# Patient Record
Sex: Male | Born: 1960 | Race: Black or African American | Hispanic: No | Marital: Married | Smoking: Current every day smoker
Health system: Southern US, Community
[De-identification: ages and names within clinical notes are randomized; demographics above are authoritative.]

## PROBLEM LIST (undated history)

## (undated) DIAGNOSIS — F1021 Alcohol dependence, in remission: Secondary | ICD-10-CM

## (undated) DIAGNOSIS — M1990 Unspecified osteoarthritis, unspecified site: Secondary | ICD-10-CM

## (undated) DIAGNOSIS — I499 Cardiac arrhythmia, unspecified: Secondary | ICD-10-CM

## (undated) DIAGNOSIS — R918 Other nonspecific abnormal finding of lung field: Secondary | ICD-10-CM

## (undated) DIAGNOSIS — Z720 Tobacco use: Secondary | ICD-10-CM

## (undated) DIAGNOSIS — I4892 Unspecified atrial flutter: Secondary | ICD-10-CM

## (undated) HISTORY — DX: Alcohol dependence, in remission: F10.21

## (undated) HISTORY — DX: Other nonspecific abnormal finding of lung field: R91.8

## (undated) HISTORY — DX: Tobacco use: Z72.0

## (undated) HISTORY — DX: Unspecified atrial flutter: I48.92

---

## 2008-04-30 ENCOUNTER — Emergency Department (HOSPITAL_COMMUNITY): Admission: EM | Admit: 2008-04-30 | Discharge: 2008-04-30 | Payer: Self-pay | Admitting: Emergency Medicine

## 2008-04-30 IMAGING — CR DG FINGER INDEX 2+V*R*
1 series · 1 of 1 positions shown · non-contrast
Comparison: None

CLINICAL DATA: Laceration

RIGHT INDEX FINGER 2+V

[view not recorded]
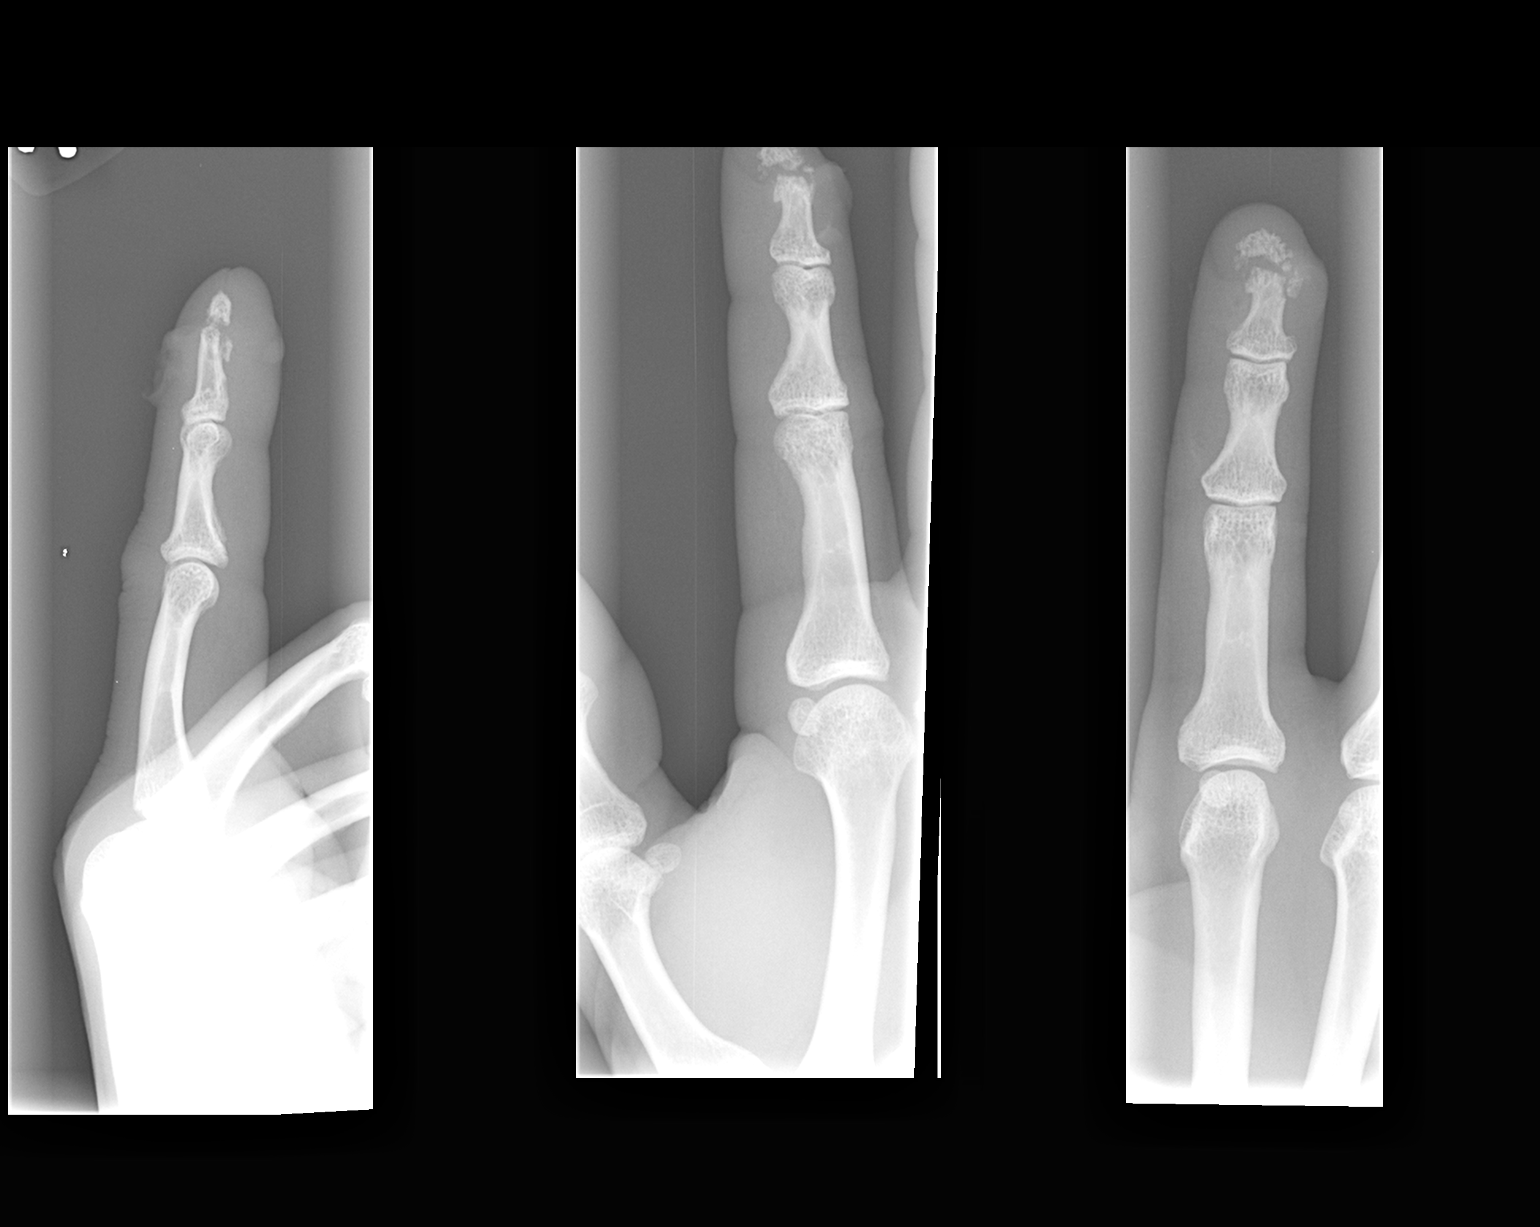

[1 of 1 positions shown; findings below may reference images not displayed]

FINDINGS: There is a comminuted distal tuft fracture and associated
soft tissue injury.  The joint spaces are maintained.
IMPRESSION: Comminuted distal tuft fracture.

## 2008-09-18 ENCOUNTER — Ambulatory Visit (HOSPITAL_COMMUNITY): Admission: RE | Admit: 2008-09-18 | Discharge: 2008-09-18 | Payer: Self-pay | Admitting: Family Medicine

## 2008-09-18 IMAGING — CR DG CHEST 2V
2 series · 2 of 2 positions shown · non-contrast
Comparison: None

CLINICAL DATA: Chronic tobacco use

CHEST - 2 VIEW

[view not recorded (1 of 2)]
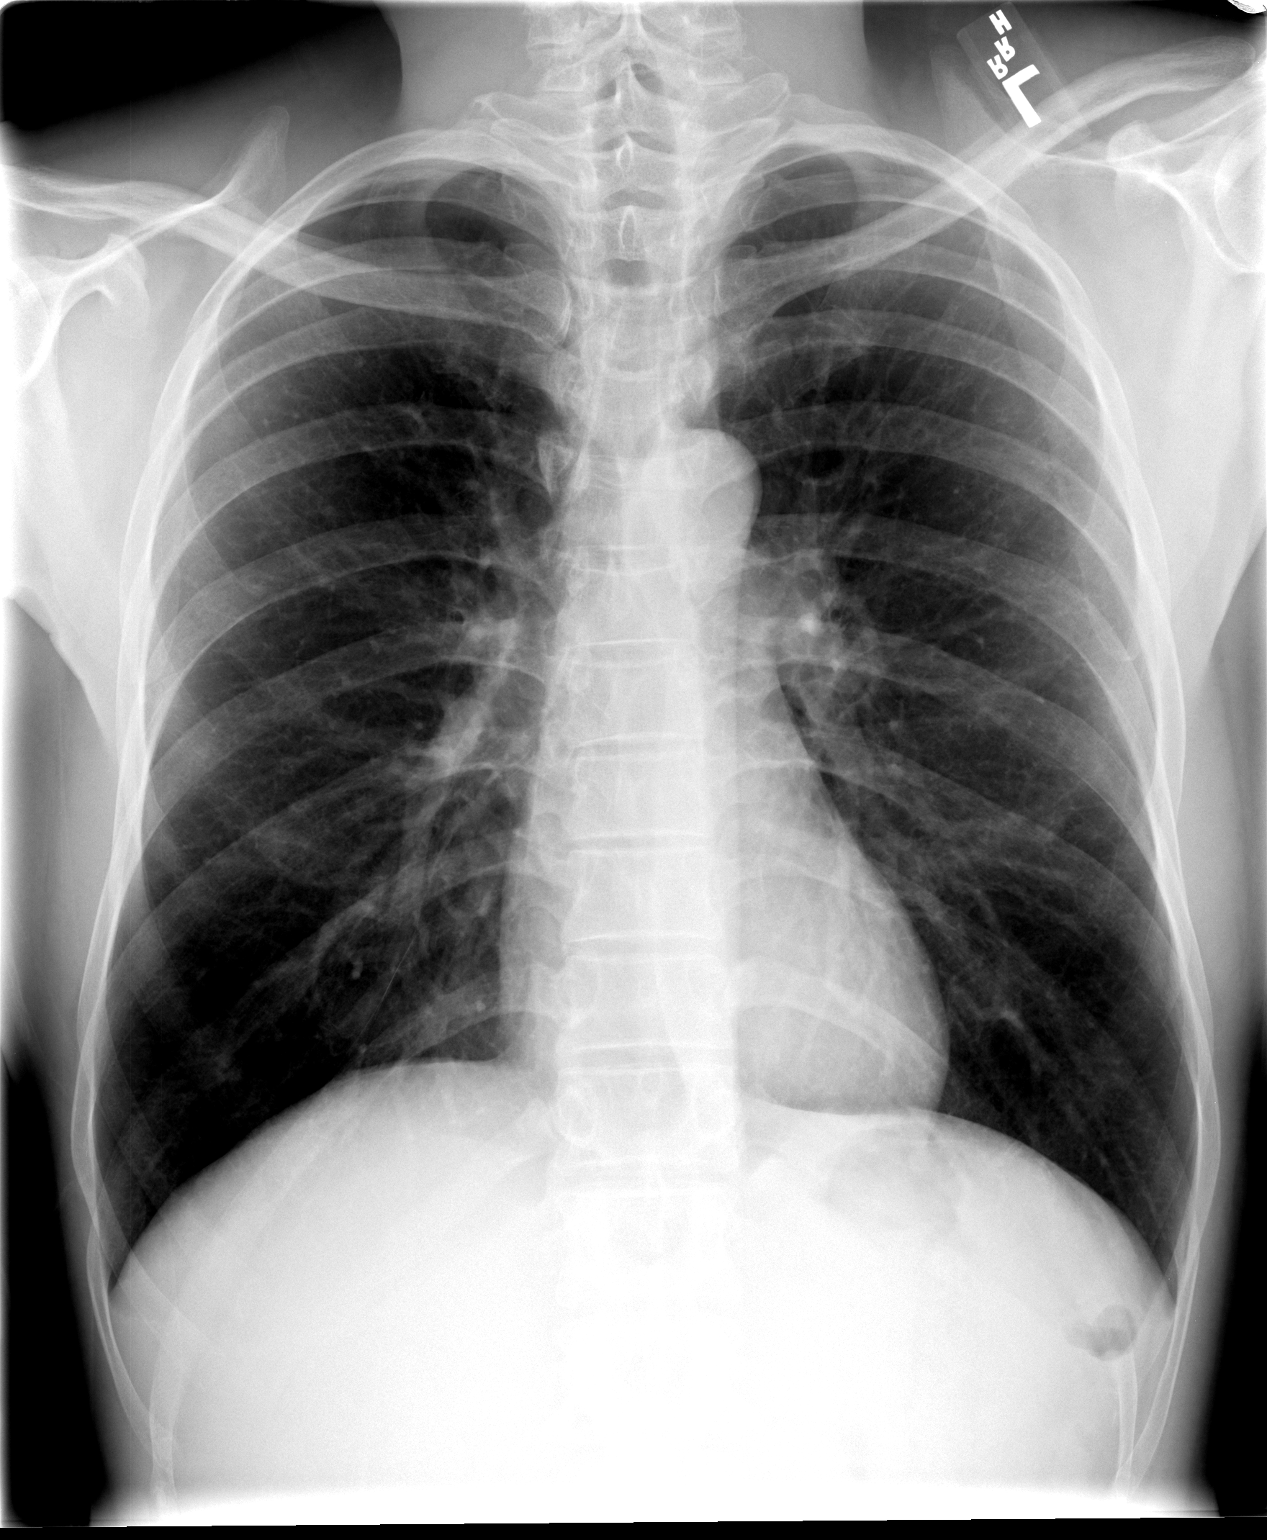

[view not recorded (2 of 2)]
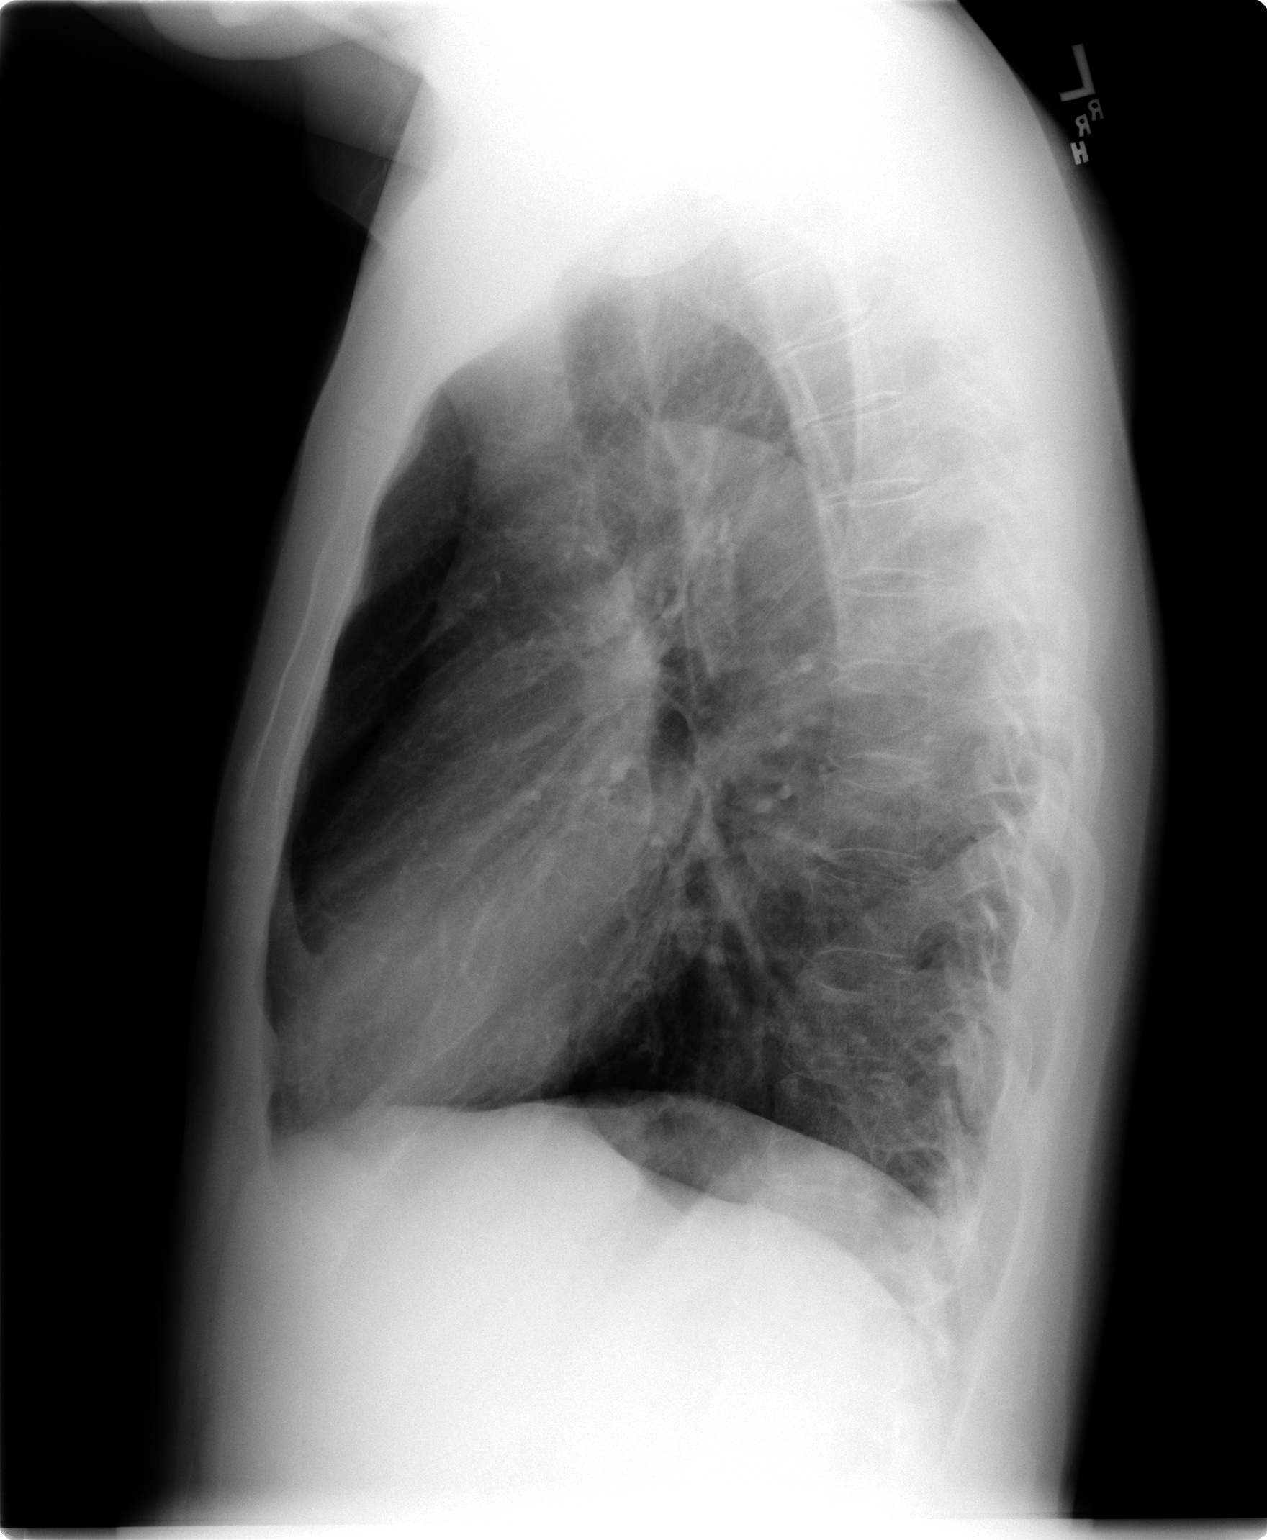

[2 of 2 positions shown; findings below may reference images not displayed]

FINDINGS: Normal heart size, mediastinal contours, and pulmonary vascularity.
Question bilateral nipple shadows.
Bronchitic and minimal emphysematous changes suggesting early COPD.
No pulmonary infiltrate or pleural effusion.
Bones unremarkable.
IMPRESSION: Question early COPD.
Question bilateral nipple shadows, recommend repeat PA chest
radiograph with nipple markers to confirm, in order to exclude
pulmonary nodule.

## 2008-09-28 ENCOUNTER — Ambulatory Visit (HOSPITAL_COMMUNITY): Admission: RE | Admit: 2008-09-28 | Discharge: 2008-09-28 | Payer: Self-pay | Admitting: Family Medicine

## 2008-09-28 IMAGING — CR DG CHEST 1V
1 series · 1 of 1 positions shown · non-contrast
Comparison: Chest radiograph [DATE]

CLINICAL DATA: Evaluate for pulmonary nodules.

CHEST - 1 VIEW

[view not recorded]
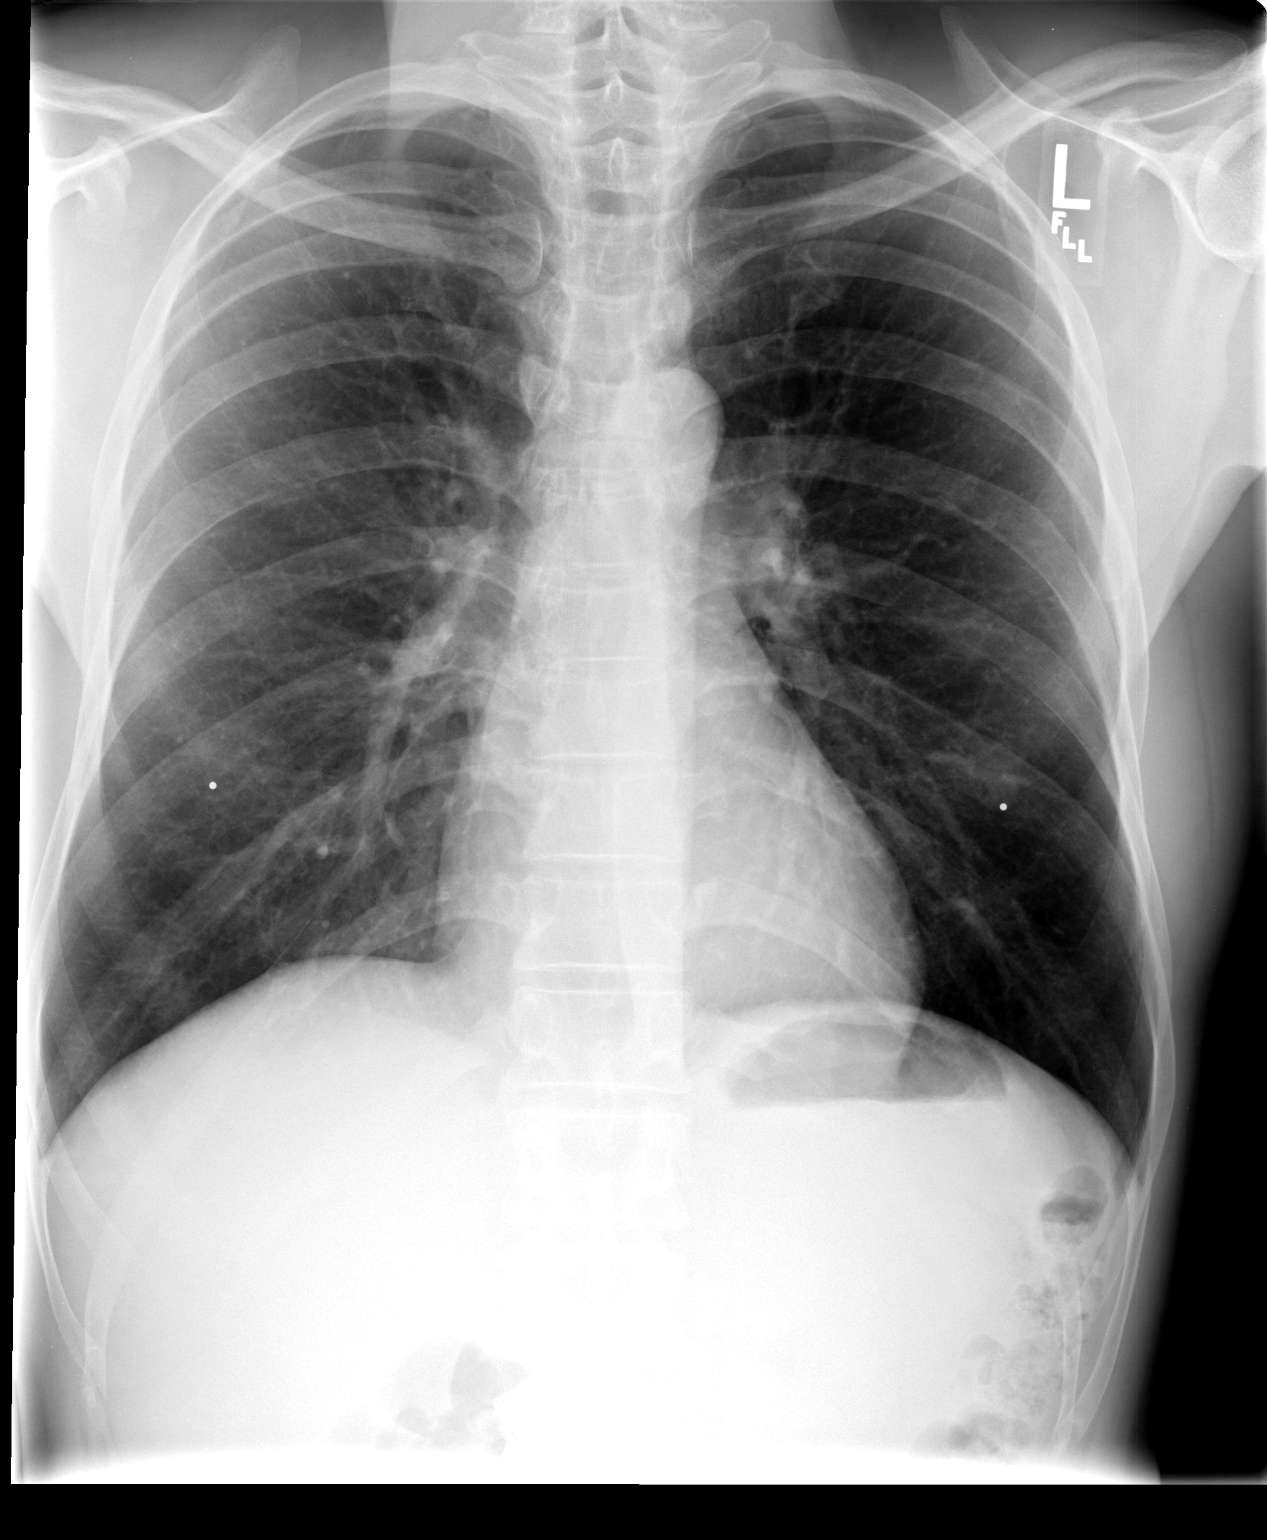

[1 of 1 positions shown; findings below may reference images not displayed]

FINDINGS: Frontal radiograph is performed with nipple markers.
Nipple marker location corresponds to the densities identified on
comparison chest radiograph.

Normal mediastinum and cardiac silhouette.  Costophrenic angles are
clear.  No evidence effusion, infiltrate, or pneumothorax.
IMPRESSION: Nipple shadows identified by nipple markers.

## 2009-04-06 ENCOUNTER — Emergency Department (HOSPITAL_COMMUNITY): Admission: EM | Admit: 2009-04-06 | Discharge: 2009-04-06 | Payer: Self-pay | Admitting: Emergency Medicine

## 2010-01-01 ENCOUNTER — Emergency Department (HOSPITAL_COMMUNITY): Admission: EM | Admit: 2010-01-01 | Discharge: 2010-01-01 | Payer: Self-pay | Admitting: Emergency Medicine

## 2010-08-17 LAB — RAPID URINE DRUG SCREEN, HOSP PERFORMED
Benzodiazepines: NOT DETECTED
Cocaine: NOT DETECTED
Opiates: NOT DETECTED
Tetrahydrocannabinol: NOT DETECTED

## 2010-08-17 LAB — ETHANOL: Alcohol, Ethyl (B): 231 mg/dL — ABNORMAL HIGH (ref 0–10)

## 2013-05-31 ENCOUNTER — Emergency Department (HOSPITAL_COMMUNITY)
Admission: EM | Admit: 2013-05-31 | Discharge: 2013-05-31 | Disposition: A | Discharge status: Home / Self Care | Payer: 59 | Attending: Emergency Medicine | Admitting: Emergency Medicine

## 2013-05-31 ENCOUNTER — Emergency Department (HOSPITAL_COMMUNITY): Payer: 59

## 2013-05-31 ENCOUNTER — Encounter (HOSPITAL_COMMUNITY): Payer: Self-pay | Admitting: Emergency Medicine

## 2013-05-31 DIAGNOSIS — Z79899 Other long term (current) drug therapy: Secondary | ICD-10-CM | POA: Insufficient documentation

## 2013-05-31 DIAGNOSIS — J40 Bronchitis, not specified as acute or chronic: Secondary | ICD-10-CM

## 2013-05-31 DIAGNOSIS — F17200 Nicotine dependence, unspecified, uncomplicated: Secondary | ICD-10-CM | POA: Insufficient documentation

## 2013-05-31 DIAGNOSIS — J209 Acute bronchitis, unspecified: Secondary | ICD-10-CM | POA: Insufficient documentation

## 2013-05-31 IMAGING — CR DG CHEST 2V
2 series · 2 of 2 positions shown · non-contrast
Comparison: Chest x-ray [DATE].

CLINICAL DATA: Productive cough and fever for the past 4 days.
Shortness of breath.

EXAM:
CHEST  2 VIEW

[view not recorded (1 of 2)]
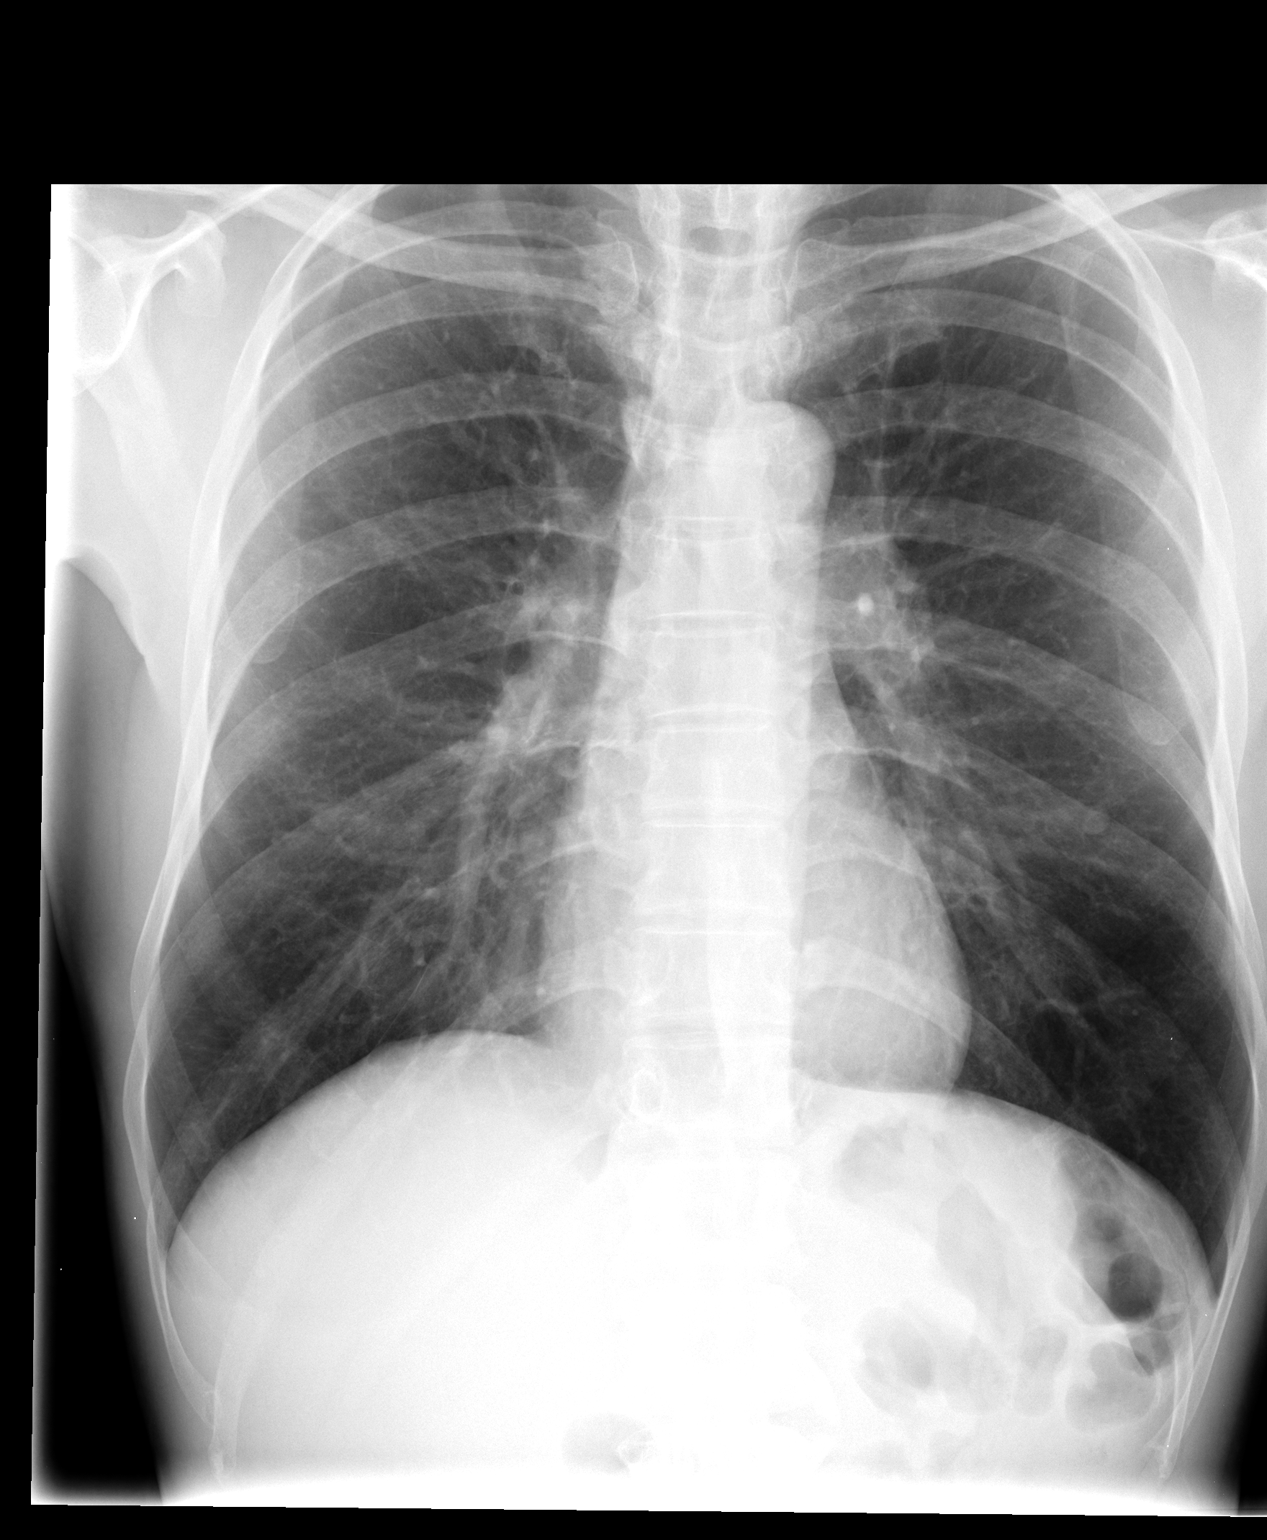

[view not recorded (2 of 2)]
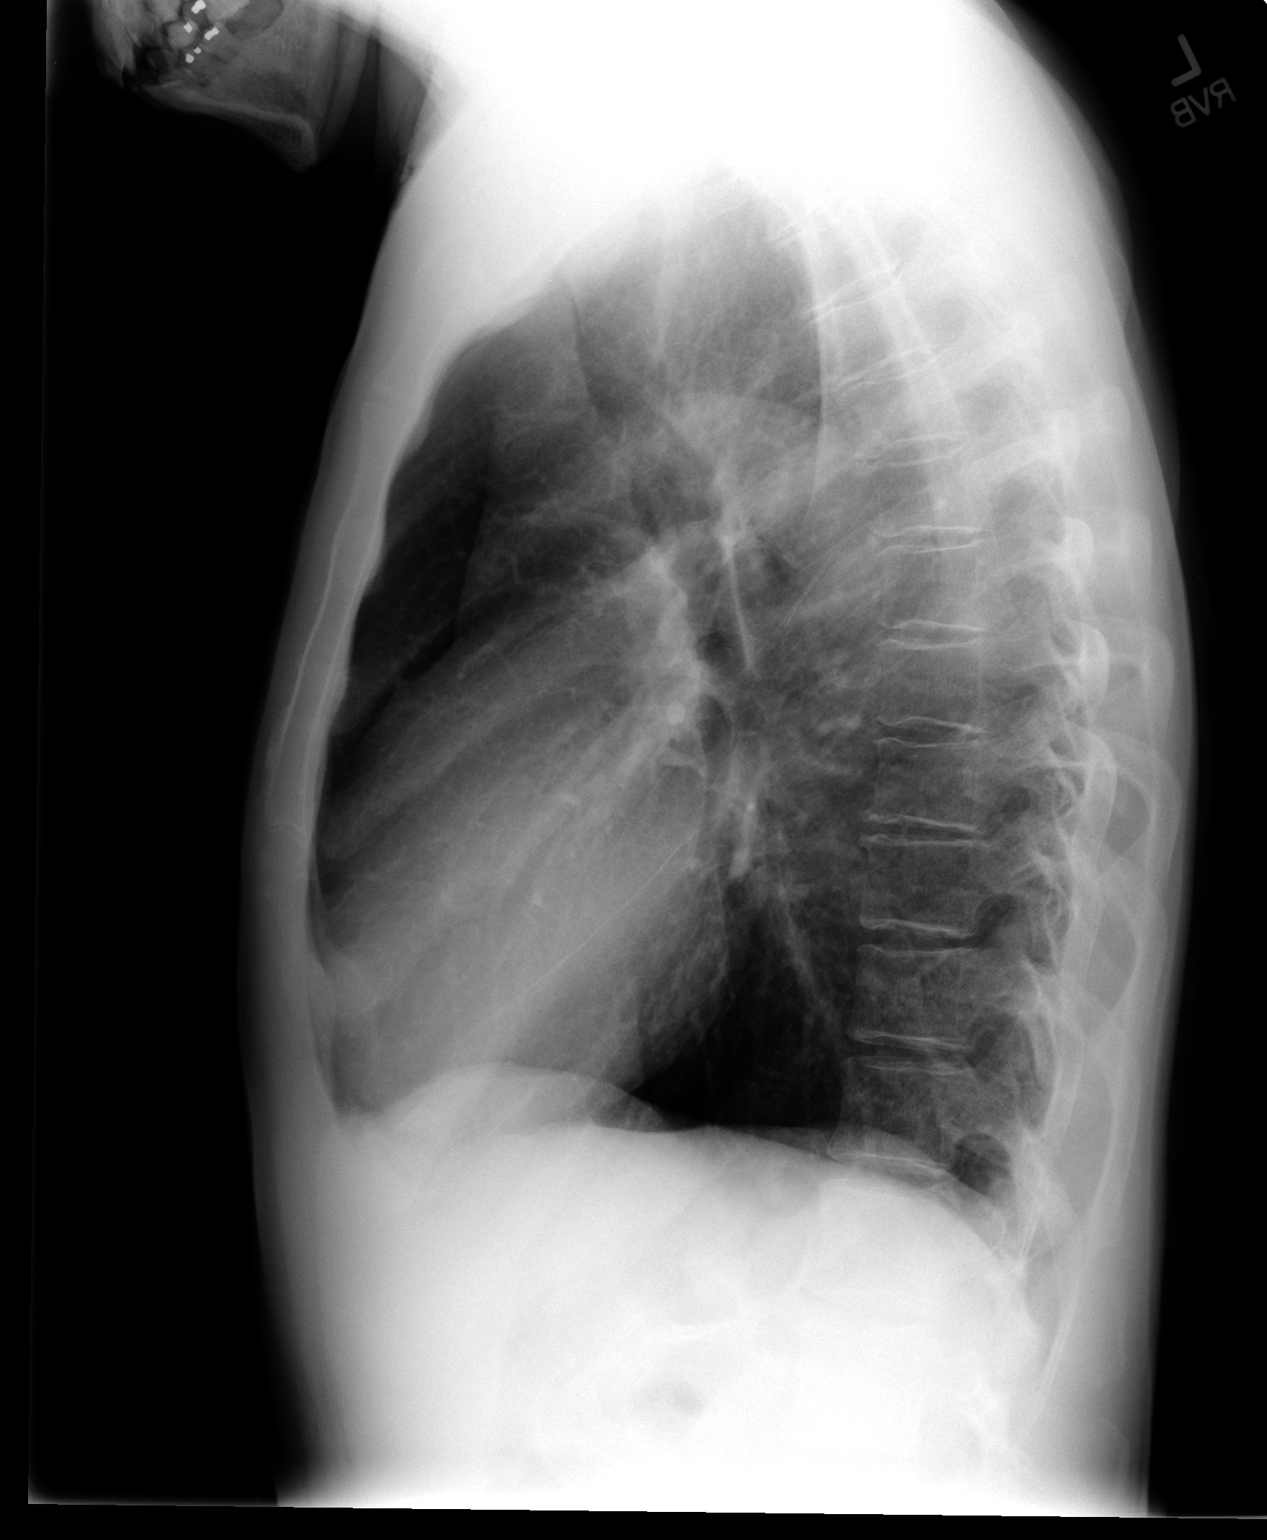

[2 of 2 positions shown; findings below may reference images not displayed]

FINDINGS: Lung volumes are normal. No consolidative airspace disease. No
pleural effusions. No pneumothorax. No pulmonary nodule or mass
noted. Pulmonary vasculature and the cardiomediastinal silhouette
are within normal limits.
IMPRESSION: 1.  No radiographic evidence of acute cardiopulmonary disease.

## 2013-05-31 MED ORDER — BENZONATATE 100 MG PO CAPS: 100.0000 mg | ORAL_CAPSULE | Freq: Three times a day (TID) | ORAL | Status: DC

## 2013-05-31 MED ORDER — AZITHROMYCIN 250 MG PO TABS
ORAL_TABLET | ORAL | Status: DC
Start: 2013-05-31 — End: 2017-12-10

## 2013-05-31 NOTE — ED Notes (Signed)
C/o productive cough, chills, runny nose x 4 days.

## 2013-05-31 NOTE — ED Provider Notes (Signed)
CSN: [id number]     Arrival date & time 05/31/13  1232 History   First MD Initiated Contact with Patient 05/31/13 1401     Chief Complaint  Patient presents with  . Cough   (Consider location/radiation/quality/duration/timing/severity/associated sxs/prior Treatment) Patient is a 53 y.o. male presenting with cough. The history is provided by the patient. No language interpreter was used.  Cough Cough characteristics:  Productive Sputum characteristics:  Nondescript Severity:  Moderate Onset quality:  Sudden Duration:  4 days Timing:  Constant Progression:  Worsening Chronicity:  New Smoker: yes   Relieved by:  Nothing Worsened by:  Nothing tried Ineffective treatments:  None tried Associated symptoms: chest pain, fever and sore throat     History reviewed. No pertinent past medical history. History reviewed. No pertinent past surgical history. No family history on file. History  Substance Use Topics  . Smoking status: Current Every Day Smoker -- 0.50 packs/day    Types: Cigarettes  . Smokeless tobacco: Not on file  . Alcohol Use: Yes     Comment: beer every day    Review of Systems  Constitutional: Positive for fever.  HENT: Positive for sore throat.   Respiratory: Positive for cough.   Cardiovascular: Positive for chest pain.  All other systems reviewed and are negative.    Allergies  Review of patient's allergies indicates no known allergies.  Home Medications   Current Outpatient Rx  Name  Route  Sig  Dispense  Refill  . DM-Phenylephrine-Acetaminophen (ALKA-SELTZER PLS SINUS & COUGH PO)   Oral   Take 1 tablet by mouth daily as needed (cold symptoms).          BP 133/83  Pulse 101  Temp(Src) 99.1 F (37.3 C) (Oral)  Resp 18  Ht 5\' 11"  (1.803 m)  Wt 145 lb (65.772 kg)  BMI 20.23 kg/m2  SpO2 95% Physical Exam  Nursing note and vitals reviewed. Constitutional: He is oriented to person, place, and time. He appears well-developed and well-nourished.   HENT:  Head: Normocephalic and atraumatic.  Right Ear: External ear normal.  Left Ear: External ear normal.  Nose: Nose normal.  Mouth/Throat: Oropharynx is clear and moist.  Eyes: Conjunctivae and EOM are normal. Pupils are equal, round, and reactive to light.  Neck: Normal range of motion. Neck supple.  Cardiovascular: Normal rate and normal heart sounds.   Pulmonary/Chest: Effort normal and breath sounds normal.  Abdominal: Soft.  Musculoskeletal: Normal range of motion.  Neurological: He is alert and oriented to person, place, and time. He has normal reflexes.  Skin: Skin is warm.  Psychiatric: He has a normal mood and affect.    ED Course  Procedures (including critical care time) Labs Review Labs Reviewed - No data to display Imaging Review Dg Chest 2 View  05/31/2013   CLINICAL DATA:  Productive cough and fever for the past 4 days. Shortness of breath.  EXAM: CHEST  2 VIEW  COMPARISON:  Chest x-ray 09/28/2008.  FINDINGS: Lung volumes are normal. No consolidative airspace disease. No pleural effusions. No pneumothorax. No pulmonary nodule or mass noted. Pulmonary vasculature and the cardiomediastinal silhouette are within normal limits.  IMPRESSION: 1.  No radiographic evidence of acute cardiopulmonary disease.   Electronically Signed   By: Vinnie Langton M.D.   On: 05/31/2013 14:57    EKG Interpretation   None       MDM   1. Bronchitis    zithromax and [place], PA-C  05/31/13 1511 

## 2013-05-31 NOTE — Progress Notes (Signed)
  ED/CM noted patient did not have health insurance and/or PCP listed in the computer.  Patient was given the [facility] with information on the clinics, food pantries, and the handout for new health insurance sign-up. Also provided a drug discount card for prescriptions.  Patient expressed appreciation for information received.

## 2013-05-31 NOTE — ED Provider Notes (Signed)
Medical screening examination/treatment/procedure(s) were performed by non-physician practitioner and as supervising physician I was immediately available for consultation/collaboration.  EKG Interpretation   None        Nat Christen, MD 05/31/13 1545

## 2013-05-31 NOTE — Discharge Instructions (Signed)

## 2017-12-10 ENCOUNTER — Other Ambulatory Visit: Payer: Self-pay

## 2017-12-10 ENCOUNTER — Encounter (HOSPITAL_COMMUNITY): Payer: Self-pay | Admitting: *Deleted

## 2017-12-10 ENCOUNTER — Emergency Department (HOSPITAL_COMMUNITY): Payer: BLUE CROSS/BLUE SHIELD

## 2017-12-10 ENCOUNTER — Inpatient Hospital Stay (HOSPITAL_COMMUNITY)
Admission: EM | Admit: 2017-12-10 | Discharge: 2017-12-17 | DRG: 872 | Disposition: A | Discharge status: Home / Self Care | Payer: BLUE CROSS/BLUE SHIELD | Attending: Family Medicine | Admitting: Family Medicine

## 2017-12-10 DIAGNOSIS — Z8249 Family history of ischemic heart disease and other diseases of the circulatory system: Secondary | ICD-10-CM | POA: Diagnosis not present

## 2017-12-10 DIAGNOSIS — F17200 Nicotine dependence, unspecified, uncomplicated: Secondary | ICD-10-CM | POA: Diagnosis present

## 2017-12-10 DIAGNOSIS — E869 Volume depletion, unspecified: Secondary | ICD-10-CM | POA: Diagnosis not present

## 2017-12-10 DIAGNOSIS — I4891 Unspecified atrial fibrillation: Secondary | ICD-10-CM | POA: Diagnosis not present

## 2017-12-10 DIAGNOSIS — J439 Emphysema, unspecified: Secondary | ICD-10-CM | POA: Diagnosis present

## 2017-12-10 DIAGNOSIS — R790 Abnormal level of blood mineral: Secondary | ICD-10-CM | POA: Diagnosis present

## 2017-12-10 DIAGNOSIS — F17210 Nicotine dependence, cigarettes, uncomplicated: Secondary | ICD-10-CM | POA: Diagnosis not present

## 2017-12-10 DIAGNOSIS — R55 Syncope and collapse: Secondary | ICD-10-CM | POA: Diagnosis not present

## 2017-12-10 DIAGNOSIS — R739 Hyperglycemia, unspecified: Secondary | ICD-10-CM | POA: Diagnosis present

## 2017-12-10 DIAGNOSIS — E860 Dehydration: Secondary | ICD-10-CM | POA: Diagnosis present

## 2017-12-10 DIAGNOSIS — Z681 Body mass index (BMI) 19 or less, adult: Secondary | ICD-10-CM

## 2017-12-10 DIAGNOSIS — J209 Acute bronchitis, unspecified: Secondary | ICD-10-CM | POA: Diagnosis not present

## 2017-12-10 DIAGNOSIS — F10231 Alcohol dependence with withdrawal delirium: Secondary | ICD-10-CM | POA: Diagnosis not present

## 2017-12-10 DIAGNOSIS — F10239 Alcohol dependence with withdrawal, unspecified: Secondary | ICD-10-CM | POA: Diagnosis not present

## 2017-12-10 DIAGNOSIS — R9431 Abnormal electrocardiogram [ECG] [EKG]: Secondary | ICD-10-CM | POA: Diagnosis present

## 2017-12-10 DIAGNOSIS — I4892 Unspecified atrial flutter: Secondary | ICD-10-CM | POA: Diagnosis present

## 2017-12-10 DIAGNOSIS — Z720 Tobacco use: Secondary | ICD-10-CM | POA: Diagnosis present

## 2017-12-10 DIAGNOSIS — R918 Other nonspecific abnormal finding of lung field: Secondary | ICD-10-CM | POA: Diagnosis not present

## 2017-12-10 DIAGNOSIS — Z800 Family history of malignant neoplasm of digestive organs: Secondary | ICD-10-CM | POA: Diagnosis not present

## 2017-12-10 DIAGNOSIS — I6789 Other cerebrovascular disease: Secondary | ICD-10-CM | POA: Diagnosis not present

## 2017-12-10 DIAGNOSIS — R000 Tachycardia, unspecified: Secondary | ICD-10-CM | POA: Diagnosis not present

## 2017-12-10 DIAGNOSIS — F10931 Alcohol use, unspecified with withdrawal delirium: Secondary | ICD-10-CM | POA: Diagnosis not present

## 2017-12-10 DIAGNOSIS — A419 Sepsis, unspecified organism: Secondary | ICD-10-CM | POA: Diagnosis not present

## 2017-12-10 DIAGNOSIS — Z781 Physical restraint status: Secondary | ICD-10-CM | POA: Diagnosis not present

## 2017-12-10 DIAGNOSIS — F10939 Alcohol use, unspecified with withdrawal, unspecified: Secondary | ICD-10-CM | POA: Diagnosis not present

## 2017-12-10 LAB — CBC WITH DIFFERENTIAL/PLATELET
BASOS ABS: 0.1 10*3/uL (ref 0.0–0.1)
BASOS PCT: 1 %
EOS PCT: 1 %
Eosinophils Absolute: 0.1 10*3/uL (ref 0.0–0.7)
HEMATOCRIT: 39.2 % (ref 39.0–52.0)
Hemoglobin: 13.1 g/dL (ref 13.0–17.0)
Lymphocytes Relative: 19 %
Lymphs Abs: 1.6 10*3/uL (ref 0.7–4.0)
MCH: 31.7 pg (ref 26.0–34.0)
MCHC: 33.4 g/dL (ref 30.0–36.0)
MCV: 94.9 fL (ref 78.0–100.0)
MONO ABS: 0.4 10*3/uL (ref 0.1–1.0)
MONOS PCT: 5 %
Neutro Abs: 6.5 10*3/uL (ref 1.7–7.7)
Neutrophils Relative %: 74 %
PLATELETS: 272 10*3/uL (ref 150–400)
RBC: 4.13 MIL/uL — ABNORMAL LOW (ref 4.22–5.81)
RDW: 15.1 % (ref 11.5–15.5)
WBC: 8.7 10*3/uL (ref 4.0–10.5)

## 2017-12-10 LAB — URINALYSIS, ROUTINE W REFLEX MICROSCOPIC
Bacteria, UA: NONE SEEN
Bilirubin Urine: NEGATIVE
Glucose, UA: 50 mg/dL — AB
Ketones, ur: NEGATIVE mg/dL
LEUKOCYTES UA: NEGATIVE
Nitrite: NEGATIVE
PH: 7 (ref 5.0–8.0)
Protein, ur: 100 mg/dL — AB
Specific Gravity, Urine: 1.005 (ref 1.005–1.030)

## 2017-12-10 LAB — TROPONIN I: Troponin I: <0.03 ng/mL (ref <0.03)

## 2017-12-10 LAB — COMPREHENSIVE METABOLIC PANEL
ALBUMIN: 3.7 g/dL (ref 3.5–5.0)
ALK PHOS: 146 U/L — AB (ref 38–126)
ALT: 33 U/L (ref 0–44)
AST: 46 U/L — AB (ref 15–41)
Anion gap: 15 (ref 5–15)
BILIRUBIN TOTAL: 0.7 mg/dL (ref 0.3–1.2)
BUN: 9 mg/dL (ref 6–20)
CALCIUM: 8.8 mg/dL — AB (ref 8.9–10.3)
CO2: 23 mmol/L (ref 22–32)
CREATININE: 1.12 mg/dL (ref 0.61–1.24)
Chloride: 97 mmol/L — ABNORMAL LOW (ref 98–111)
GFR calc Af Amer: >60 mL/min (ref >60)
GFR calc non Af Amer: >60 mL/min (ref >60)
GLUCOSE: 196 mg/dL — AB (ref 70–99)
Potassium: 3.6 mmol/L (ref 3.5–5.1)
SODIUM: 135 mmol/L (ref 135–145)
TOTAL PROTEIN: 7.1 g/dL (ref 6.5–8.1)

## 2017-12-10 LAB — HEPARIN LEVEL (UNFRACTIONATED): HEPARIN UNFRACTIONATED: 0.11 [IU]/mL — AB (ref 0.30–0.70)

## 2017-12-10 LAB — APTT: APTT: 32 s (ref 24–36)

## 2017-12-10 LAB — PROTIME-INR
INR: 0.96
PROTHROMBIN TIME: 12.7 s (ref 11.4–15.2)

## 2017-12-10 LAB — MAGNESIUM: Magnesium: 1.6 mg/dL — ABNORMAL LOW (ref 1.7–2.4)

## 2017-12-10 LAB — T4, FREE: Free T4: 0.6 ng/dL — ABNORMAL LOW (ref 0.82–1.77)

## 2017-12-10 LAB — I-STAT CG4 LACTIC ACID, ED
Lactic Acid, Venous: 5.22 mmol/L (ref 0.5–1.9)
Lactic Acid, Venous: 1.39 mmol/L (ref 0.5–1.9)

## 2017-12-10 LAB — HEMOGLOBIN A1C
HEMOGLOBIN A1C: 5.1 % (ref 4.8–5.6)
Mean Plasma Glucose: 99.67 mg/dL

## 2017-12-10 LAB — TSH: TSH: 2.081 u[IU]/mL (ref 0.350–4.500)

## 2017-12-10 LAB — MRSA PCR SCREENING: MRSA by PCR: NEGATIVE

## 2017-12-10 IMAGING — CR DG CHEST 1V PORT
1 series · 1 of 1 positions shown · non-contrast
Comparison: Chest radiograph [DATE]

CLINICAL DATA: Cough, chills and fever.

EXAM:
PORTABLE CHEST 1 VIEW

[portable]
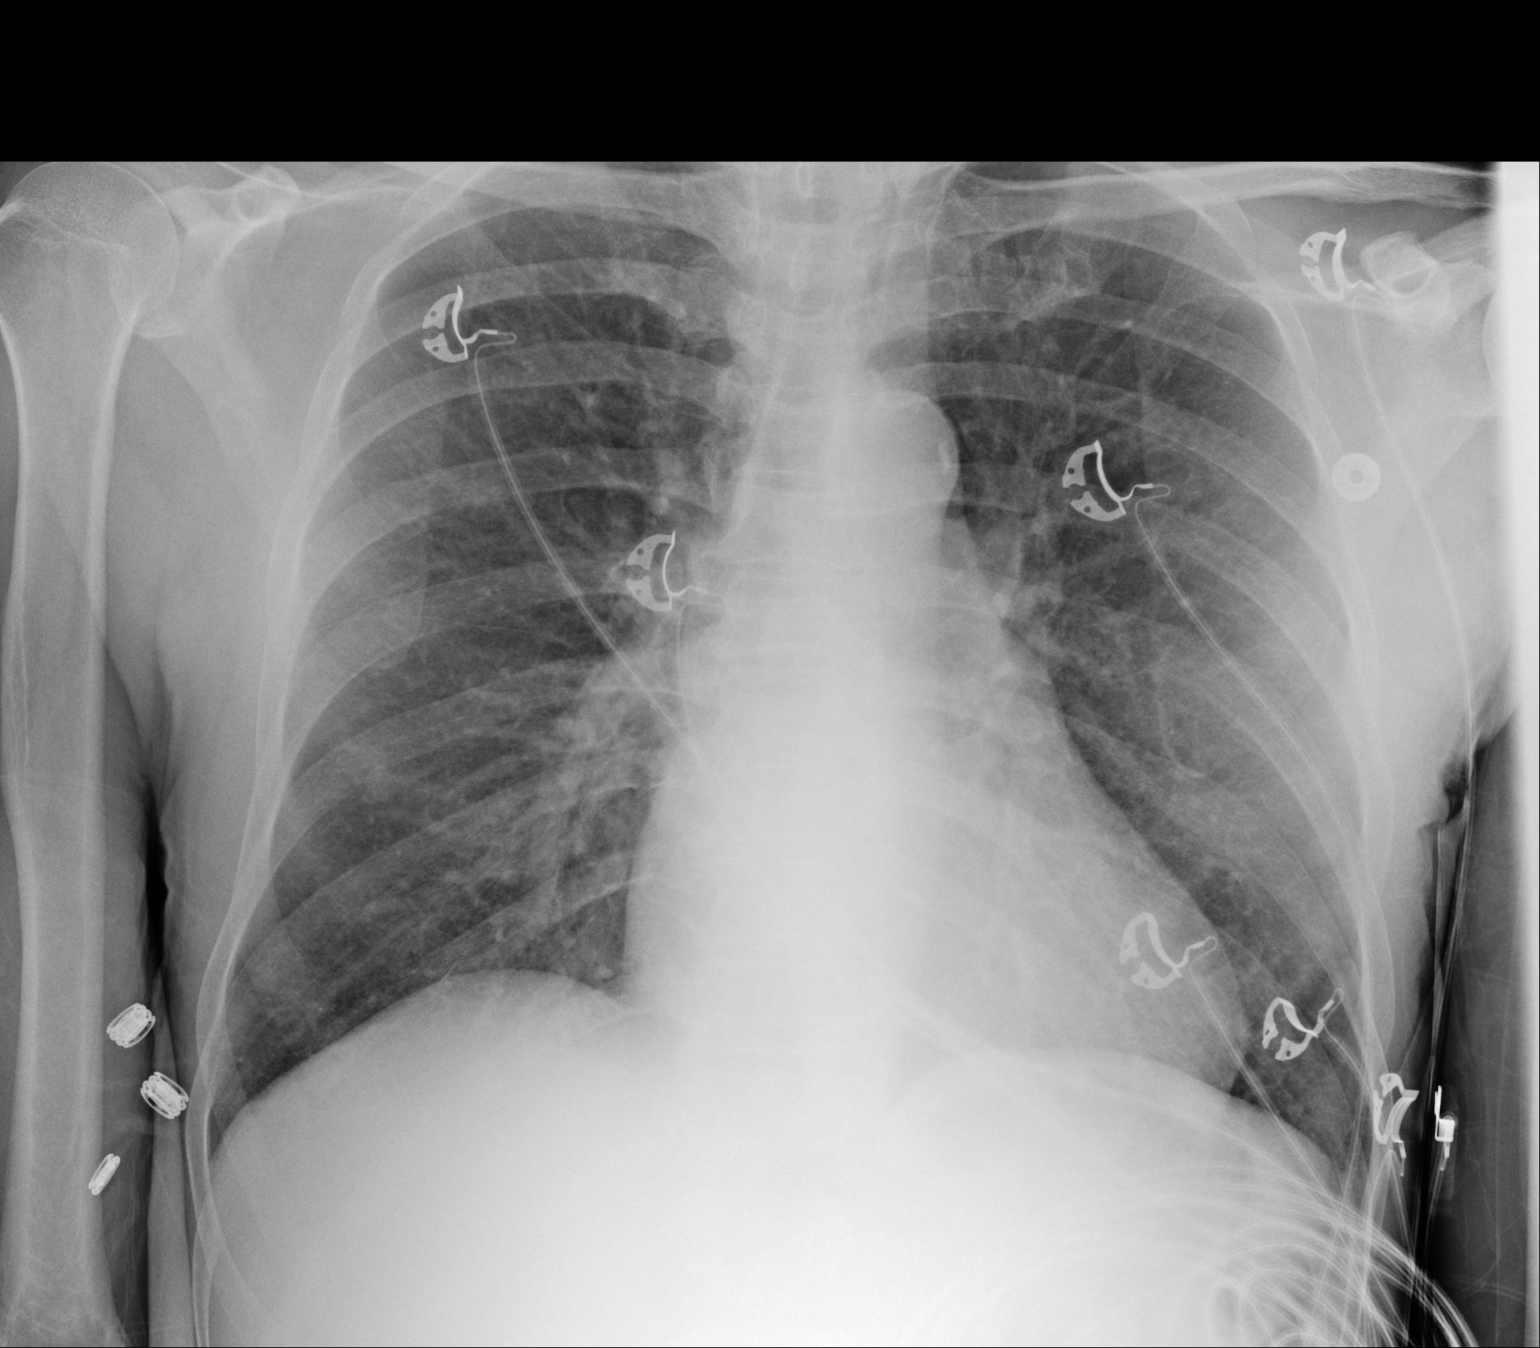

[1 of 1 positions shown; findings below may reference images not displayed]

FINDINGS: Cardiac silhouette is upper limits of normal size. Calcified aortic
arch. No pleural effusion or focal consolidation. No pneumothorax.
Slightly asymmetrically dense LEFT mid lung zone favoring soft
tissue attenuation without focal consolidation or pleural effusion.
No pneumothorax. Soft tissue planes and included osseous structures
are non suspicious.
IMPRESSION: Asymmetric density LEFT mid lung zone favored as soft tissue
attenuation though, recommend follow-up PA and lateral views of the
chest when clinically able.

Borderline cardiomegaly.

Aortic Atherosclerosis ([GE]-[GE]).

## 2017-12-10 IMAGING — CT CT ANGIO CHEST
2 of 6 series · 18 of 46 positions shown · IV contrast (iopamidol)
Comparison: Chest radiographs [1C] hours today and earlier.

CLINICAL DATA: 57-year-old male with fever, chills, productive
cough. Tachycardia. Syncopal episode yesterday.

EXAM:
CT ANGIOGRAPHY CHEST WITH CONTRAST
TECHNIQUE: Multidetector CT imaging of the chest was performed using the
standard protocol during bolus administration of intravenous
contrast. Multiplanar CT image reconstructions and MIPs were
obtained to evaluate the vascular anatomy.
CONTRAST:  100mL [1C] IOPAMIDOL ([1C]) INJECTION 76%

[Series 5: thins · axial · 0.68mm/px · z∈[+1168,+1474]mm · 15 of 335 slices shown]
[im 15/335  lung]
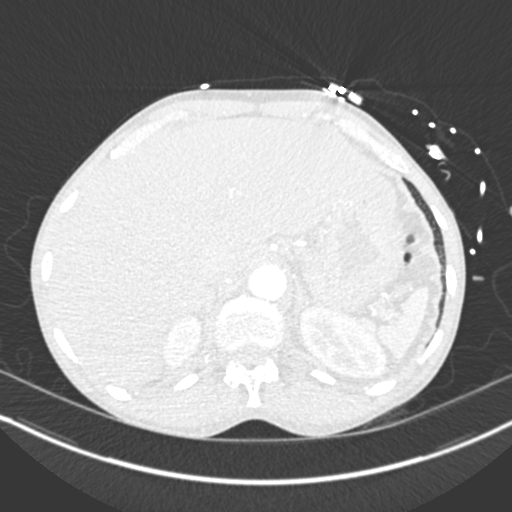
[im 44/335  soft-tissue]
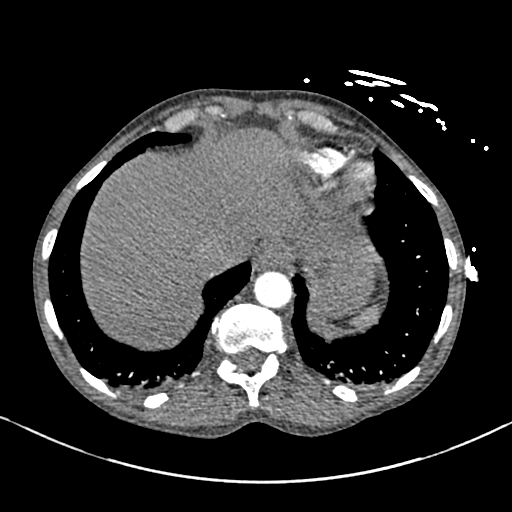
[im 59/335  lung]
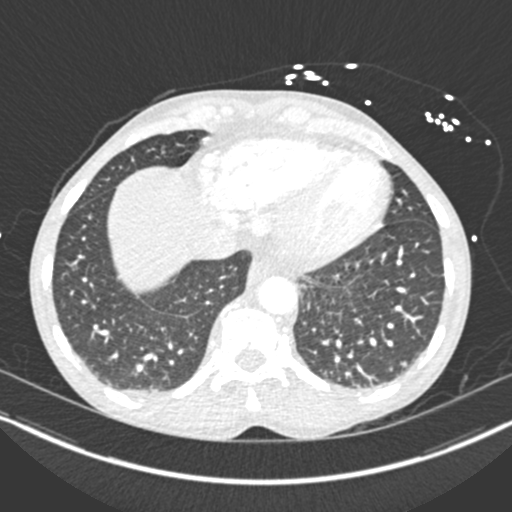
[im 88/335  soft-tissue]
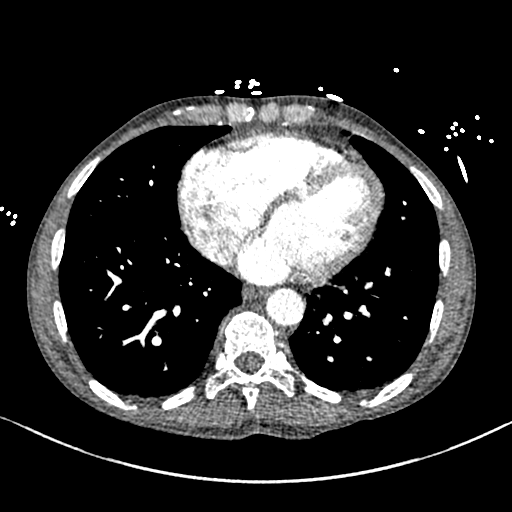
[im 102/335  lung]
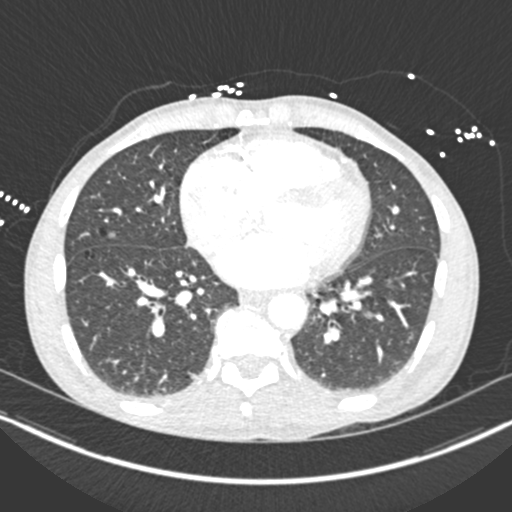
[im 131/335  soft-tissue]
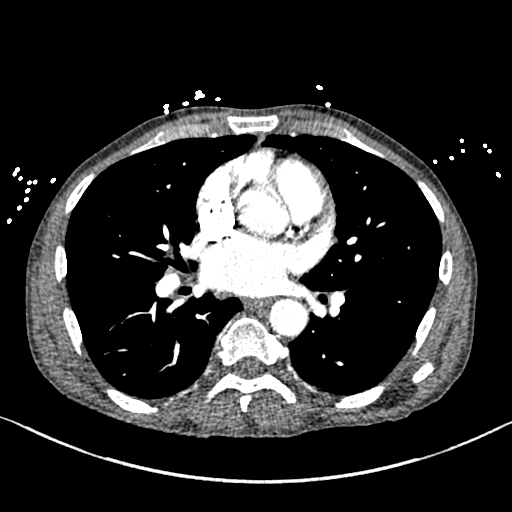
[im 146/335  lung]
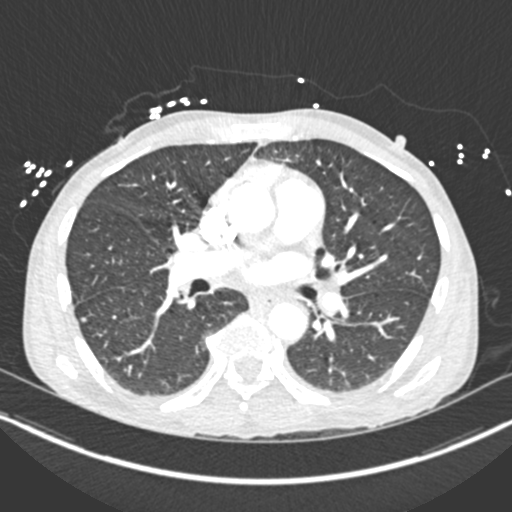
[im 175/335  soft-tissue]
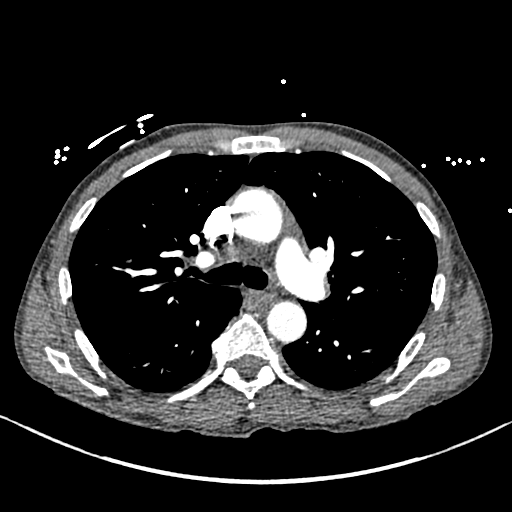
[im 189/335  lung]
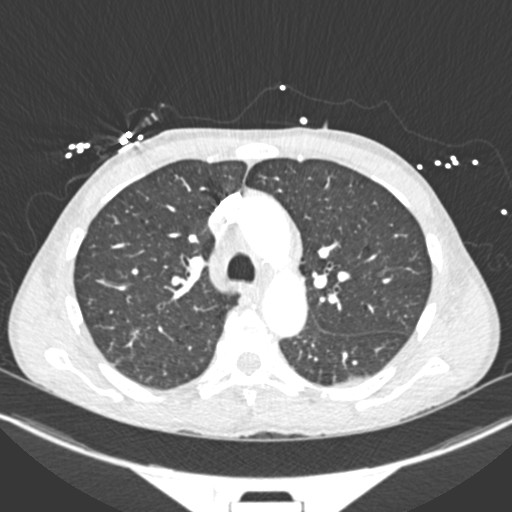
[im 204/335  soft-tissue]
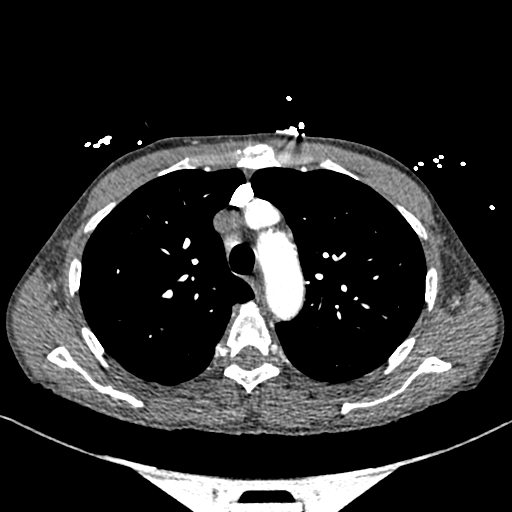
[im 233/335  lung]
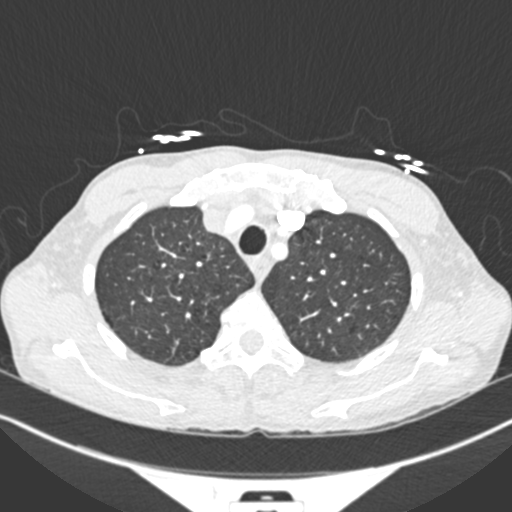
[im 247/335  soft-tissue]
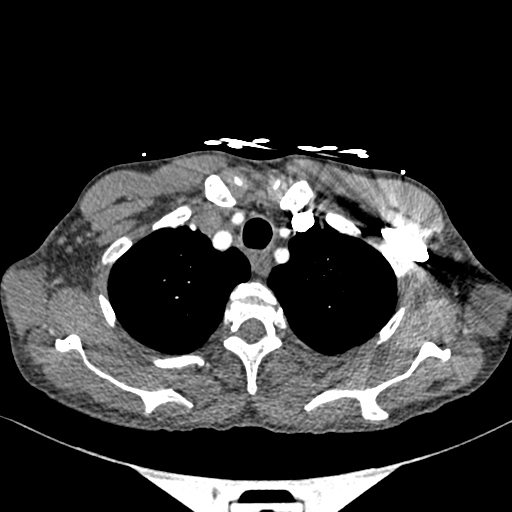
[im 276/335  lung]
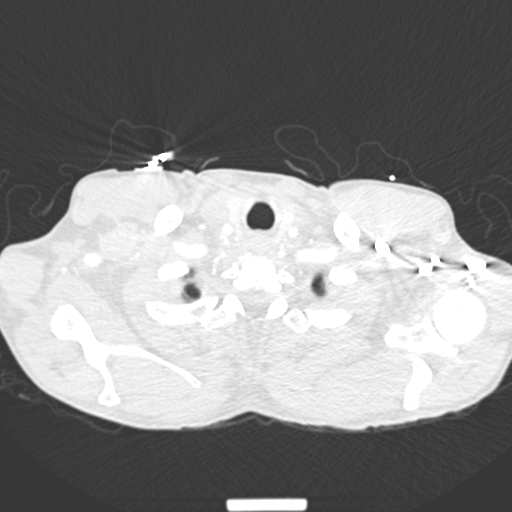
[im 291/335  soft-tissue]
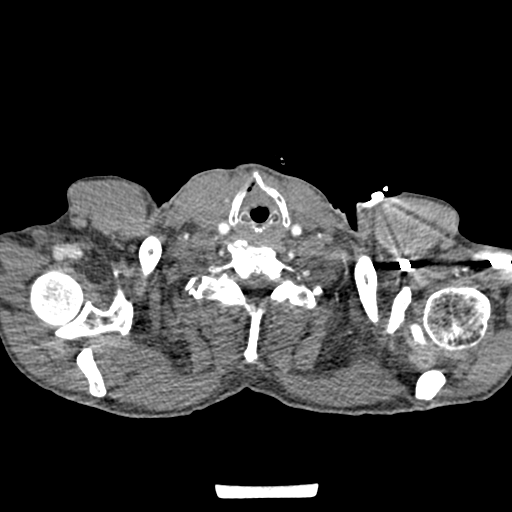
[im 320/335  lung]
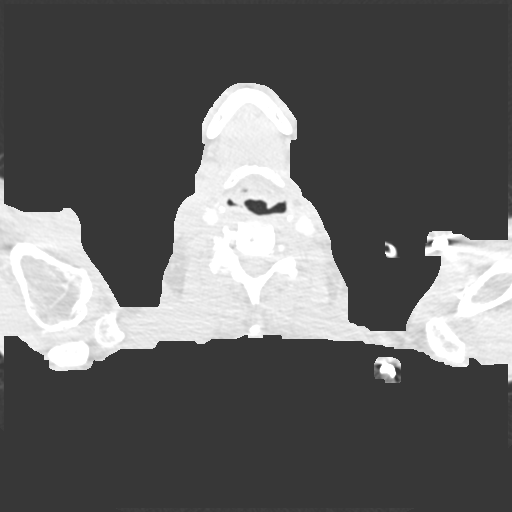

[Series 7: coronal mpr · coronal · 0.65mm/px · 3 of 150 slices shown]
[im 38/150  soft-tissue]
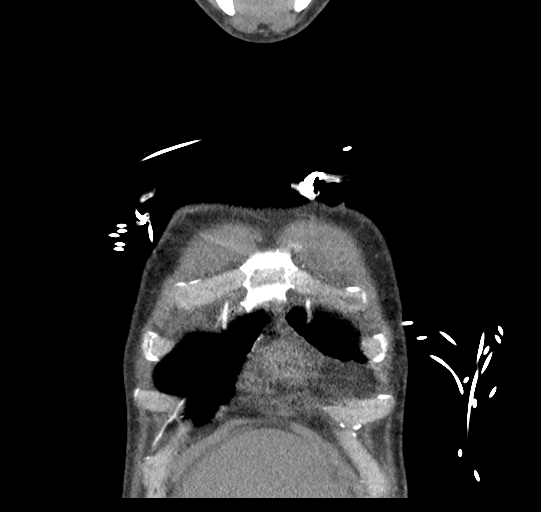
[im 75/150  soft-tissue]
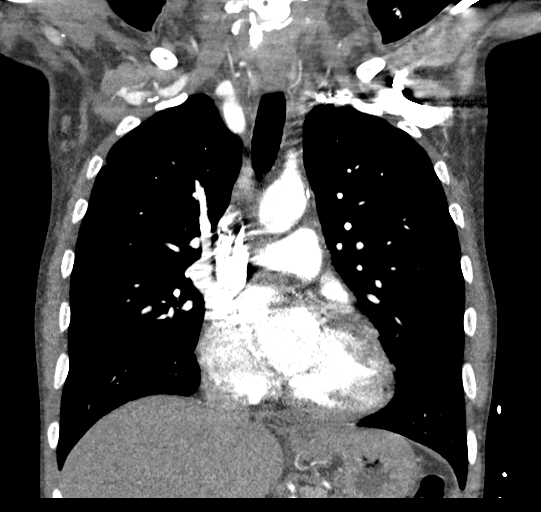
[im 112/150  soft-tissue]
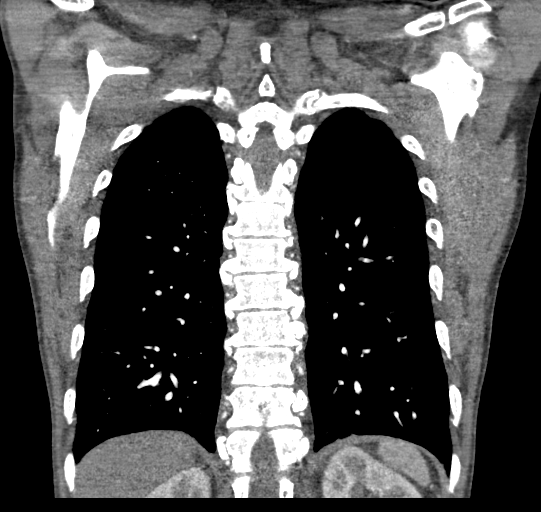

[18 of 46 positions shown; findings below may reference images not displayed]

FINDINGS: Cardiovascular: Good contrast bolus timing in the pulmonary arterial
tree.

No focal filling defect identified in the pulmonary arteries to
suggest acute pulmonary embolism.

Calcified coronary artery atherosclerosis (series 5, image 201).
Mild cardiomegaly. No pericardial effusion. Negative visible aorta
aside from mild calcified plaque.

Mediastinum/Nodes: Negative thoracic inlet. Negative mediastinum. No
lymphadenopathy.

Lungs/Pleura: The major airways are patent.  Centrilobular emphysema

Mild dependent atelectasis in both lungs. Fairly numerous (roughly
21-25) scattered solid and sub solid pulmonary nodules in both
lungs. Many are sub solid and irregular and either peribronchial or
peripheral/subpleural. The largest nodules are 7-8 millimeters
(right upper lobe series 6, image 79 and left lower lobe image 142).
No cavitary nodules.

No consolidation. No pleural effusion or other abnormal pulmonary
opacity.

Upper Abdomen: Negative visible liver, spleen, pancreas, adrenal
glands, kidneys, and bowel in the left upper quadrant.

Musculoskeletal: No acute osseous abnormality identified.

Review of the MIP images confirms the above findings.
IMPRESSION: 1. No evidence of acute pulmonary embolus.
2. Emphysema ([1C]-[1C]) with fairly numerous superimposed
bilateral pulmonary solid and sub solid pulmonary nodules. These are
nonspecific, but could be related to acute disseminated infection,
or could be postinflammatory. None are cavitary, and there are no
areas of consolidation or confluent opacity. No pleural effusion.
Non-contrast chest CT at 3-6 months is recommended.
If the nodules are stable at time of repeat CT, then future CT at
18-24 months (from today's scan) is considered optional for low-risk
patients, but is recommended for high-risk patients. This
recommendation follows the consensus statement: Guidelines for
Management of Incidental Pulmonary Nodules Detected on CT Images:

3. Mild cardiomegaly. Calcified coronary artery atherosclerosis.

## 2017-12-10 IMAGING — CT CT HEAD W/O CM
3 series · 16 of 47 positions shown, 19 images · non-contrast
Comparison: None.

CLINICAL DATA: Head trauma. Syncopal episode yesterday with
dizziness. Tachycardia.

EXAM:
CT HEAD WITHOUT CONTRAST
TECHNIQUE: Contiguous axial images were obtained from the base of the skull
through the vertex without intravenous contrast.

[Series 2: head wo · axial · 0.46mm/px · z∈[+1583,+1708]mm · 10 of 30 slices shown, 13 images]
[im 3/30  brain]
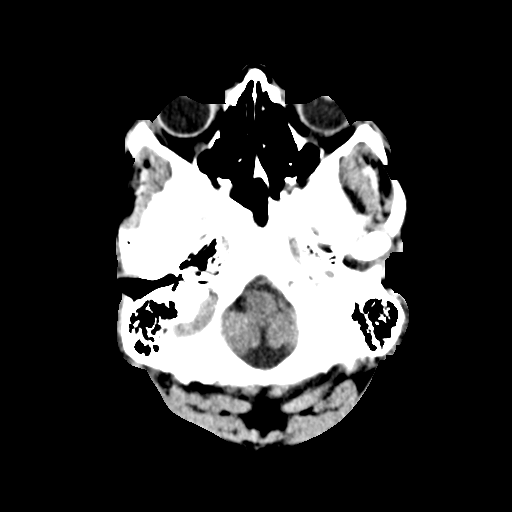
[im 3/30  bone]
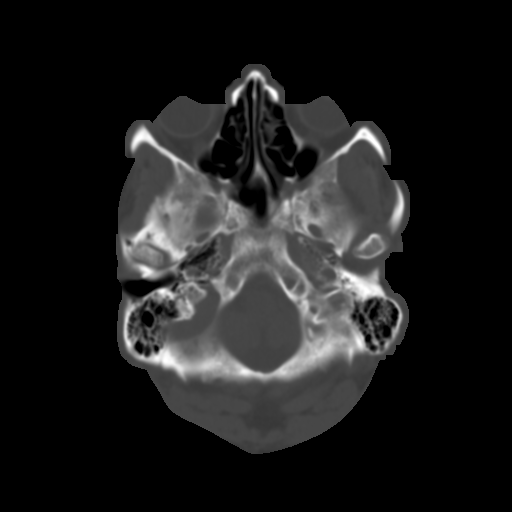
[im 6/30  brain]
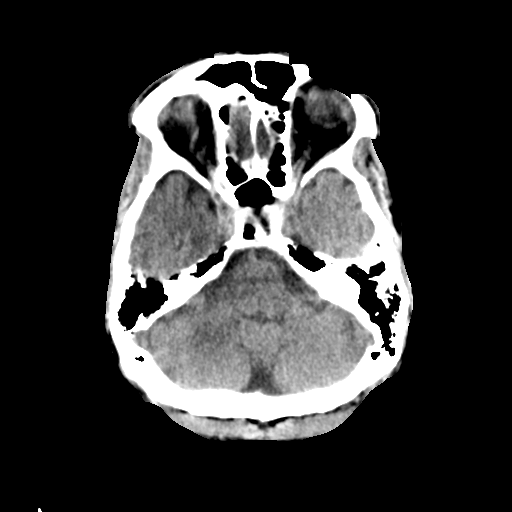
[im 9/30  brain]
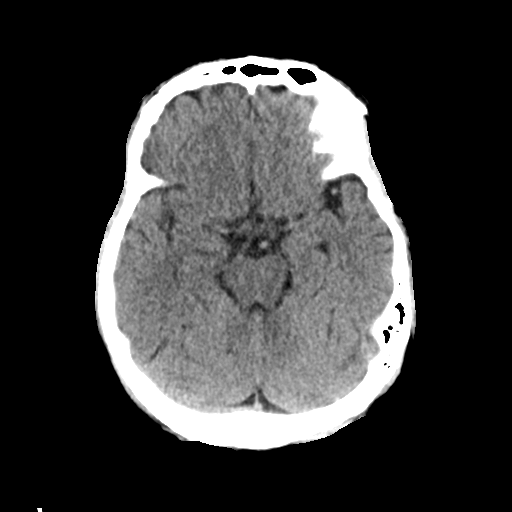
[im 11/30  brain]
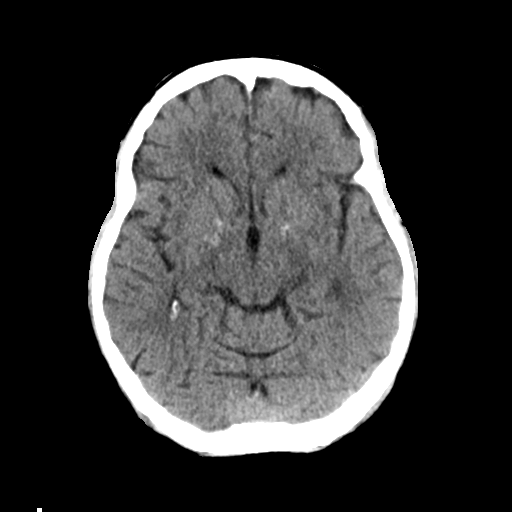
[im 14/30  brain]
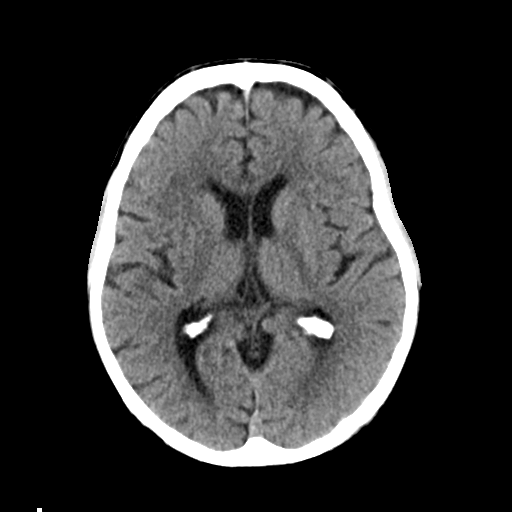
[im 14/30  bone]
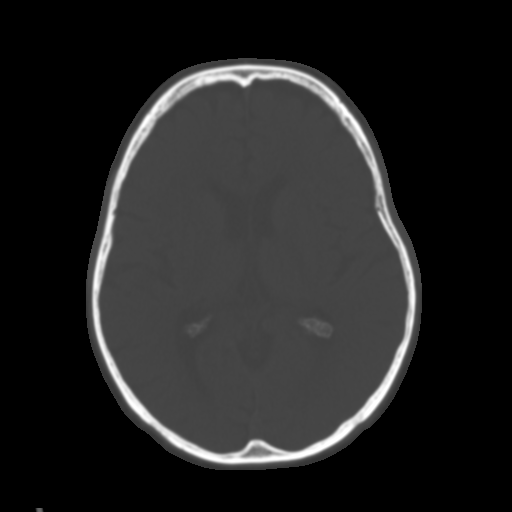
[im 17/30  brain]
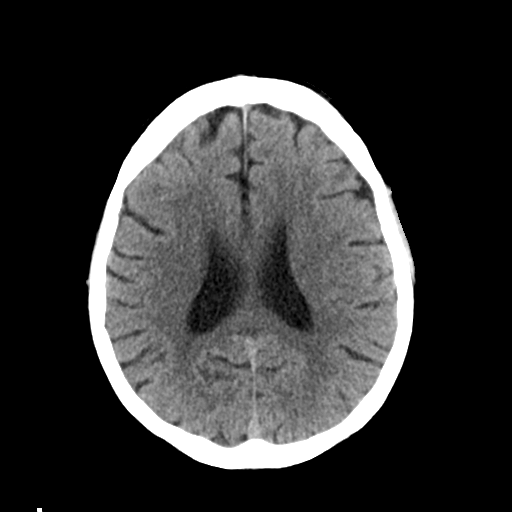
[im 20/30  brain]
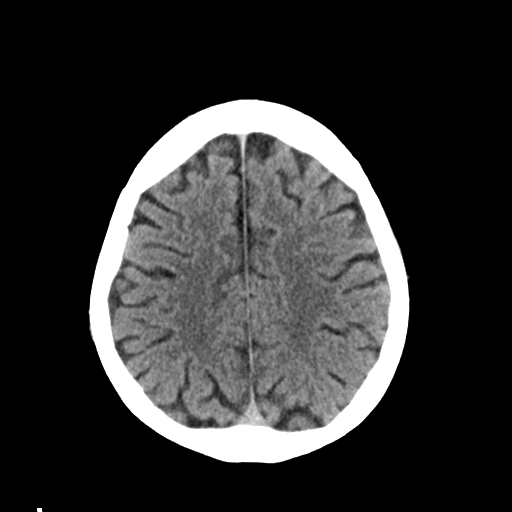
[im 23/30  brain]
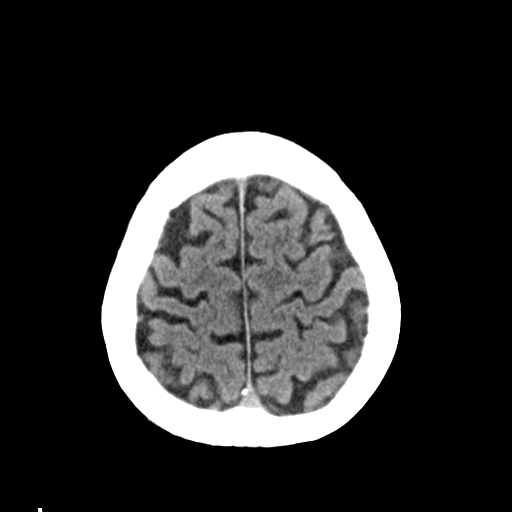
[im 25/30  brain]
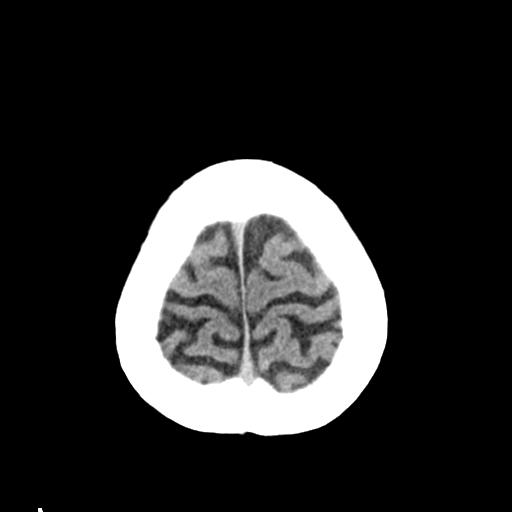
[im 25/30  bone]
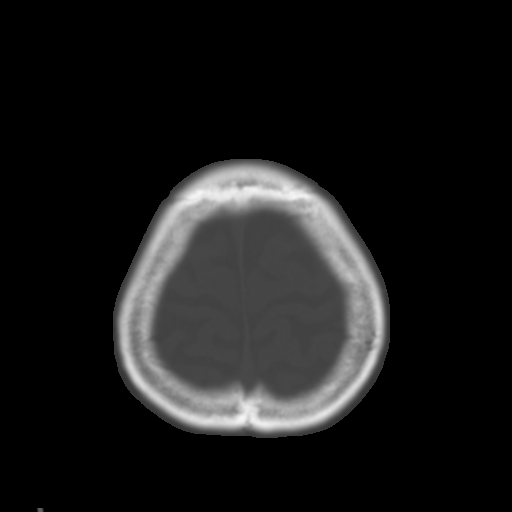
[im 28/30  brain]
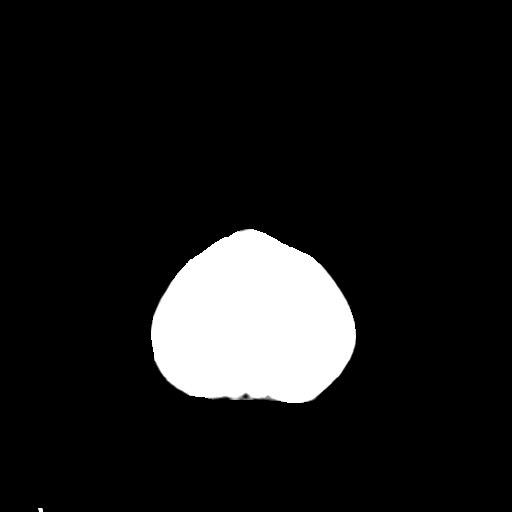

[Series 4: coronal soft tissue · coronal · 0.36mm/px · 3 of 73 slices shown]
[im 25/73  brain]
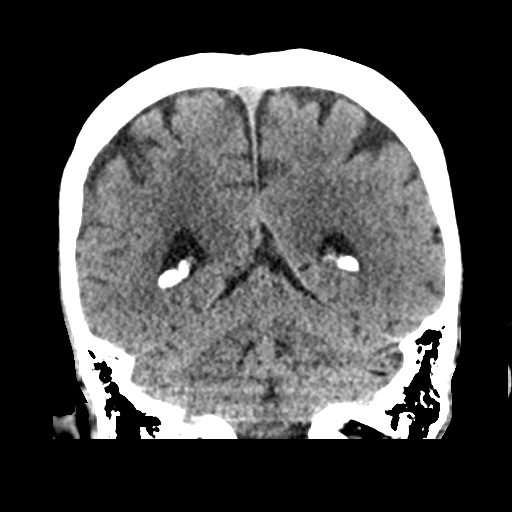
[im 33/73  brain]
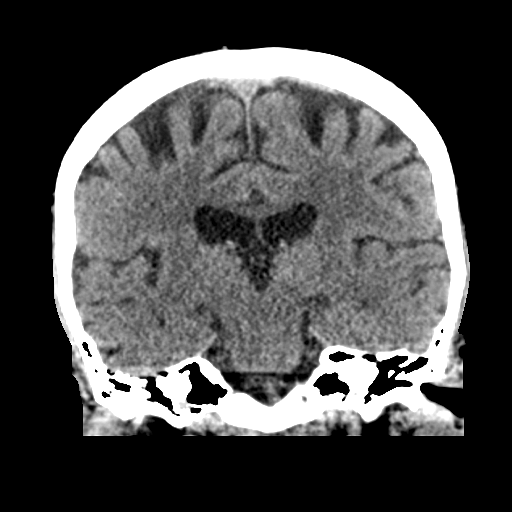
[im 41/73  brain]
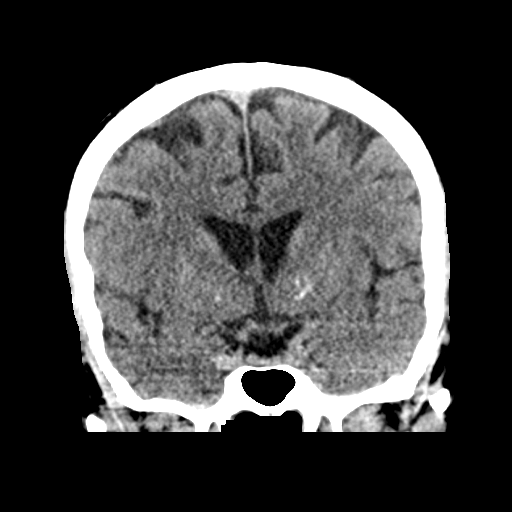

[Series 5: sagittal soft tissue · sagittal · 0.34mm/px · 3 of 65 slices shown]
[im 22/65  brain]
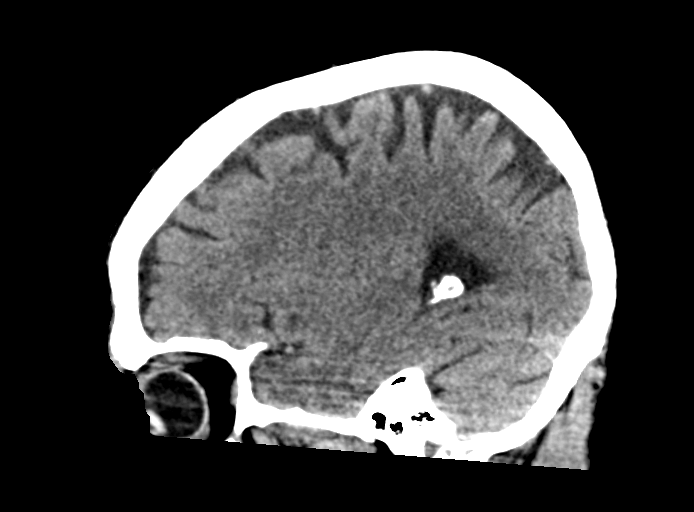
[im 33/65  brain]
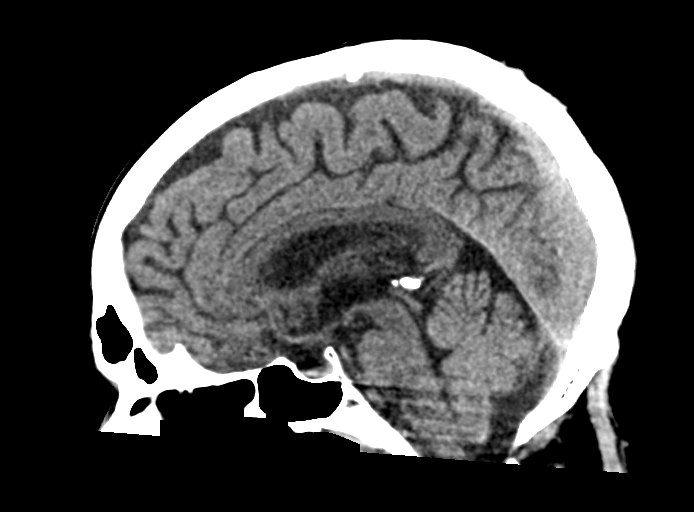
[im 43/65  brain]
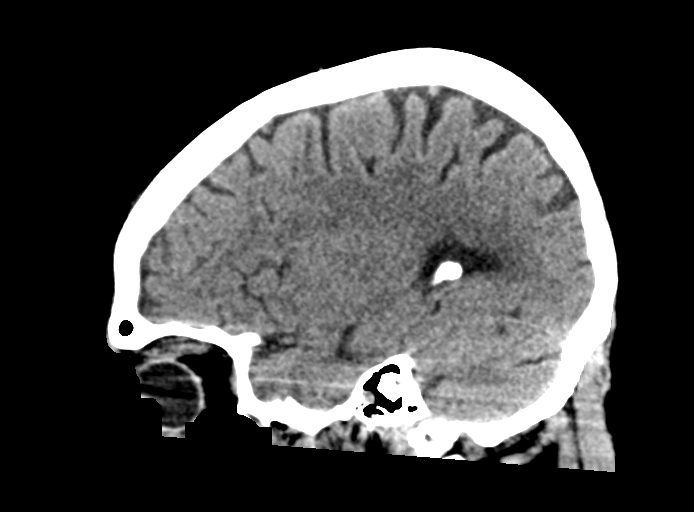

[16 of 47 positions shown; findings below may reference images not displayed]

FINDINGS: Brain: There is no evidence of acute infarct, intracranial
hemorrhage, mass, midline shift, or extra-axial fluid collection.
The ventricles and sulci are normal. Periventricular white matter
hypodensities are nonspecific but compatible with mild chronic small
vessel ischemic disease.

Vascular: No hyperdense vessel.

Skull: No fracture or focal osseous lesion.

Sinuses/Orbits: Visualized paranasal sinuses and mastoid air cells
are clear. Orbits are unremarkable.

Other: None.
IMPRESSION: 1. No evidence of acute intracranial abnormality.
2. Mild chronic small vessel ischemic disease.

## 2017-12-10 MED ORDER — MORPHINE SULFATE (PF) 2 MG/ML IV SOLN
1.0000 mg | INTRAVENOUS | Status: DC | PRN
  Administered 2017-12-15 (×2): 1 mg via INTRAVENOUS
  Filled 2017-12-10 (×2): qty 1

## 2017-12-10 MED ORDER — IPRATROPIUM-ALBUTEROL 0.5-2.5 (3) MG/3ML IN SOLN: 3.0000 mL | Freq: Four times a day (QID) | RESPIRATORY_TRACT | Status: DC | PRN

## 2017-12-10 MED ORDER — THIAMINE HCL 100 MG/ML IJ SOLN: 100.0000 mg | Freq: Every day | INTRAMUSCULAR | Status: DC

## 2017-12-10 MED ORDER — LORAZEPAM 1 MG PO TABS
0.0000 mg | ORAL_TABLET | Freq: Four times a day (QID) | ORAL | Status: DC
  Administered 2017-12-12: 1 mg via ORAL
  Filled 2017-12-10: qty 1

## 2017-12-10 MED ORDER — ACETAMINOPHEN 325 MG PO TABS: 650.0000 mg | ORAL_TABLET | Freq: Four times a day (QID) | ORAL | Status: DC | PRN

## 2017-12-10 MED ORDER — IOPAMIDOL (ISOVUE-370) INJECTION 76%
100.0000 mL | Freq: Once | INTRAVENOUS | Status: AC | PRN
  Administered 2017-12-10: 100 mL via INTRAVENOUS

## 2017-12-10 MED ORDER — TRAZODONE HCL 50 MG PO TABS
25.0000 mg | ORAL_TABLET | Freq: Every evening | ORAL | Status: DC | PRN
Start: 2017-12-10 — End: 2017-12-17
  Administered 2017-12-13 – 2017-12-14 (×2): 25 mg via ORAL
  Filled 2017-12-10 (×2): qty 1

## 2017-12-10 MED ORDER — SODIUM CHLORIDE 0.9% FLUSH
3.0000 mL | Freq: Two times a day (BID) | INTRAVENOUS | Status: DC
  Administered 2017-12-10 – 2017-12-17 (×15): 3 mL via INTRAVENOUS

## 2017-12-10 MED ORDER — DILTIAZEM HCL-DEXTROSE 100-5 MG/100ML-% IV SOLN (PREMIX)
5.0000 mg/h | INTRAVENOUS | Status: DC
  Administered 2017-12-10: 5 mg/h via INTRAVENOUS
  Administered 2017-12-10 (×2): 10 mg/h via INTRAVENOUS
  Administered 2017-12-11: 15 mg/h via INTRAVENOUS
  Administered 2017-12-11: 7.5 mg/h via INTRAVENOUS
  Administered 2017-12-12: 10 mg/h via INTRAVENOUS
  Administered 2017-12-12: 5 mg/h via INTRAVENOUS
  Filled 2017-12-10 (×8): qty 100

## 2017-12-10 MED ORDER — LORAZEPAM 2 MG/ML IJ SOLN
1.0000 mg | Freq: Four times a day (QID) | INTRAMUSCULAR | Status: DC | PRN
  Administered 2017-12-11 – 2017-12-12 (×2): 1 mg via INTRAVENOUS
  Filled 2017-12-10 (×2): qty 1

## 2017-12-10 MED ORDER — LEVOFLOXACIN 750 MG PO TABS
750.0000 mg | ORAL_TABLET | Freq: Every day | ORAL | Status: DC
  Administered 2017-12-10 – 2017-12-14 (×4): 750 mg via ORAL
  Filled 2017-12-10 (×5): qty 1

## 2017-12-10 MED ORDER — CARVEDILOL 3.125 MG PO TABS
3.1250 mg | ORAL_TABLET | Freq: Two times a day (BID) | ORAL | Status: DC
  Filled 2017-12-10 (×4): qty 1

## 2017-12-10 MED ORDER — HEPARIN BOLUS VIA INFUSION
4000.0000 [IU] | Freq: Once | INTRAVENOUS | Status: AC
  Administered 2017-12-10: 4000 [IU] via INTRAVENOUS

## 2017-12-10 MED ORDER — HEPARIN BOLUS VIA INFUSION
2000.0000 [IU] | Freq: Once | INTRAVENOUS | Status: AC
  Administered 2017-12-11: 2000 [IU] via INTRAVENOUS
  Filled 2017-12-10: qty 2000

## 2017-12-10 MED ORDER — VANCOMYCIN HCL IN DEXTROSE 750-5 MG/150ML-% IV SOLN
750.0000 mg | Freq: Two times a day (BID) | INTRAVENOUS | Status: DC
Start: 2017-12-10 — End: 2017-12-10
  Filled 2017-12-10 (×2): qty 150

## 2017-12-10 MED ORDER — METOPROLOL TARTRATE 5 MG/5ML IV SOLN
2.5000 mg | Freq: Once | INTRAVENOUS | Status: AC
  Administered 2017-12-10: 2.5 mg via INTRAVENOUS
  Filled 2017-12-10: qty 5

## 2017-12-10 MED ORDER — PANTOPRAZOLE SODIUM 40 MG PO TBEC
40.0000 mg | DELAYED_RELEASE_TABLET | Freq: Every day | ORAL | Status: DC
  Administered 2017-12-10 – 2017-12-17 (×6): 40 mg via ORAL
  Filled 2017-12-10 (×6): qty 1

## 2017-12-10 MED ORDER — ONDANSETRON HCL 4 MG/2ML IJ SOLN: 4.0000 mg | Freq: Four times a day (QID) | INTRAMUSCULAR | Status: DC | PRN

## 2017-12-10 MED ORDER — PIPERACILLIN-TAZOBACTAM 3.375 G IVPB: 3.3750 g | Freq: Three times a day (TID) | INTRAVENOUS | Status: DC

## 2017-12-10 MED ORDER — SODIUM CHLORIDE 0.9 % IV SOLN
INTRAVENOUS | Status: DC
  Administered 2017-12-10: 07:00:00 via INTRAVENOUS

## 2017-12-10 MED ORDER — PIPERACILLIN-TAZOBACTAM 3.375 G IVPB 30 MIN
3.3750 g | Freq: Once | INTRAVENOUS | Status: AC
  Administered 2017-12-10: 3.375 g via INTRAVENOUS
  Filled 2017-12-10: qty 50

## 2017-12-10 MED ORDER — DILTIAZEM LOAD VIA INFUSION
5.0000 mg | Freq: Once | INTRAVENOUS | Status: AC
  Administered 2017-12-10: 5 mg via INTRAVENOUS
  Filled 2017-12-10: qty 5

## 2017-12-10 MED ORDER — VANCOMYCIN HCL IN DEXTROSE 1-5 GM/200ML-% IV SOLN
1000.0000 mg | Freq: Once | INTRAVENOUS | Status: AC
  Administered 2017-12-10: 1000 mg via INTRAVENOUS
  Filled 2017-12-10: qty 200

## 2017-12-10 MED ORDER — SODIUM CHLORIDE 0.9 % IV BOLUS (SEPSIS)
1000.0000 mL | Freq: Once | INTRAVENOUS | Status: AC
  Administered 2017-12-10: 1000 mL via INTRAVENOUS

## 2017-12-10 MED ORDER — SENNA 8.6 MG PO TABS
1.0000 | ORAL_TABLET | Freq: Two times a day (BID) | ORAL | Status: DC
  Administered 2017-12-10 – 2017-12-17 (×10): 8.6 mg via ORAL
  Filled 2017-12-10 (×13): qty 1

## 2017-12-10 MED ORDER — METOPROLOL TARTRATE 5 MG/5ML IV SOLN
5.0000 mg | Freq: Three times a day (TID) | INTRAVENOUS | Status: DC
  Administered 2017-12-10 – 2017-12-11 (×3): 5 mg via INTRAVENOUS
  Filled 2017-12-10 (×3): qty 5

## 2017-12-10 MED ORDER — ONDANSETRON HCL 4 MG PO TABS: 4.0000 mg | ORAL_TABLET | Freq: Four times a day (QID) | ORAL | Status: DC | PRN

## 2017-12-10 MED ORDER — MAGNESIUM SULFATE 4 GM/100ML IV SOLN
4.0000 g | Freq: Once | INTRAVENOUS | Status: AC
  Administered 2017-12-10: 4 g via INTRAVENOUS
  Filled 2017-12-10: qty 100

## 2017-12-10 MED ORDER — SODIUM CHLORIDE 0.9 % IV SOLN
INTRAVENOUS | Status: DC
  Administered 2017-12-10 – 2017-12-14 (×6): via INTRAVENOUS

## 2017-12-10 MED ORDER — FOLIC ACID 1 MG PO TABS
1.0000 mg | ORAL_TABLET | Freq: Every day | ORAL | Status: DC
  Administered 2017-12-10 – 2017-12-17 (×7): 1 mg via ORAL
  Filled 2017-12-10 (×7): qty 1

## 2017-12-10 MED ORDER — VITAMIN B-1 100 MG PO TABS
100.0000 mg | ORAL_TABLET | Freq: Every day | ORAL | Status: DC
  Administered 2017-12-10 – 2017-12-17 (×7): 100 mg via ORAL
  Filled 2017-12-10 (×8): qty 1

## 2017-12-10 MED ORDER — LORAZEPAM 1 MG PO TABS: 0.0000 mg | ORAL_TABLET | Freq: Two times a day (BID) | ORAL | Status: DC

## 2017-12-10 MED ORDER — HEPARIN (PORCINE) IN NACL 100-0.45 UNIT/ML-% IJ SOLN
1350.0000 [IU]/h | INTRAMUSCULAR | Status: DC
  Administered 2017-12-10 (×2): 1000 [IU]/h via INTRAVENOUS
  Administered 2017-12-11: 1350 [IU]/h via INTRAVENOUS
  Filled 2017-12-10 (×3): qty 250

## 2017-12-10 MED ORDER — ADENOSINE 6 MG/2ML IV SOLN
6.0000 mg | Freq: Once | INTRAVENOUS | Status: AC
  Administered 2017-12-10: 6 mg via INTRAVENOUS
  Filled 2017-12-10: qty 2

## 2017-12-10 MED ORDER — NICOTINE 21 MG/24HR TD PT24: 21.0000 mg | MEDICATED_PATCH | TRANSDERMAL | Status: DC | PRN

## 2017-12-10 MED ORDER — NICOTINE 21 MG/24HR TD PT24
21.0000 mg | MEDICATED_PATCH | Freq: Every day | TRANSDERMAL | Status: DC
  Administered 2017-12-10 – 2017-12-17 (×7): 21 mg via TRANSDERMAL
  Filled 2017-12-10 (×7): qty 1

## 2017-12-10 MED ORDER — PANTOPRAZOLE SODIUM 20 MG PO TBEC
20.0000 mg | DELAYED_RELEASE_TABLET | Freq: Every day | ORAL | Status: DC
  Filled 2017-12-10 (×3): qty 1

## 2017-12-10 MED ORDER — ACETAMINOPHEN 650 MG RE SUPP: 650.0000 mg | Freq: Four times a day (QID) | RECTAL | Status: DC | PRN

## 2017-12-10 MED ORDER — LORAZEPAM 1 MG PO TABS: 1.0000 mg | ORAL_TABLET | Freq: Four times a day (QID) | ORAL | Status: DC | PRN

## 2017-12-10 MED ORDER — ADULT MULTIVITAMIN W/MINERALS CH
1.0000 | ORAL_TABLET | Freq: Every day | ORAL | Status: DC
  Administered 2017-12-10 – 2017-12-17 (×7): 1 via ORAL
  Filled 2017-12-10 (×7): qty 1

## 2017-12-10 NOTE — Consult Note (Addendum)
Cardiology Consultation:   Patient ID: Nicholas Avila; [id number]; July 21, 1960   Admit date: 12/10/2017 Date of Consult: 12/10/2017  Primary Care Provider: Patient, No Pcp Per Primary Cardiologist: Carlyle Dolly, MD  New Primary Electrophysiologist:  NA   Patient Profile:   Nicholas Avila is a 57 y.o. male with a hx of tobacco abuse who is being seen today for the evaluation of syncope, sinus tachycardia at the request of Dr. Wyvonnia Dusky.  History of Present Illness:   Nicholas Avila is a 57 year old male patient who was admitted with 1 week history of fever chills and productive cough.  Yesterday he was lightheaded and had a syncopal episode and hit his head losing consciousness.  CT of the head was negative except for small vessel disease, chest CT numerous superimposed bilateral pulmonary solid and sub-solid pulmonary nodules.  Nonspecific but could be related to acute disseminated infection or could be postinflammatory.  EKG sinus tachycardia at 141 bpm, lactic acid 5.22 but repeat was normal.  Patient interviewed with his mother at his bedside.  He is married with 4 children.  He works Field seismologist.  He has been a heavy drinker for the past 20 years drinking a half of pint a whole pint daily as well as 240 ounce beers daily.  Over 4 July he drank day and night and cannot even quantify how much he drank.  When he came home he felt bad in his legs started swelling so he quit drinking whiskey.  He continues to drink 2-40 ounce beers daily.  1 week ago he developed productive cough with yellow sputum, fever and chills and dizziness.  After syncopal episode yesterday he became dizzy again and came to the emergency room.  Denies any history of hypertension, HLD, DM 2 aunts have coronary artery disease.  He started smoking marijuana about 1 month ago but last time he smoked was a week ago.  Denies any other illicit drug use.  History reviewed. No pertinent past medical history.  History reviewed.  No pertinent surgical history.   Home Medications:  Prior to Admission medications   Medication Sig Start Date End Date Taking? Authorizing Provider  azithromycin (ZITHROMAX Z-PAK) 250 MG tablet Two tablets first day and then one a day 05/31/13   Fransico Meadow, PA-C  benzonatate (TESSALON) 100 MG capsule Take 1 capsule (100 mg total) by mouth every 8 (eight) hours. 05/31/13   Fransico Meadow, PA-C  DM-Phenylephrine-Acetaminophen (ALKA-SELTZER PLS SINUS & COUGH PO) Take 1 tablet by mouth daily as needed (cold symptoms).    [provider]    Inpatient Medications: Scheduled Meds: . nicotine  21 mg Transdermal Daily   Continuous Infusions: . sodium chloride Stopped (12/10/17 0748)  . diltiazem (CARDIZEM) infusion 15 mg/hr (12/10/17 0721)  . heparin 1,000 Units/hr (12/10/17 0747)  . piperacillin-tazobactam (ZOSYN)  IV    . vancomycin     PRN Meds:   Allergies:   No Known Allergies  Social History:   Social History   Socioeconomic History  . Marital status: Single    Spouse name: Not on file  . Number of children: Not on file  . Years of education: Not on file  . Highest education level: Not on file  Occupational History  . Not on file  Social Needs  . Financial resource strain: Not on file  . Food insecurity:    Worry: Not on file    Inability: Not on file  . Transportation needs:  Medical: Not on file    Non-medical: Not on file  Tobacco Use  . Smoking status: Current Every Day Smoker    Packs/day: 0.50    Types: Cigarettes  . Smokeless tobacco: Never Used  Substance and Sexual Activity  . Alcohol use: Yes    Comment: beer every day  . Drug use: Yes    Types: Marijuana  . Sexual activity: Not on file  Lifestyle  . Physical activity:    Days per week: Not on file    Minutes per session: Not on file  . Stress: Not on file  Relationships  . Social connections:    Talks on phone: Not on file    Gets together: Not on file    Attends religious  service: Not on file    Active member of club or organization: Not on file    Attends meetings of clubs or organizations: Not on file    Relationship status: Not on file  . Intimate partner violence:    Fear of current or ex partner: Not on file    Emotionally abused: Not on file    Physically abused: Not on file    Forced sexual activity: Not on file  Other Topics Concern  . Not on file  Social History Narrative  . Not on file    Family History:    Family History  Problem Relation Age of Onset  . Colon cancer Father   . Heart disease Maternal Aunt      ROS:  Please see the history of present illness.  Review of Systems  Constitution: Negative.  HENT: Negative.   Cardiovascular: Positive for leg swelling and syncope.  Respiratory: Positive for cough, shortness of breath and sputum production.   Endocrine: Negative.   Hematologic/Lymphatic: Negative.   Musculoskeletal: Negative.   Gastrointestinal: Negative.   Genitourinary: Negative.   Neurological: Positive for dizziness and weakness.    All other ROS reviewed and negative.     Physical Exam/Data:   Vitals:   12/10/17 0645 12/10/17 0700 12/10/17 0730 12/10/17 0745  BP:  117/90 (!) 134/95 (!) 131/104  Pulse: (!) 139  (!) 139 (!) 139  Resp: (!) 26 19 16  (!) 22  Temp:      TempSrc:      SpO2: 100%  100% 100%  Weight:      Height:        Intake/Output Summary (Last 24 hours) at 12/10/2017 0917 Last data filed at 12/10/2017 0748 Gross per 24 hour  Intake 3250 ml  Output -  Net 3250 ml   Filed Weights   12/10/17 0437 12/10/17 0440  Weight: 145 lb (65.8 kg) 135 lb (61.2 kg)   Body mass index is 18.83 kg/m.  General:  Well nourished, well developed, in no acute distress HEENT: normal Lymph: no adenopathy Neck: no JVD Endocrine:  No thryomegaly Vascular: No carotid bruits; FA pulses 2+ bilaterally without bruits  Cardiac:  normal S1, S2; RRR at 130 bpm Lungs: Decreased breath sounds with scattered  rhonchi  abd: soft, nontender, no hepatomegaly  Ext: Trace ankle edema Musculoskeletal:  No deformities, BUE and BLE strength normal and equal Skin: warm and dry  Neuro:  CNs 2-12 intact, no focal abnormalities noted Psych:  Normal affect   EKG:  The EKG was personally reviewed and demonstrates: Sinus tachycardia at 140 bpm Telemetry:  Telemetry was personally reviewed and demonstrates: Sinus tachycardia at 130 bpm  Relevant CV Studies:    Laboratory Data:  Chemistry Recent Labs  Lab 12/10/17 0451  NA 135  K 3.6  CL 97*  CO2 23  GLUCOSE 196*  BUN 9  CREATININE 1.12  CALCIUM 8.8*  GFRNONAA >60  GFRAA >60  ANIONGAP 15    Recent Labs  Lab 12/10/17 0451  PROT 7.1  ALBUMIN 3.7  AST 46*  ALT 33  ALKPHOS 146*  BILITOT 0.7   Hematology Recent Labs  Lab 12/10/17 0451  WBC 8.7  RBC 4.13*  HGB 13.1  HCT 39.2  MCV 94.9  MCH 31.7  MCHC 33.4  RDW 15.1  PLT 272   Cardiac Enzymes Recent Labs  Lab 12/10/17 0451  TROPONINI <0.03   No results for input(s): TROPIPOC in the last 168 hours.  BNPNo results for input(s): BNP, PROBNP in the last 168 hours.  DDimer No results for input(s): DDIMER in the last 168 hours.  Radiology/Studies:  Ct Head Wo Contrast  Result Date: 12/10/2017 CLINICAL DATA:  Head trauma. Syncopal episode yesterday with dizziness. Tachycardia. EXAM: CT HEAD WITHOUT CONTRAST TECHNIQUE: Contiguous axial images were obtained from the base of the skull through the vertex without intravenous contrast. COMPARISON:  None. FINDINGS: Brain: There is no evidence of acute infarct, intracranial hemorrhage, mass, midline shift, or extra-axial fluid collection. The ventricles and sulci are normal. Periventricular white matter hypodensities are nonspecific but compatible with mild chronic small vessel ischemic disease. Vascular: No hyperdense vessel. Skull: No fracture or focal osseous lesion. Sinuses/Orbits: Visualized paranasal sinuses and mastoid air cells are  clear. Orbits are unremarkable. Other: None. IMPRESSION: 1. No evidence of acute intracranial abnormality. 2. Mild chronic small vessel ischemic disease. Electronically Signed   By: Logan Bores M.D.   On: 12/10/2017 07:26   Ct Angio Chest Pe W And/or Wo Contrast  Result Date: 12/10/2017 CLINICAL DATA:  57 year old male with fever, chills, productive cough. Tachycardia. Syncopal episode yesterday. EXAM: CT ANGIOGRAPHY CHEST WITH CONTRAST TECHNIQUE: Multidetector CT imaging of the chest was performed using the standard protocol during bolus administration of intravenous contrast. Multiplanar CT image reconstructions and MIPs were obtained to evaluate the vascular anatomy. CONTRAST:  155mL ISOVUE-370 IOPAMIDOL (ISOVUE-370) INJECTION 76% COMPARISON:  Chest radiographs 0514 hours today and earlier. FINDINGS: Cardiovascular: Good contrast bolus timing in the pulmonary arterial tree. No focal filling defect identified in the pulmonary arteries to suggest acute pulmonary embolism. Calcified coronary artery atherosclerosis (series 5, image 201). Mild cardiomegaly. No pericardial effusion. Negative visible aorta aside from mild calcified plaque. Mediastinum/Nodes: Negative thoracic inlet. Negative mediastinum. No lymphadenopathy. Lungs/Pleura: The major airways are patent.  Centrilobular emphysema Mild dependent atelectasis in both lungs. Fairly numerous (roughly 21-25) scattered solid and sub solid pulmonary nodules in both lungs. Many are sub solid and irregular and either peribronchial or peripheral/subpleural. The largest nodules are 7-8 millimeters (right upper lobe series 6, image 79 and left lower lobe image 142). No cavitary nodules. No consolidation. No pleural effusion or other abnormal pulmonary opacity. Upper Abdomen: Negative visible liver, spleen, pancreas, adrenal glands, kidneys, and bowel in the left upper quadrant. Musculoskeletal: No acute osseous abnormality identified. Review of the MIP images  confirms the above findings. IMPRESSION: 1. No evidence of acute pulmonary embolus. 2. Emphysema (ICD10-J43.9) with fairly numerous superimposed bilateral pulmonary solid and sub solid pulmonary nodules. These are nonspecific, but could be related to acute disseminated infection, or could be postinflammatory. None are cavitary, and there are no areas of consolidation or confluent opacity. No pleural effusion. Non-contrast chest CT at 3-6 months is recommended. If  the nodules are stable at time of repeat CT, then future CT at 18-24 months (from today's scan) is considered optional for low-risk patients, but is recommended for high-risk patients. This recommendation follows the consensus statement: Guidelines for Management of Incidental Pulmonary Nodules Detected on CT Images: From the Fleischner Society 2017; Radiology 2017; 284:228-243. 3. Mild cardiomegaly. Calcified coronary artery atherosclerosis. Electronically Signed   By: Genevie Ann M.D.   On: 12/10/2017 07:30   Dg Chest Port 1 View  Result Date: 12/10/2017 CLINICAL DATA:  Cough, chills and fever. EXAM: PORTABLE CHEST 1 VIEW COMPARISON:  Chest radiograph May 31, 2013 FINDINGS: Cardiac silhouette is upper limits of normal size. Calcified aortic arch. No pleural effusion or focal consolidation. No pneumothorax. Slightly asymmetrically dense LEFT mid lung zone favoring soft tissue attenuation without focal consolidation or pleural effusion. No pneumothorax. Soft tissue planes and included osseous structures are non suspicious. IMPRESSION: Asymmetric density LEFT mid lung zone favored as soft tissue attenuation though, recommend follow-up PA and lateral views of the chest when clinically able. Borderline cardiomegaly. Aortic Atherosclerosis (ICD10-I70.0). Electronically Signed   By: Elon Alas M.D.   On: 12/10/2017 05:53    Assessment and Plan:   1. Sepsis most likely secondary to pneumonia 2. Heavy EtOH use for 20 years quit whiskey 3 weeks ago  but still drinking 2-40 ounce beers daily-will need to quit all alcohol. 3.     Recent ankle swelling and shortness of breath. Risks for both EtOH and tachycardia mediate CM. F/u echo.  4.Sinus tachycardia/Atrial flutter with RVR most likely secondary to sepsis not responding to IV diltiazem and no response to adenosine.  Add IV lopressor.   5.Syncope in setting of sepsis, poor oral intake/dehydration/arrythmias 6.Tobacco abuse smokes half pack per day smoking cessation recommended   For questions or updates, please contact [place] Please consult [email or web address] for contact info under Cardiology/STEMI.   Signed, Ermalinda Barrios, PA-C  12/10/2017 9:17 AM   Patient seen and discussed with PA Bonnell Public, I agree with her documentation above. Presents with subjective cough with yellow sputum and poor oral intake along with syncope. Managed as a code sepsis in ER with early abx and IVFs.   K 3.6 Cr 1.12 WBC 8.7 Hgb 13.1 Lactic acid 5.22-->1.39  TSH pending CT PE no PE. +emphysema. Pulm nodules. CAD Trop neg  CXR asymetric left density mid lobe CT head no acute process EKG initial regular narrow complex tach 150 probable aflutter. Follow up EKG shows aflutter   Patient presents with possible sepsis, in this setting he is in aflutter with RVR. Has received adenosine 6mg , IV dilt 5mg , lopressor IV 2.32followed by dilt gtt and started on hep gtt. His CHADS2Vasc score is 0 based on known history, however he has not seen doctors in several years. Continue heparin for now as we gather more clinical information. Continue dilt gtt, will give IV lopressor as well as bp's remains stable, if soft bp's or unable to control rates would plan for short course of amiodarone.  Aflutter in setting of systemic illness, unclear if isolated event. He has no specific symptoms related to his arrhythmia. Syncope in setting of systemic illness/sepsis, poor oral intake, and arrhythmia. F/u echo once rates better controlled.EtOH  abuse, monitor for signs of withdrawal.    Carlyle Dolly MD

## 2017-12-10 NOTE — Progress Notes (Signed)
ANTICOAGULATION CONSULT NOTE - Preliminary  Pharmacy Consult for Heparin Indication: Atrial fibrillation  No Known Allergies  Patient Measurements: Height: 5\' 11"  (180.3 cm) Weight: 135 lb (61.2 kg) IBW/kg (Calculated) : 75.3 HEPARIN DW (KG): 65.8   Vital Signs: Temp: 99.2 F (37.3 C) (07/29 0442) Temp Source: Rectal (07/29 0442) BP: 117/90 (07/29 0700) Pulse Rate: 139 (07/29 0645)  Labs: Recent Labs    12/10/17 0451  HGB 13.1  HCT 39.2  PLT 272  CREATININE 1.12  TROPONINI <0.03   Estimated Creatinine Clearance: 63.8 mL/min (by C-G formula based on SCr of 1.12 mg/dL).  Medical History: History reviewed. No pertinent past medical history.  Medications:   Assessment: 57 yo male presented to the ED with chills, fever and cough.  Code sepsis activated. Pt also tachycardic. Cardiology consult ordered. Pharmacy has been asked to provide IV heparin dosing.  Goal of Therapy:  Heparin level goal: 0.3-0.7 units/ml Monitor platelets per anticoagulation protocol: Yes   Plan:  Heparin 4000 unit bolus Heparin infusion at 1000 units/hr Heparin level in 6-8 hrs  Preliminary review of pertinent patient information completed.  Forestine Na clinical pharmacist will complete review during morning rounds to assess the patient and finalize treatment regimen.  Norberto Sorenson, [facility] 12/10/2017,7:34 AM

## 2017-12-10 NOTE — Progress Notes (Signed)
Pharmacy Antibiotic Note  Nicholas Avila is a 57 y.o. male admitted on 12/10/2017 with sepsis.  Pharmacy has been consulted for Vancomycin and zosyn dosing.  Plan: Vancomycin 1000mg  given in ED, then 750mg  IV every 12 hours.  Goal trough 15-20 mcg/mL. Zosyn 3.375g IV q8h (4 hour infusion).  F/U cxs and clinical progress Monitor V/S, labs, and levels as indicated  Height: 5\' 11"  (180.3 cm) Weight: 135 lb (61.2 kg) IBW/kg (Calculated) : 75.3  Temp (24hrs), Avg:98.6 F (37 C), Min:98 F (36.7 C), Max:99.2 F (37.3 C)  Recent Labs  Lab 12/10/17 0451 12/10/17 0457 12/10/17 0737  WBC 8.7  --   --   CREATININE 1.12  --   --   LATICACIDVEN  --  5.22* 1.39    Estimated Creatinine Clearance: 63.8 mL/min (by C-G formula based on SCr of 1.12 mg/dL).    No Known Allergies  Antimicrobials this admission: Vancomycin 7/29 >>  Zosyn 7/29 >>   Dose adjustments this admission: N/a  Microbiology results: 7/29 BCx: pending  UCx:  MRSA PCR:   Thank you for allowing pharmacy to be a part of this patient's care.  Isac Sarna, BS Pharm D, [place] Clinical Pharmacist Pager [phone] 12/10/2017 8:53 AM

## 2017-12-10 NOTE — Progress Notes (Signed)
ANTICOAGULATION CONSULT NOTE Pharmacy Consult for Heparin Indication: Atrial fibrillation  No Known Allergies  Patient Measurements: Height: 5\' 11"  (180.3 cm) Weight: 134 lb 14.7 oz (61.2 kg) IBW/kg (Calculated) : 75.3 HEPARIN DW (KG): 61.2   Vital Signs: Temp: 98.3 F (36.8 C) (07/29 2000) Temp Source: Oral (07/29 2000) BP: 108/77 (07/29 1421) Pulse Rate: 115 (07/29 1421)  Labs: Recent Labs    12/10/17 0451 12/10/17 1358  HGB 13.1  --   HCT 39.2  --   PLT 272  --   APTT 32  --   LABPROT 12.7  --   INR 0.96  --   HEPARINUNFRC  --  0.11*  CREATININE 1.12  --   TROPONINI <0.03  --    Estimated Creatinine Clearance: 63.8 mL/min (by C-G formula based on SCr of 1.12 mg/dL).  Medical History: History reviewed. No pertinent past medical history.  Medications:   Assessment: 57 yo male presented to the ED with chills, fever and cough.  Code sepsis activated. Pt also tachycardic. Cardiology consult ordered. Pharmacy has been asked to provide IV heparin dosing.  Heparin level below goal.  Goal of Therapy:  Heparin level goal: 0.3-0.7 units/ml Monitor platelets per anticoagulation protocol: Yes   Plan:  Heparin 2000 unit bolus Increase Heparin infusion at 1150 units/hr Heparin level in 6-8 hrs with AM labs. Monitor for signs and symptoms of bleeding.   Pricilla Larsson, [facility] 12/10/2017,10:21 PM

## 2017-12-10 NOTE — ED Notes (Signed)
Date and time results received: 12/10/17 @05 :04  Test: lactic acid Critical Value: 5.22  Name of Provider Notified: Dr Wyvonnia Dusky  Orders Received? Or Actions Taken?: no additional orders given,

## 2017-12-10 NOTE — ED Notes (Signed)
Pt being transported to CT

## 2017-12-10 NOTE — ED Triage Notes (Signed)
Pt c/o chills, fever, cough that is productive with yellow sputum that started this week, syncopal episode in the yard yesterday with dizziness

## 2017-12-10 NOTE — ED Notes (Signed)
EKG given to Dr. Rancour 

## 2017-12-10 NOTE — ED Notes (Signed)
ED Provider at bedside. 

## 2017-12-10 NOTE — H&P (Addendum)
History and Physical  MOSS BERRY LNL:[id number] DOB: [birth year] DOA: 12/10/2017  Referring physician: Ezequiel Essex, MD PCP: Patient, No Pcp Per   Chief Complaint: Syncopal episode  HPI: Nicholas Avila is a 57 y.o. male with no significant history. He complains of chills, fever, and productive cough for the past week. He admits that he has not had much of an appetite for the past week and has not been eating or drinking much. He states he had a near syncopal episode yesterday. He denies pain and would not rate it on a scale of 1 to 10. He states he has started a new job and has been having lowe extremity edema due to standing all day. He states some shortness of breath with exertion but states he goes away when he is resting.  ED course: vital signs Temp 99.2 rectal, HR 140, BP 139/94, SpO2 97 Room Air. His initial lactic acid was 5.22 and a code sepsis was initiated. Blood cultures were drawn and 2 liters of NS was given as a bolus. Repeat lactic acid after bolus completion was 1.39. EKG showed Atrial Fib on presentation, but repeat EKG was NSR with Tachycardia. There was some transient prolonged QT interval, but was not present on subsequent EKGs. CT of head was unremarkable. CT of chest showed emphysema with suspected postinflammatory pulmonary nodules that could be related to infection. Adenosine was administered along with diltiazem and heparin to manage heart rate by EDP. There has been no change in heart rate. EDP called for cardiology consult and requested admission.   Pt's family member at bedside says that he has lost a lot of weight over last few months.    Review of Systems: All systems reviewed and apart from history of presenting illness, are negative.  History reviewed. No pertinent past medical history. History reviewed. No pertinent surgical history.  Social History:  reports that he has been smoking cigarettes.  He has been smoking about 0.50 packs per day. He has never  used smokeless tobacco. He reports that he drinks alcohol. He reports that he has current or past drug history. Drug: Marijuana.  No Known Allergies  No family history on file.  Prior to Admission medications   Medication Sig Start Date End Date Taking? Authorizing Provider  azithromycin (ZITHROMAX Z-PAK) 250 MG tablet Two tablets first day and then one a day 05/31/13   Fransico Meadow, PA-C  benzonatate (TESSALON) 100 MG capsule Take 1 capsule (100 mg total) by mouth every 8 (eight) hours. 05/31/13   Fransico Meadow, PA-C  DM-Phenylephrine-Acetaminophen (ALKA-SELTZER PLS SINUS & COUGH PO) Take 1 tablet by mouth daily as needed (cold symptoms).    [provider]   Physical Exam: Vitals:   12/10/17 0645 12/10/17 0700 12/10/17 0730 12/10/17 0745  BP:  117/90 (!) 134/95 (!) 131/104  Pulse: (!) 139  (!) 139 (!) 139  Resp: (!) 26 19 16  (!) 22  Temp:      TempSrc:      SpO2: 100%  100% 100%  Weight:      Height:         General exam: thin emaciated malnourished appearing male, Moderately built and nourished patient, lying comfortably supine on the gurney in mild distress.  Head, eyes and ENT: Nontraumatic and normocephalic. Pupils equally reacting to light and accommodation. Oral mucosa moist.  Neck: Supple. No JVD, carotid bruit or thyromegaly.  Lymphatics: No lymphadenopathy.  Respiratory system: Clear to auscultation. No increased work  of breathing.  Cardiovascular system: HR 140, S1 and S2 heard. No JVD, murmurs, gallops, clicks or pedal edema.  Gastrointestinal system: Abdomen is nondistended, soft and nontender. Normal bowel sounds heard. No organomegaly or masses appreciated.  Central nervous system: Alert and oriented. No focal neurological deficits.  Extremities: Symmetric 5 x 5 power. Peripheral pulses symmetrically felt.   Skin: No rashes or acute findings.  Musculoskeletal system: Arthralgias.  Psychiatry: Pleasant and cooperative.  Labs on Admission:    Basic Metabolic Panel: Recent Labs  Lab 12/10/17 0451  NA 135  K 3.6  CL 97*  CO2 23  GLUCOSE 196*  BUN 9  CREATININE 1.12  CALCIUM 8.8*   Liver Function Tests: Recent Labs  Lab 12/10/17 0451  AST 46*  ALT 33  ALKPHOS 146*  BILITOT 0.7  PROT 7.1  ALBUMIN 3.7   No results for input(s): LIPASE, AMYLASE in the last 168 hours. No results for input(s): AMMONIA in the last 168 hours. CBC: Recent Labs  Lab 12/10/17 0451  WBC 8.7  NEUTROABS 6.5  HGB 13.1  HCT 39.2  MCV 94.9  PLT 272   Cardiac Enzymes: Recent Labs  Lab 12/10/17 0451  TROPONINI <0.03    BNP (last 3 results) No results for input(s): PROBNP in the last 8760 hours. CBG: No results for input(s): GLUCAP in the last 168 hours.  Radiological Exams on Admission: Ct Head Wo Contrast  Result Date: 12/10/2017 CLINICAL DATA:  Head trauma. Syncopal episode yesterday with dizziness. Tachycardia. EXAM: CT HEAD WITHOUT CONTRAST TECHNIQUE: Contiguous axial images were obtained from the base of the skull through the vertex without intravenous contrast. COMPARISON:  None. FINDINGS: Brain: There is no evidence of acute infarct, intracranial hemorrhage, mass, midline shift, or extra-axial fluid collection. The ventricles and sulci are normal. Periventricular white matter hypodensities are nonspecific but compatible with mild chronic small vessel ischemic disease. Vascular: No hyperdense vessel. Skull: No fracture or focal osseous lesion. Sinuses/Orbits: Visualized paranasal sinuses and mastoid air cells are clear. Orbits are unremarkable. Other: None. IMPRESSION: 1. No evidence of acute intracranial abnormality. 2. Mild chronic small vessel ischemic disease. Electronically Signed   By: Logan Bores M.D.   On: 12/10/2017 07:26   Ct Angio Chest Pe W And/or Wo Contrast  Result Date: 12/10/2017 CLINICAL DATA:  57 year old male with fever, chills, productive cough. Tachycardia. Syncopal episode yesterday. EXAM: CT ANGIOGRAPHY  CHEST WITH CONTRAST TECHNIQUE: Multidetector CT imaging of the chest was performed using the standard protocol during bolus administration of intravenous contrast. Multiplanar CT image reconstructions and MIPs were obtained to evaluate the vascular anatomy. CONTRAST:  164mL ISOVUE-370 IOPAMIDOL (ISOVUE-370) INJECTION 76% COMPARISON:  Chest radiographs 0514 hours today and earlier. FINDINGS: Cardiovascular: Good contrast bolus timing in the pulmonary arterial tree. No focal filling defect identified in the pulmonary arteries to suggest acute pulmonary embolism. Calcified coronary artery atherosclerosis (series 5, image 201). Mild cardiomegaly. No pericardial effusion. Negative visible aorta aside from mild calcified plaque. Mediastinum/Nodes: Negative thoracic inlet. Negative mediastinum. No lymphadenopathy. Lungs/Pleura: The major airways are patent.  Centrilobular emphysema Mild dependent atelectasis in both lungs. Fairly numerous (roughly 21-25) scattered solid and sub solid pulmonary nodules in both lungs. Many are sub solid and irregular and either peribronchial or peripheral/subpleural. The largest nodules are 7-8 millimeters (right upper lobe series 6, image 79 and left lower lobe image 142). No cavitary nodules. No consolidation. No pleural effusion or other abnormal pulmonary opacity. Upper Abdomen: Negative visible liver, spleen, pancreas, adrenal glands, kidneys, and bowel  in the left upper quadrant. Musculoskeletal: No acute osseous abnormality identified. Review of the MIP images confirms the above findings. IMPRESSION: 1. No evidence of acute pulmonary embolus. 2. Emphysema (ICD10-J43.9) with fairly numerous superimposed bilateral pulmonary solid and sub solid pulmonary nodules. These are nonspecific, but could be related to acute disseminated infection, or could be postinflammatory. None are cavitary, and there are no areas of consolidation or confluent opacity. No pleural effusion. Non-contrast chest  CT at 3-6 months is recommended. If the nodules are stable at time of repeat CT, then future CT at 18-24 months (from today's scan) is considered optional for low-risk patients, but is recommended for high-risk patients. This recommendation follows the consensus statement: Guidelines for Management of Incidental Pulmonary Nodules Detected on CT Images: From the Fleischner Society 2017; Radiology 2017; 284:228-243. 3. Mild cardiomegaly. Calcified coronary artery atherosclerosis. Electronically Signed   By: Genevie Ann M.D.   On: 12/10/2017 07:30   Dg Chest Port 1 View  Result Date: 12/10/2017 CLINICAL DATA:  Cough, chills and fever. EXAM: PORTABLE CHEST 1 VIEW COMPARISON:  Chest radiograph May 31, 2013 FINDINGS: Cardiac silhouette is upper limits of normal size. Calcified aortic arch. No pleural effusion or focal consolidation. No pneumothorax. Slightly asymmetrically dense LEFT mid lung zone favoring soft tissue attenuation without focal consolidation or pleural effusion. No pneumothorax. Soft tissue planes and included osseous structures are non suspicious. IMPRESSION: Asymmetric density LEFT mid lung zone favored as soft tissue attenuation though, recommend follow-up PA and lateral views of the chest when clinically able. Borderline cardiomegaly. Aortic Atherosclerosis (ICD10-I70.0). Electronically Signed   By: Elon Alas M.D.   On: 12/10/2017 05:53    EKG: Independently reviewed. Atrial Fib seen on initial EKG, aflutter with rvr seen on 2nd/3rd tracings.  Assessment/Plan Principal Problem:   Tachycardia Active Problems:   Sepsis (HCC)   Atrial fibrillation, transient (HCC)   Syncopal episodes   Abnormal EKG   Hyperglycemia   Calcium blood decreased   Smoking  1. Tachycardia - HR in the 130s and 140s. Have ordered consult with cardiology. Patient not responding to adenosine IV. Patient was started on diltiazem drip heart rate still in the 130s. Heparin was added to protect against  Atrial fibrillation. Patient had transient run of afib when he first presented to the ED. Troponin was normal. Have added on a TSH, T3, and free T4 to check thyroid involvement. Patient admitted to [facility] Will continue to monitor. 2. Sepsis - Patient has been given 2 L of NS fluid bolus. His initial lactic acid was 5.22. Repeat after bolus was 1.39. Blood culture were drawn, awaiting results. Vancomycin and zosyn ordered in ED in lieu of culture results. WBC was 8.7. Will continue to monitor with morning labs. 3. Atrial fibrillation, transient - Initial EKG showed atrial fibrillation. Subsequent EKG showed Afib had resolved but patient was had tachycardia with HR in 140's. Patient was started on Heparin IV and diltiazem IV. Will continue to monitor while in-patient. 4. Syncopal episodes - Patient states near syncopal episode yesterday. He denies fall or hitting head. He states he has not been eating or drinking much in the past week since starting to "feel under the weather." CT of head was unremarkable. Will continue to monitor with neuro checks every 4 hours for the next 24 hours. 5. Abnormal EKG - Atrial fibrillation on first EKG, subsequent EKG showed absence of Afib showing only sinus tachycardia in the 140's. Will continue current treatment and monitor. Repeat EKG in the morning.  6. Hyperglycemia - Patient's CBG initially was 196. No previous history of diabetes. Have ordered A1c. Results pending. Will continue to monitor with morning labs. 7. Calcium blood decreased - Calcium was slightly decreased at 8.8. Will monitor with labs in the morning. 8. Smoking - Patient has a history of smoking 1 pack per day for 20 years. Have ordered 21 mg nicotine patch every 24 hours. Will order smoking cessation education at discharge.  DVT Prophylaxis: Lovenox/ SCDs Code Status: FULL CODE  Family Communication: Family at bedside  Disposition Plan: Discharge home pending return to baseline and consult with  cardiology. Patient will need to be set up with PCP at discharge.   Time spent: [address] Student AGACNP  Irwin Brakeman, MD [facility] Pager [phone]  If 7PM-7AM, please contact night-coverage [email or web address] Password [facility] 12/10/2017, 8:55 AM

## 2017-12-10 NOTE — ED Notes (Signed)
Pt placed on 2L oxygen [place] because of increased heart rate

## 2017-12-10 NOTE — ED Provider Notes (Signed)
[facility] EMERGENCY DEPARTMENT Provider Note   CSN: [id number] Arrival date & time: 12/10/17  0424     History   Chief Complaint Chief Complaint  Patient presents with  . Cough    HPI Nicholas Avila is a 57 y.o. male.  Patient presents with 1 week of chills, subjective fever, productive cough of yellow sputum, decreased p.o. intake over the past 1 week.  He had a syncopal episode while in the yard yesterday with dizziness.  States he began to feel lightheaded and went to grab a railing but fell striking his head and lost consciousness.  This happened yesterday morning.  He is been laying in bed for most of the day until now.  He states he has had a cough and chills all week but not had a documented fever.  He does smoke cigarettes.  Denies any sick contacts.  No chronic medical conditions or chronic medications.  No abdominal pain, vomiting or diarrhea.  The history is provided by the patient and a relative. The history is limited by the condition of the patient.  Cough  Associated symptoms include chills, headaches, myalgias and shortness of breath. Pertinent negatives include no chest pain and no rhinorrhea.    History reviewed. No pertinent past medical history.  There are no active problems to display for this patient.   History reviewed. No pertinent surgical history.      Home Medications    Prior to Admission medications   Medication Sig Start Date End Date Taking? Authorizing Provider  azithromycin (ZITHROMAX Z-PAK) 250 MG tablet Two tablets first day and then one a day 05/31/13   Fransico Meadow, PA-C  benzonatate (TESSALON) 100 MG capsule Take 1 capsule (100 mg total) by mouth every 8 (eight) hours. 05/31/13   Fransico Meadow, PA-C  DM-Phenylephrine-Acetaminophen (ALKA-SELTZER PLS SINUS & COUGH PO) Take 1 tablet by mouth daily as needed (cold symptoms).    [provider]    Family History No family history on file.  Social History Social History    Tobacco Use  . Smoking status: Current Every Day Smoker    Packs/day: 0.50    Types: Cigarettes  . Smokeless tobacco: Never Used  Substance Use Topics  . Alcohol use: Yes    Comment: beer every day  . Drug use: Yes    Types: Marijuana     Allergies   Patient has no known allergies.   Review of Systems Review of Systems  Constitutional: Positive for activity change, appetite change, chills, fatigue and fever.  HENT: Negative for congestion and rhinorrhea.   Eyes: Negative for visual disturbance.  Respiratory: Positive for cough and shortness of breath. Negative for chest tightness.   Cardiovascular: Negative for chest pain.  Gastrointestinal: Negative for abdominal pain, nausea and vomiting.  Genitourinary: Negative for dysuria, hematuria and urgency.  Musculoskeletal: Positive for arthralgias and myalgias.  Skin: Negative for rash.  Neurological: Positive for dizziness, syncope, weakness, light-headedness and headaches.   all other systems are negative except as noted in the HPI and PMH.     Physical Exam Updated Vital Signs BP (!) 141/110   Pulse (!) 140   Temp 99.2 F (37.3 C) (Rectal)   Resp 16   Ht 5\' 11"  (1.803 m)   Wt 61.2 kg (135 lb)   SpO2 100%   BMI 18.83 kg/m   Physical Exam  Constitutional: He is oriented to person, place, and time. He appears well-developed and well-nourished. He appears distressed.  Ill-appearing with shaking chills  HENT:  Head: Normocephalic and atraumatic.  Mouth/Throat: Oropharynx is clear and moist. No oropharyngeal exudate.  Dry mucous membranes  Eyes: Pupils are equal, round, and reactive to light. Conjunctivae and EOM are normal.  Neck: Normal range of motion. Neck supple.  No meningismus.  Cardiovascular: Normal rate, normal heart sounds and intact distal pulses.  No murmur heard. Tachycardic 140s  Pulmonary/Chest: Effort normal and breath sounds normal. No respiratory distress.  Scattered rhonchi  Abdominal:  Soft. There is no tenderness. There is no rebound and no guarding.  Musculoskeletal: Normal range of motion. He exhibits no edema or tenderness.  Neurological: He is alert and oriented to person, place, and time. No cranial nerve deficit. He exhibits normal muscle tone. Coordination normal.  No ataxia on finger to nose bilaterally. No pronator drift. 5/5 strength throughout. CN 2-12 intact.Equal grip strength. Sensation intact.   Skin: Skin is warm.  Psychiatric: He has a normal mood and affect. His behavior is normal.  Nursing note and vitals reviewed.    ED Treatments / Results  Labs (all labs ordered are listed, but only abnormal results are displayed) Labs Reviewed  COMPREHENSIVE METABOLIC PANEL - Abnormal; Notable for the following components:      Result Value   Chloride 97 (*)    Glucose, Bld 196 (*)    Calcium 8.8 (*)    AST 46 (*)    Alkaline Phosphatase 146 (*)    All other components within normal limits  CBC WITH DIFFERENTIAL/PLATELET - Abnormal; Notable for the following components:   RBC 4.13 (*)    All other components within normal limits  URINALYSIS, ROUTINE W REFLEX MICROSCOPIC - Abnormal; Notable for the following components:   Color, Urine STRAW (*)    Glucose, UA 50 (*)    Hgb urine dipstick SMALL (*)    Protein, ur 100 (*)    All other components within normal limits  I-STAT CG4 LACTIC ACID, ED - Abnormal; Notable for the following components:   Lactic Acid, Venous 5.22 (*)    All other components within normal limits  CULTURE, BLOOD (ROUTINE X 2)  CULTURE, BLOOD (ROUTINE X 2)  TROPONIN I  PROTIME-INR  APTT  HEPARIN LEVEL (UNFRACTIONATED)  I-STAT CG4 LACTIC ACID, ED    EKG EKG Interpretation  Date/Time:  Monday December 10 2017 06:18:51 EDT Ventricular Rate:  141 PR Interval:    QRS Duration: 94 QT Interval:  342 QTC Calculation: 524 R Axis:   99 Text Interpretation:  Sinus or ectopic atrial tachycardia Borderline right axis deviation Probable  left ventricular hypertrophy Repol abnrm suggests ischemia, inferior leads Prolonged QT interval Sinus tachycardia versus atrial flutter Confirmed by Ezequiel Essex [phone]) on 12/10/2017 6:30:42 AM   Radiology Ct Head Wo Contrast  Result Date: 12/10/2017 CLINICAL DATA:  Head trauma. Syncopal episode yesterday with dizziness. Tachycardia. EXAM: CT HEAD WITHOUT CONTRAST TECHNIQUE: Contiguous axial images were obtained from the base of the skull through the vertex without intravenous contrast. COMPARISON:  None. FINDINGS: Brain: There is no evidence of acute infarct, intracranial hemorrhage, mass, midline shift, or extra-axial fluid collection. The ventricles and sulci are normal. Periventricular white matter hypodensities are nonspecific but compatible with mild chronic small vessel ischemic disease. Vascular: No hyperdense vessel. Skull: No fracture or focal osseous lesion. Sinuses/Orbits: Visualized paranasal sinuses and mastoid air cells are clear. Orbits are unremarkable. Other: None. IMPRESSION: 1. No evidence of acute intracranial abnormality. 2. Mild chronic small vessel ischemic disease. Electronically Signed  By: Logan Bores M.D.   On: 12/10/2017 07:26   Ct Angio Chest Pe W And/or Wo Contrast  Result Date: 12/10/2017 CLINICAL DATA:  57 year old male with fever, chills, productive cough. Tachycardia. Syncopal episode yesterday. EXAM: CT ANGIOGRAPHY CHEST WITH CONTRAST TECHNIQUE: Multidetector CT imaging of the chest was performed using the standard protocol during bolus administration of intravenous contrast. Multiplanar CT image reconstructions and MIPs were obtained to evaluate the vascular anatomy. CONTRAST:  141mL ISOVUE-370 IOPAMIDOL (ISOVUE-370) INJECTION 76% COMPARISON:  Chest radiographs 0514 hours today and earlier. FINDINGS: Cardiovascular: Good contrast bolus timing in the pulmonary arterial tree. No focal filling defect identified in the pulmonary arteries to suggest acute pulmonary  embolism. Calcified coronary artery atherosclerosis (series 5, image 201). Mild cardiomegaly. No pericardial effusion. Negative visible aorta aside from mild calcified plaque. Mediastinum/Nodes: Negative thoracic inlet. Negative mediastinum. No lymphadenopathy. Lungs/Pleura: The major airways are patent.  Centrilobular emphysema Mild dependent atelectasis in both lungs. Fairly numerous (roughly 21-25) scattered solid and sub solid pulmonary nodules in both lungs. Many are sub solid and irregular and either peribronchial or peripheral/subpleural. The largest nodules are 7-8 millimeters (right upper lobe series 6, image 79 and left lower lobe image 142). No cavitary nodules. No consolidation. No pleural effusion or other abnormal pulmonary opacity. Upper Abdomen: Negative visible liver, spleen, pancreas, adrenal glands, kidneys, and bowel in the left upper quadrant. Musculoskeletal: No acute osseous abnormality identified. Review of the MIP images confirms the above findings. IMPRESSION: 1. No evidence of acute pulmonary embolus. 2. Emphysema (ICD10-J43.9) with fairly numerous superimposed bilateral pulmonary solid and sub solid pulmonary nodules. These are nonspecific, but could be related to acute disseminated infection, or could be postinflammatory. None are cavitary, and there are no areas of consolidation or confluent opacity. No pleural effusion. Non-contrast chest CT at 3-6 months is recommended. If the nodules are stable at time of repeat CT, then future CT at 18-24 months (from today's scan) is considered optional for low-risk patients, but is recommended for high-risk patients. This recommendation follows the consensus statement: Guidelines for Management of Incidental Pulmonary Nodules Detected on CT Images: From the Fleischner Society 2017; Radiology 2017; 284:228-243. 3. Mild cardiomegaly. Calcified coronary artery atherosclerosis. Electronically Signed   By: Genevie Ann M.D.   On: 12/10/2017 07:30   Dg  Chest Port 1 View  Result Date: 12/10/2017 CLINICAL DATA:  Cough, chills and fever. EXAM: PORTABLE CHEST 1 VIEW COMPARISON:  Chest radiograph May 31, 2013 FINDINGS: Cardiac silhouette is upper limits of normal size. Calcified aortic arch. No pleural effusion or focal consolidation. No pneumothorax. Slightly asymmetrically dense LEFT mid lung zone favoring soft tissue attenuation without focal consolidation or pleural effusion. No pneumothorax. Soft tissue planes and included osseous structures are non suspicious. IMPRESSION: Asymmetric density LEFT mid lung zone favored as soft tissue attenuation though, recommend follow-up PA and lateral views of the chest when clinically able. Borderline cardiomegaly. Aortic Atherosclerosis (ICD10-I70.0). Electronically Signed   By: Elon Alas M.D.   On: 12/10/2017 05:53    Procedures .Cardioversion Date/Time: 12/10/2017 6:47 AM Performed by: Ezequiel Essex, MD Authorized by: Ezequiel Essex, MD   Consent:    Consent obtained:  Verbal   Consent given by:  Patient   Risks discussed:  Induced arrhythmia   Alternatives discussed:  Rate-control medication and delayed treatment Pre-procedure details:    Cardioversion basis:  Emergent   Rhythm:  Atrial flutter   Electrode placement:  Anterior-posterior Patient sedated: No Attempt one:    Shock outcome:  No change in rhythm Post-procedure details:    Patient status:  Awake   Patient tolerance of procedure:  Tolerated well, no immediate complications Comments:     Chemical cardioversion attempted with adenosine.     (including critical care time)  Medications Ordered in ED Medications  piperacillin-tazobactam (ZOSYN) IVPB 3.375 g (has no administration in time range)  vancomycin (VANCOCIN) IVPB 1000 mg/200 mL premix (has no administration in time range)  sodium chloride 0.9 % bolus 1,000 mL (has no administration in time range)    And  sodium chloride 0.9 % bolus 1,000 mL (has no  administration in time range)     Initial Impression / Assessment and Plan / ED Course  I have reviewed the triage vital signs and the nursing notes.  Pertinent labs & imaging results that were available during my care of the patient were reviewed by me and considered in my medical decision making (see chart for details).    Patient with 1 week of chills, cough, subjective fever with syncopal episode today.  He appears ill with dry mucous membranes and tachycardia. Code sepsis activated.  Afebrile in the ED 99.2.  Patient given IV fluids and broad-spectrum antibiotics after cultures obtained.  Patient given IV fluids and IV antibiotics.  Chest x-ray suspicious for left-sided pneumonia. Patient remains tachycardic to the 140s despite IV fluids and IV metoprolol.  EKG shows what appears to be sinus tachycardia without actual flutter is considered.  Heart rate has not improved with IV metoprolol IV Cardizem.  Patient with stable mental status and stable blood pressure.  Initial lactate is 5.  Heart rate appears to slow to be SVT.  Appears very regular so consider atrial tachycardia versus sinus tachycardia or atrial flutter. No change in rhythm with adenosine.  Admission discussed with Dr. Darrick Meigs.  He request discussed with cardiology before accepting patient to this hospital.  At shift change, cardiology consult call has not been returned.  Patient's heart rate remained unresponsive to adenosine, metoprolol, and Cardizem.  Suspect sinus tachycardia versus atrial flutter.  Blood pressure and mental status remained stable. 15 mg cardizem gtt continued.  CT shows no pulmonary embolism.  Does show multiple lung nodules that are nonspecific.  Initial lactic acidosis has cleared with IV fluid resuscitation.  Patient remains comfortable on nasal cannula.  Admission to hospital discussed with NP Student Patraw for Dr. Wynetta Emery.  Will need cardiology consult at [place]. Dr. Reather Converse aware of patient in the  ED.    CRITICAL CARE Performed by: Ezequiel Essex Total critical care time: 60 minutes Critical care time was exclusive of separately billable procedures and treating other patients. Critical care was necessary to treat or prevent imminent or life-threatening deterioration. Critical care was time spent personally by me on the following activities: development of treatment plan with patient and/or surrogate as well as nursing, discussions with consultants, evaluation of patient's response to treatment, examination of patient, obtaining history from patient or surrogate, ordering and performing treatments and interventions, ordering and review of laboratory studies, ordering and review of radiographic studies, pulse oximetry and re-evaluation of patient's condition.   Final Clinical Impressions(s) / ED Diagnoses   Final diagnoses:  Sepsis, due to unspecified organism [facility])  Atrial flutter with rapid ventricular response [facility])    ED Discharge Orders    None       Ezequiel Essex, MD 12/10/17 [phone]

## 2017-12-11 ENCOUNTER — Inpatient Hospital Stay (HOSPITAL_COMMUNITY): Payer: BLUE CROSS/BLUE SHIELD

## 2017-12-11 ENCOUNTER — Encounter (HOSPITAL_COMMUNITY): Payer: Self-pay

## 2017-12-11 DIAGNOSIS — I340 Nonrheumatic mitral (valve) insufficiency: Secondary | ICD-10-CM

## 2017-12-11 LAB — CBC
HCT: 37.6 % — ABNORMAL LOW (ref 39.0–52.0)
HEMOGLOBIN: 12.3 g/dL — AB (ref 13.0–17.0)
MCH: 30.9 pg (ref 26.0–34.0)
MCHC: 32.7 g/dL (ref 30.0–36.0)
MCV: 94.5 fL (ref 78.0–100.0)
Platelets: 242 10*3/uL (ref 150–400)
RBC: 3.98 MIL/uL — ABNORMAL LOW (ref 4.22–5.81)
RDW: 14.9 % (ref 11.5–15.5)
WBC: 7.4 10*3/uL (ref 4.0–10.5)

## 2017-12-11 LAB — COMPREHENSIVE METABOLIC PANEL
ALT: 25 U/L (ref 0–44)
ANION GAP: 9 (ref 5–15)
AST: 24 U/L (ref 15–41)
Albumin: 3.2 g/dL — ABNORMAL LOW (ref 3.5–5.0)
Alkaline Phosphatase: 120 U/L (ref 38–126)
BUN: <5 mg/dL — ABNORMAL LOW (ref 6–20)
CO2: 25 mmol/L (ref 22–32)
Calcium: 7.8 mg/dL — ABNORMAL LOW (ref 8.9–10.3)
Chloride: 104 mmol/L (ref 98–111)
Creatinine, Ser: 0.63 mg/dL (ref 0.61–1.24)
GFR calc Af Amer: >60 mL/min (ref >60)
GFR calc non Af Amer: >60 mL/min (ref >60)
Glucose, Bld: 102 mg/dL — ABNORMAL HIGH (ref 70–99)
POTASSIUM: 3.6 mmol/L (ref 3.5–5.1)
Sodium: 138 mmol/L (ref 135–145)
TOTAL PROTEIN: 6.3 g/dL — AB (ref 6.5–8.1)
Total Bilirubin: 0.9 mg/dL (ref 0.3–1.2)

## 2017-12-11 LAB — ECHOCARDIOGRAM COMPLETE
Height: 71 in
WEIGHTICAEL: 2148.16 [oz_av]

## 2017-12-11 LAB — HEPARIN LEVEL (UNFRACTIONATED)
HEPARIN UNFRACTIONATED: 0.11 [IU]/mL — AB (ref 0.30–0.70)
Heparin Unfractionated: 0.52 IU/mL (ref 0.30–0.70)

## 2017-12-11 LAB — HEMOGLOBIN A1C
HEMOGLOBIN A1C: 5.1 % (ref 4.8–5.6)
Mean Plasma Glucose: 99.67 mg/dL

## 2017-12-11 LAB — T3: T3, Total: 66 ng/dL — ABNORMAL LOW (ref 71–180)

## 2017-12-11 MED ORDER — HEPARIN BOLUS VIA INFUSION
2000.0000 [IU] | Freq: Once | INTRAVENOUS | Status: AC
Start: 2017-12-11 — End: 2017-12-11
  Administered 2017-12-11: 2000 [IU] via INTRAVENOUS
  Filled 2017-12-11: qty 2000

## 2017-12-11 MED ORDER — METOPROLOL TARTRATE 50 MG PO TABS
50.0000 mg | ORAL_TABLET | Freq: Three times a day (TID) | ORAL | Status: DC
  Administered 2017-12-11 (×3): 50 mg via ORAL
  Filled 2017-12-11: qty 2
  Filled 2017-12-11: qty 1
  Filled 2017-12-11: qty 2

## 2017-12-11 MED ORDER — MAGNESIUM SULFATE 2 GM/50ML IV SOLN
2.0000 g | Freq: Once | INTRAVENOUS | Status: AC
  Administered 2017-12-11: 2 g via INTRAVENOUS
  Filled 2017-12-11: qty 50

## 2017-12-11 MED ORDER — POTASSIUM CHLORIDE CRYS ER 20 MEQ PO TBCR
40.0000 meq | EXTENDED_RELEASE_TABLET | ORAL | Status: AC
  Administered 2017-12-11 (×2): 40 meq via ORAL
  Filled 2017-12-11 (×2): qty 2

## 2017-12-11 NOTE — Progress Notes (Signed)
Progress Note  Patient Name: Nicholas Avila Date of Encounter: 12/11/2017  Primary Cardiologist: Carlyle Dolly, MD   Subjective   No complaints  Inpatient Medications    Scheduled Meds: . folic acid  1 mg Oral Daily  . levofloxacin  750 mg Oral Daily  . LORazepam  0-4 mg Oral Q6H   Followed by  . [START ON 12/12/2017] LORazepam  0-4 mg Oral Q12H  . metoprolol tartrate  5 mg Intravenous Q8H  . multivitamin with minerals  1 tablet Oral Daily  . nicotine  21 mg Transdermal Daily  . pantoprazole  40 mg Oral Q0600  . senna  1 tablet Oral BID  . sodium chloride flush  3 mL Intravenous Q12H  . thiamine  100 mg Oral Daily   Or  . thiamine  100 mg Intravenous Daily   Continuous Infusions: . sodium chloride 60 mL/hr at 12/11/17 0300  . diltiazem (CARDIZEM) infusion 12.5 mg/hr (12/11/17 0731)  . heparin 1,150 Units/hr (12/11/17 0300)   PRN Meds: acetaminophen **OR** acetaminophen, ipratropium-albuterol, LORazepam **OR** LORazepam, morphine injection, ondansetron **OR** ondansetron (ZOFRAN) IV, traZODone   Vital Signs    Vitals:   12/11/17 0300 12/11/17 0400 12/11/17 0500 12/11/17 0724  BP: 115/81 131/85 115/90   Pulse: 92 (!) 101 91 79  Resp: 16 (!) 21 18 19   Temp:  98.9 F (37.2 C)  98.4 F (36.9 C)  TempSrc:  Oral  Oral  SpO2: 98% 95% 97% 100%  Weight:  134 lb 4.2 oz (60.9 kg)    Height:        Intake/Output Summary (Last 24 hours) at 12/11/2017 0754 Last data filed at 12/11/2017 0529 Gross per 24 hour  Intake 1627.48 ml  Output 1000 ml  Net 627.48 ml   Filed Weights   12/10/17 0440 12/10/17 1249 12/11/17 0400  Weight: 135 lb (61.2 kg) 134 lb 14.7 oz (61.2 kg) 134 lb 4.2 oz (60.9 kg)    Telemetry    Aflutter rate controlled - Personally Reviewed  ECG    na - Personally Reviewed  Physical Exam   GEN: No acute distress.   Neck: No JVD Cardiac: irreg, no murmurs, rubs, or gallops.  Respiratory: Clear to auscultation bilaterally. GI: Soft,  nontender, non-distended  MS: No edema; No deformity. Neuro:  Nonfocal  Psych: Normal affect   Labs    Chemistry Recent Labs  Lab 12/10/17 0451 12/11/17 0402  NA 135 138  K 3.6 3.6  CL 97* 104  CO2 23 25  GLUCOSE 196* 102*  BUN 9 <5*  CREATININE 1.12 0.63  CALCIUM 8.8* 7.8*  PROT 7.1 6.3*  ALBUMIN 3.7 3.2*  AST 46* 24  ALT 33 25  ALKPHOS 146* 120  BILITOT 0.7 0.9  GFRNONAA >60 >60  GFRAA >60 >60  ANIONGAP 15 9     Hematology Recent Labs  Lab 12/10/17 0451 12/11/17 0402  WBC 8.7 7.4  RBC 4.13* 3.98*  HGB 13.1 12.3*  HCT 39.2 37.6*  MCV 94.9 94.5  MCH 31.7 30.9  MCHC 33.4 32.7  RDW 15.1 14.9  PLT 272 242    Cardiac Enzymes Recent Labs  Lab 12/10/17 0451  TROPONINI <0.03   No results for input(s): TROPIPOC in the last 168 hours.   BNPNo results for input(s): BNP, PROBNP in the last 168 hours.   DDimer No results for input(s): DDIMER in the last 168 hours.   Radiology    Ct Head Wo Contrast  Result Date: 12/10/2017 CLINICAL  DATA:  Head trauma. Syncopal episode yesterday with dizziness. Tachycardia. EXAM: CT HEAD WITHOUT CONTRAST TECHNIQUE: Contiguous axial images were obtained from the base of the skull through the vertex without intravenous contrast. COMPARISON:  None. FINDINGS: Brain: There is no evidence of acute infarct, intracranial hemorrhage, mass, midline shift, or extra-axial fluid collection. The ventricles and sulci are normal. Periventricular white matter hypodensities are nonspecific but compatible with mild chronic small vessel ischemic disease. Vascular: No hyperdense vessel. Skull: No fracture or focal osseous lesion. Sinuses/Orbits: Visualized paranasal sinuses and mastoid air cells are clear. Orbits are unremarkable. Other: None. IMPRESSION: 1. No evidence of acute intracranial abnormality. 2. Mild chronic small vessel ischemic disease. Electronically Signed   By: Logan Bores M.D.   On: 12/10/2017 07:26   Ct Angio Chest Pe W And/or Wo  Contrast  Result Date: 12/10/2017 CLINICAL DATA:  57 year old male with fever, chills, productive cough. Tachycardia. Syncopal episode yesterday. EXAM: CT ANGIOGRAPHY CHEST WITH CONTRAST TECHNIQUE: Multidetector CT imaging of the chest was performed using the standard protocol during bolus administration of intravenous contrast. Multiplanar CT image reconstructions and MIPs were obtained to evaluate the vascular anatomy. CONTRAST:  146mL ISOVUE-370 IOPAMIDOL (ISOVUE-370) INJECTION 76% COMPARISON:  Chest radiographs 0514 hours today and earlier. FINDINGS: Cardiovascular: Good contrast bolus timing in the pulmonary arterial tree. No focal filling defect identified in the pulmonary arteries to suggest acute pulmonary embolism. Calcified coronary artery atherosclerosis (series 5, image 201). Mild cardiomegaly. No pericardial effusion. Negative visible aorta aside from mild calcified plaque. Mediastinum/Nodes: Negative thoracic inlet. Negative mediastinum. No lymphadenopathy. Lungs/Pleura: The major airways are patent.  Centrilobular emphysema Mild dependent atelectasis in both lungs. Fairly numerous (roughly 21-25) scattered solid and sub solid pulmonary nodules in both lungs. Many are sub solid and irregular and either peribronchial or peripheral/subpleural. The largest nodules are 7-8 millimeters (right upper lobe series 6, image 79 and left lower lobe image 142). No cavitary nodules. No consolidation. No pleural effusion or other abnormal pulmonary opacity. Upper Abdomen: Negative visible liver, spleen, pancreas, adrenal glands, kidneys, and bowel in the left upper quadrant. Musculoskeletal: No acute osseous abnormality identified. Review of the MIP images confirms the above findings. IMPRESSION: 1. No evidence of acute pulmonary embolus. 2. Emphysema (ICD10-J43.9) with fairly numerous superimposed bilateral pulmonary solid and sub solid pulmonary nodules. These are nonspecific, but could be related to acute  disseminated infection, or could be postinflammatory. None are cavitary, and there are no areas of consolidation or confluent opacity. No pleural effusion. Non-contrast chest CT at 3-6 months is recommended. If the nodules are stable at time of repeat CT, then future CT at 18-24 months (from today's scan) is considered optional for low-risk patients, but is recommended for high-risk patients. This recommendation follows the consensus statement: Guidelines for Management of Incidental Pulmonary Nodules Detected on CT Images: From the Fleischner Society 2017; Radiology 2017; 284:228-243. 3. Mild cardiomegaly. Calcified coronary artery atherosclerosis. Electronically Signed   By: Genevie Ann M.D.   On: 12/10/2017 07:30   Dg Chest Port 1 View  Result Date: 12/10/2017 CLINICAL DATA:  Cough, chills and fever. EXAM: PORTABLE CHEST 1 VIEW COMPARISON:  Chest radiograph May 31, 2013 FINDINGS: Cardiac silhouette is upper limits of normal size. Calcified aortic arch. No pleural effusion or focal consolidation. No pneumothorax. Slightly asymmetrically dense LEFT mid lung zone favoring soft tissue attenuation without focal consolidation or pleural effusion. No pneumothorax. Soft tissue planes and included osseous structures are non suspicious. IMPRESSION: Asymmetric density LEFT mid lung zone favored as  soft tissue attenuation though, recommend follow-up PA and lateral views of the chest when clinically able. Borderline cardiomegaly. Aortic Atherosclerosis (ICD10-I70.0). Electronically Signed   By: Elon Alas M.D.   On: 12/10/2017 05:53    Cardiac Studies    Patient Profile     57 y.o. male admitted with pneumonia/sepsis as well as new onset aflutter with RVR.   Assessment & Plan    1. Aflutter - new diagnosis this admission, unclear if isolated episode in setting of acute illness/infection - rate controlled with IV dilt gtt and IV lopressor. Started on heparin gtt - rates this AM controlled, remains in  aflutter.- start lopressor 50mg  po tid, wean IV dilt gtt.  - f/u echo today - based on known history he has a CHADS2Vasc score of 0 (need to f/u HgbA1c, echo). Started on anticoag in the possibility he may require cardioversion at some point.  - keep K at 4, Mg at 2. Replacement per primary team.    2. Pneumonia - per primary team   For questions or updates, please contact [place] Please consult [email or web address] for contact info under Cardiology/STEMI.      Merrily Pew, MD  12/11/2017, 7:54 AM

## 2017-12-11 NOTE — Progress Notes (Signed)
ANTICOAGULATION CONSULT NOTE Pharmacy Consult for Heparin Indication: Atrial fibrillation  No Known Allergies  Patient Measurements: Height: 5\' 11"  (180.3 cm) Weight: 134 lb 4.2 oz (60.9 kg) IBW/kg (Calculated) : 75.3 HEPARIN DW (KG): 61.2   Vital Signs: Temp: 97.7 F (36.5 C) (07/30 1105) Temp Source: Oral (07/30 1105) BP: 103/76 (07/30 1540) Pulse Rate: 72 (07/30 1640)  Labs: Recent Labs    12/10/17 0451 12/10/17 1358 12/11/17 0402 12/11/17 1511  HGB 13.1  --  12.3*  --   HCT 39.2  --  37.6*  --   PLT 272  --  242  --   APTT 32  --   --   --   LABPROT 12.7  --   --   --   INR 0.96  --   --   --   HEPARINUNFRC  --  0.11* 0.11* 0.52  CREATININE 1.12  --  0.63  --   TROPONINI <0.03  --   --   --    Estimated Creatinine Clearance: 88.8 mL/min (by C-G formula based on SCr of 0.63 mg/dL).  Medical History: History reviewed. No pertinent past medical history.  Medications:   Assessment: 57 yo male presented to the ED with chills, fever and cough.  Code sepsis activated. Pt also tachycardic. Cardiology consult ordered. Pharmacy has been asked to provide IV heparin dosing.  Heparin level therapeutic at 0.52  Goal of Therapy:  Heparin level goal: 0.3-0.7 units/ml Monitor platelets per anticoagulation protocol: Yes   Plan:  Continue Heparin infusion at 1350 units/hr Heparin level in 6-8 hrs with AM labs. Monitor for signs and symptoms of bleeding.   Ramond Craver, RPH 12/11/2017,5:05 PM

## 2017-12-11 NOTE — Progress Notes (Signed)
PROGRESS NOTE    Nicholas Avila  ERX:[id number] DOB: 04-03-1961 DOA: 12/10/2017 PCP: Patient, No Pcp Per    Brief Narrative:  57 year old male admitted to the hospital with fever, cough, feeling generally unwell.  Found to have evidence of pulmonary infection/sepsis.  Also had new onset atrial flutter with rapid ventricular response.  Started on intravenous Cardizem as well as intravenous heparin.  Cardiology following.   Assessment & Plan:   Principal Problem:   Tachycardia Active Problems:   Syncopal episodes   Abnormal EKG   Hyperglycemia   Sepsis (HCC)   Calcium blood decreased   Atrial fibrillation, transient (HCC)   Smoking   1. Sepsis.  Possibly related to bronchitis.  No evidence of pneumonia on CT imaging.  Received IV antibiotics in the emergency room, these have been transitioned to oral Levaquin.  Blood cultures show no growth.  Hemodynamics have stabilized after receiving IV fluids and lactic acid has trended down to normal.  We will continue to monitor.   2. Atrial flutter with RVR.  Currently on Cardizem infusion.  He is also on oral metoprolol with hopes of weaning off Cardizem.  Anticoagulated with heparin.  Echocardiogram has been ordered. 3. Near syncope.  Patient reported a near syncopal episode prior to admission.  CT head was unremarkable.  Suspect is related to volume depletion and dehydration.  Symptoms have resolved. 4. Acute bronchitis.  No evidence of pneumonia on CT imaging.  Continue supportive management. 5. Pulmonary nodules.  Repeat CT chest in 3 to 6 months. 6. Tobacco use.  Counseled on the importance of tobacco cessation.   DVT prophylaxis: heparin infusion Code Status: full code Family Communication: no family present Disposition Plan: discharge home when improved   Consultants:   cardiology  Procedures:  Echo: - Left ventricle: The cavity size was normal. Wall thickness was   normal. Systolic function was normal. The estimated  ejection   fraction was in the range of 50% to 55%. Wall motion was normal;   there were no regional wall motion abnormalities. The study is   not technically sufficient to allow evaluation of LV diastolic   function. - Aortic valve: Mildly calcified annulus. Trileaflet; normal   thickness leaflets. Valve area (VTI): 2.53 cm^2. Valve area   (Vmax): 2.77 cm^2. - Mitral valve: There was mild regurgitation. - Left atrium: The atrium was mildly dilated. - Right ventricle: The cavity size was mildly to moderately   dilated. Systolic function was mildly reduced. - Right atrium: The atrium was moderately dilated. - Tricuspid valve: There was moderate regurgitation. - Pulmonary arteries: Systolic pressure was mildly to moderately   increased. PA peak pressure: 39 mm Hg (S).  - Technically adequate study.  Antimicrobials:       Subjective: Feeling better today.  No shortness of breath, cough, chest pain  Objective: Vitals:   12/11/17 1640 12/11/17 1723 12/11/17 1736 12/11/17 1800  BP:  115/85 109/74 112/75  Pulse: 72 89 89 77  Resp:    13  Temp:      TempSrc:      SpO2:    95%  Weight:      Height:        Intake/Output Summary (Last 24 hours) at 12/11/2017 1855 Last data filed at 12/11/2017 0851 Gross per 24 hour  Intake 1582.48 ml  Output 1000 ml  Net 582.48 ml   Filed Weights   12/10/17 0440 12/10/17 1249 12/11/17 0400  Weight: 61.2 kg (135 lb) 61.2 kg (134  lb 14.7 oz) 60.9 kg (134 lb 4.2 oz)    Examination:  General exam: Appears calm and comfortable  Respiratory system: Clear to auscultation. Respiratory effort normal. Cardiovascular system: S1 & S2 heard, irregular. No JVD, murmurs, rubs, gallops or clicks. No pedal edema. Gastrointestinal system: Abdomen is nondistended, soft and nontender. No organomegaly or masses felt. Normal bowel sounds heard. Central nervous system: Alert and oriented. No focal neurological deficits. Extremities: Symmetric 5 x 5  power. Skin: No rashes, lesions or ulcers Psychiatry: Judgement and insight appear normal. Mood & affect appropriate.     Data Reviewed: I have personally reviewed following labs and imaging studies  CBC: Recent Labs  Lab 12/10/17 0451 12/11/17 0402  WBC 8.7 7.4  NEUTROABS 6.5  --   HGB 13.1 12.3*  HCT 39.2 37.6*  MCV 94.9 94.5  PLT 272 191   Basic Metabolic Panel: Recent Labs  Lab 12/10/17 0451 12/10/17 0923 12/11/17 0402  NA 135  --  138  K 3.6  --  3.6  CL 97*  --  104  CO2 23  --  25  GLUCOSE 196*  --  102*  BUN 9  --  <5*  CREATININE 1.12  --  0.63  CALCIUM 8.8*  --  7.8*  MG  --  1.6*  --    GFR: Estimated Creatinine Clearance: 88.8 mL/min (by C-G formula based on SCr of 0.63 mg/dL). Liver Function Tests: Recent Labs  Lab 12/10/17 0451 12/11/17 0402  AST 46* 24  ALT 33 25  ALKPHOS 146* 120  BILITOT 0.7 0.9  PROT 7.1 6.3*  ALBUMIN 3.7 3.2*   No results for input(s): LIPASE, AMYLASE in the last 168 hours. No results for input(s): AMMONIA in the last 168 hours. Coagulation Profile: Recent Labs  Lab 12/10/17 0451  INR 0.96   Cardiac Enzymes: Recent Labs  Lab 12/10/17 0451  TROPONINI <0.03   BNP (last 3 results) No results for input(s): PROBNP in the last 8760 hours. HbA1C: Recent Labs    12/10/17 0451  HGBA1C 5.1   CBG: No results for input(s): GLUCAP in the last 168 hours. Lipid Profile: No results for input(s): CHOL, HDL, LDLCALC, TRIG, CHOLHDL, LDLDIRECT in the last 72 hours. Thyroid Function Tests: Recent Labs    12/10/17 0923  TSH 2.081  FREET4 0.60*   Anemia Panel: No results for input(s): VITAMINB12, FOLATE, FERRITIN, TIBC, IRON, RETICCTPCT in the last 72 hours. Sepsis Labs: Recent Labs  Lab 12/10/17 0457 12/10/17 0737  LATICACIDVEN 5.22* 1.39    Recent Results (from the past 240 hour(s))  Blood Culture (routine x 2)     Status: None (Preliminary result)   Collection Time: 12/10/17  4:43 AM  Result Value Ref  Range Status   Specimen Description BLOOD RIGHT ARM  Final   Special Requests   Final    BOTTLES DRAWN AEROBIC AND ANAEROBIC Blood Culture adequate volume   Culture   Final    NO GROWTH 1 DAY Performed at [facility], [address] [place], [place] [address]    Report Status PENDING  Incomplete  Blood Culture (routine x 2)     Status: None (Preliminary result)   Collection Time: 12/10/17  4:48 AM  Result Value Ref Range Status   Specimen Description BLOOD LEFT ARM  Final   Special Requests   Final    BOTTLES DRAWN AEROBIC AND ANAEROBIC Blood Culture adequate volume   Culture   Final    NO GROWTH 1 DAY  Performed at [facility], [address], [place], [place] [address]    Report Status PENDING  Incomplete  MRSA PCR Screening     Status: None   Collection Time: 12/10/17  2:30 PM  Result Value Ref Range Status   MRSA by PCR NEGATIVE NEGATIVE Final    Comment:        The GeneXpert MRSA Assay (FDA approved for NASAL specimens only), is one component of a comprehensive MRSA colonization surveillance program. It is not intended to diagnose MRSA infection nor to guide or monitor treatment for MRSA infections. Performed at [facility], [address] [place], [place] [address]          Radiology Studies: Ct Head Wo Contrast  Result Date: 12/10/2017 CLINICAL DATA:  Head trauma. Syncopal episode yesterday with dizziness. Tachycardia. EXAM: CT HEAD WITHOUT CONTRAST TECHNIQUE: Contiguous axial images were obtained from the base of the skull through the vertex without intravenous contrast. COMPARISON:  None. FINDINGS: Brain: There is no evidence of acute infarct, intracranial hemorrhage, mass, midline shift, or extra-axial fluid collection. The ventricles and sulci are normal. Periventricular white matter hypodensities are nonspecific but compatible with mild chronic small vessel ischemic disease. Vascular: No hyperdense vessel. Skull: No fracture or focal osseous lesion.  Sinuses/Orbits: Visualized paranasal sinuses and mastoid air cells are clear. Orbits are unremarkable. Other: None. IMPRESSION: 1. No evidence of acute intracranial abnormality. 2. Mild chronic small vessel ischemic disease. Electronically Signed   By: Logan Bores M.D.   On: 12/10/2017 07:26   Ct Angio Chest Pe W And/or Wo Contrast  Result Date: 12/10/2017 CLINICAL DATA:  57 year old male with fever, chills, productive cough. Tachycardia. Syncopal episode yesterday. EXAM: CT ANGIOGRAPHY CHEST WITH CONTRAST TECHNIQUE: Multidetector CT imaging of the chest was performed using the standard protocol during bolus administration of intravenous contrast. Multiplanar CT image reconstructions and MIPs were obtained to evaluate the vascular anatomy. CONTRAST:  127mL ISOVUE-370 IOPAMIDOL (ISOVUE-370) INJECTION 76% COMPARISON:  Chest radiographs 0514 hours today and earlier. FINDINGS: Cardiovascular: Good contrast bolus timing in the pulmonary arterial tree. No focal filling defect identified in the pulmonary arteries to suggest acute pulmonary embolism. Calcified coronary artery atherosclerosis (series 5, image 201). Mild cardiomegaly. No pericardial effusion. Negative visible aorta aside from mild calcified plaque. Mediastinum/Nodes: Negative thoracic inlet. Negative mediastinum. No lymphadenopathy. Lungs/Pleura: The major airways are patent.  Centrilobular emphysema Mild dependent atelectasis in both lungs. Fairly numerous (roughly 21-25) scattered solid and sub solid pulmonary nodules in both lungs. Many are sub solid and irregular and either peribronchial or peripheral/subpleural. The largest nodules are 7-8 millimeters (right upper lobe series 6, image 79 and left lower lobe image 142). No cavitary nodules. No consolidation. No pleural effusion or other abnormal pulmonary opacity. Upper Abdomen: Negative visible liver, spleen, pancreas, adrenal glands, kidneys, and bowel in the left upper quadrant. Musculoskeletal:  No acute osseous abnormality identified. Review of the MIP images confirms the above findings. IMPRESSION: 1. No evidence of acute pulmonary embolus. 2. Emphysema (ICD10-J43.9) with fairly numerous superimposed bilateral pulmonary solid and sub solid pulmonary nodules. These are nonspecific, but could be related to acute disseminated infection, or could be postinflammatory. None are cavitary, and there are no areas of consolidation or confluent opacity. No pleural effusion. Non-contrast chest CT at 3-6 months is recommended. If the nodules are stable at time of repeat CT, then future CT at 18-24 months (from today's scan) is considered optional for low-risk patients, but is recommended for high-risk patients. This recommendation follows the  consensus statement: Guidelines for Management of Incidental Pulmonary Nodules Detected on CT Images: From the Fleischner Society 2017; Radiology 2017; 284:228-243. 3. Mild cardiomegaly. Calcified coronary artery atherosclerosis. Electronically Signed   By: Genevie Ann M.D.   On: 12/10/2017 07:30   Dg Chest Port 1 View  Result Date: 12/10/2017 CLINICAL DATA:  Cough, chills and fever. EXAM: PORTABLE CHEST 1 VIEW COMPARISON:  Chest radiograph May 31, 2013 FINDINGS: Cardiac silhouette is upper limits of normal size. Calcified aortic arch. No pleural effusion or focal consolidation. No pneumothorax. Slightly asymmetrically dense LEFT mid lung zone favoring soft tissue attenuation without focal consolidation or pleural effusion. No pneumothorax. Soft tissue planes and included osseous structures are non suspicious. IMPRESSION: Asymmetric density LEFT mid lung zone favored as soft tissue attenuation though, recommend follow-up PA and lateral views of the chest when clinically able. Borderline cardiomegaly. Aortic Atherosclerosis (ICD10-I70.0). Electronically Signed   By: Elon Alas M.D.   On: 12/10/2017 05:53        Scheduled Meds: . folic acid  1 mg Oral Daily  .  levofloxacin  750 mg Oral Daily  . LORazepam  0-4 mg Oral Q6H   Followed by  . [START ON 12/12/2017] LORazepam  0-4 mg Oral Q12H  . metoprolol tartrate  50 mg Oral TID  . multivitamin with minerals  1 tablet Oral Daily  . nicotine  21 mg Transdermal Daily  . pantoprazole  40 mg Oral Q0600  . senna  1 tablet Oral BID  . sodium chloride flush  3 mL Intravenous Q12H  . thiamine  100 mg Oral Daily   Or  . thiamine  100 mg Intravenous Daily   Continuous Infusions: . sodium chloride 60 mL/hr at 12/11/17 0300  . diltiazem (CARDIZEM) infusion 2.5 mg/hr (12/11/17 1640)  . heparin 1,350 Units/hr (12/11/17 1941)     LOS: 1 day    Time spent: 67mins    Kathie Dike, MD [facility] Pager [phone]  If 7PM-7AM, please contact night-coverage [email or web address] Password [facility] 12/11/2017, 6:55 PM

## 2017-12-11 NOTE — Progress Notes (Signed)
*  PRELIMINARY RESULTS* Echocardiogram 2D Echocardiogram has been performed.  Leavy Cella 12/11/2017, 1:17 PM

## 2017-12-11 NOTE — Progress Notes (Signed)
ANTICOAGULATION CONSULT NOTE Pharmacy Consult for Heparin Indication: Atrial fibrillation  No Known Allergies  Patient Measurements: Height: 5\' 11"  (180.3 cm) Weight: 134 lb 4.2 oz (60.9 kg) IBW/kg (Calculated) : 75.3 HEPARIN DW (KG): 61.2   Vital Signs: Temp: 98.4 F (36.9 C) (07/30 0724) Temp Source: Oral (07/30 0724) BP: 115/90 (07/30 0500) Pulse Rate: 79 (07/30 0724)  Labs: Recent Labs    12/10/17 0451 12/10/17 1358 12/11/17 0402  HGB 13.1  --  12.3*  HCT 39.2  --  37.6*  PLT 272  --  242  APTT 32  --   --   LABPROT 12.7  --   --   INR 0.96  --   --   HEPARINUNFRC  --  0.11* 0.11*  CREATININE 1.12  --  0.63  TROPONINI <0.03  --   --    Estimated Creatinine Clearance: 88.8 mL/min (by C-G formula based on SCr of 0.63 mg/dL).  Medical History: History reviewed. No pertinent past medical history.  Medications:   Assessment: 57 yo male presented to the ED with chills, fever and cough.  Code sepsis activated. Pt also tachycardic. Cardiology consult ordered. Pharmacy has been asked to provide IV heparin dosing.  Heparin level below goal.  Goal of Therapy:  Heparin level goal: 0.3-0.7 units/ml Monitor platelets per anticoagulation protocol: Yes   Plan:  Rebolus Heparin 2000 unit bolus Increase Heparin infusion at 1350 units/hr Heparin level in 6-8 hrs with AM labs. Monitor for signs and symptoms of bleeding.   Ramond Craver, [facility] 12/11/2017,8:12 AM

## 2017-12-12 DIAGNOSIS — F10231 Alcohol dependence with withdrawal delirium: Secondary | ICD-10-CM

## 2017-12-12 LAB — COMPREHENSIVE METABOLIC PANEL
ALK PHOS: 115 U/L (ref 38–126)
ALT: 24 U/L (ref 0–44)
AST: 31 U/L (ref 15–41)
Albumin: 3.3 g/dL — ABNORMAL LOW (ref 3.5–5.0)
Anion gap: 7 (ref 5–15)
CALCIUM: 8.4 mg/dL — AB (ref 8.9–10.3)
CO2: 24 mmol/L (ref 22–32)
CREATININE: 0.61 mg/dL (ref 0.61–1.24)
Chloride: 107 mmol/L (ref 98–111)
Glucose, Bld: 110 mg/dL — ABNORMAL HIGH (ref 70–99)
Potassium: 5.3 mmol/L — ABNORMAL HIGH (ref 3.5–5.1)
Sodium: 138 mmol/L (ref 135–145)
Total Bilirubin: 0.7 mg/dL (ref 0.3–1.2)
Total Protein: 6.4 g/dL — ABNORMAL LOW (ref 6.5–8.1)

## 2017-12-12 LAB — CBC
HCT: 36 % — ABNORMAL LOW (ref 39.0–52.0)
Hemoglobin: 11.7 g/dL — ABNORMAL LOW (ref 13.0–17.0)
MCH: 31.5 pg (ref 26.0–34.0)
MCHC: 32.5 g/dL (ref 30.0–36.0)
MCV: 96.8 fL (ref 78.0–100.0)
PLATELETS: 203 10*3/uL (ref 150–400)
RBC: 3.72 MIL/uL — AB (ref 4.22–5.81)
RDW: 15.2 % (ref 11.5–15.5)
WBC: 7.3 10*3/uL (ref 4.0–10.5)

## 2017-12-12 LAB — HEPARIN LEVEL (UNFRACTIONATED): Heparin Unfractionated: 0.44 IU/mL (ref 0.30–0.70)

## 2017-12-12 LAB — MAGNESIUM: MAGNESIUM: 2.4 mg/dL (ref 1.7–2.4)

## 2017-12-12 MED ORDER — METOPROLOL TARTRATE 50 MG PO TABS: 50.0000 mg | ORAL_TABLET | Freq: Four times a day (QID) | ORAL | Status: DC

## 2017-12-12 MED ORDER — APIXABAN 5 MG PO TABS: 5.0000 mg | ORAL_TABLET | Freq: Two times a day (BID) | ORAL | Status: DC

## 2017-12-12 MED ORDER — LORAZEPAM 2 MG/ML IJ SOLN
2.0000 mg | INTRAMUSCULAR | Status: DC | PRN
  Administered 2017-12-12 (×3): 2 mg via INTRAVENOUS
  Filled 2017-12-12: qty 2
  Filled 2017-12-12 (×3): qty 1

## 2017-12-12 MED ORDER — METOPROLOL TARTRATE 5 MG/5ML IV SOLN
5.0000 mg | Freq: Four times a day (QID) | INTRAVENOUS | Status: DC
  Administered 2017-12-12 – 2017-12-13 (×3): 5 mg via INTRAVENOUS
  Filled 2017-12-12 (×3): qty 5

## 2017-12-12 MED ORDER — DEXMEDETOMIDINE HCL IN NACL 200 MCG/50ML IV SOLN
0.2000 ug/kg/h | INTRAVENOUS | Status: DC
  Administered 2017-12-12: 0.7 ug/kg/h via INTRAVENOUS
  Administered 2017-12-12: 0.698 ug/kg/h via INTRAVENOUS
  Administered 2017-12-12: 0.673 ug/kg/h via INTRAVENOUS
  Administered 2017-12-12 – 2017-12-13 (×5): 0.7 ug/kg/h via INTRAVENOUS
  Administered 2017-12-13: 0.6 ug/kg/h via INTRAVENOUS
  Administered 2017-12-13: 0.597 ug/kg/h via INTRAVENOUS
  Administered 2017-12-14 (×2): 0.6 ug/kg/h via INTRAVENOUS
  Administered 2017-12-14: 0.7 ug/kg/h via INTRAVENOUS
  Filled 2017-12-12: qty 100
  Filled 2017-12-12 (×9): qty 50

## 2017-12-12 MED ORDER — ENOXAPARIN SODIUM 80 MG/0.8ML ~~LOC~~ SOLN
65.0000 mg | Freq: Two times a day (BID) | SUBCUTANEOUS | Status: DC
  Administered 2017-12-12 – 2017-12-14 (×4): 65 mg via SUBCUTANEOUS
  Filled 2017-12-12 (×4): qty 0.8

## 2017-12-12 NOTE — Progress Notes (Signed)
ANTICOAGULATION CONSULT NOTE Pharmacy Consult for Heparin Indication: Atrial fibrillation  No Known Allergies  Patient Measurements: Height: 5\' 11"  (180.3 cm) Weight: 140 lb 3.4 oz (63.6 kg) IBW/kg (Calculated) : 75.3 HEPARIN DW (KG): 61.2   Vital Signs: Temp: 96.9 F (36.1 C) (07/31 0700) Temp Source: Axillary (07/31 0700) BP: 141/121 (07/31 0700) Pulse Rate: 131 (07/31 0615)  Labs: Recent Labs    12/10/17 0451  12/11/17 0402 12/11/17 1511 12/12/17 0524  HGB 13.1  --  12.3*  --  11.7*  HCT 39.2  --  37.6*  --  36.0*  PLT 272  --  242  --  203  APTT 32  --   --   --   --   LABPROT 12.7  --   --   --   --   INR 0.96  --   --   --   --   HEPARINUNFRC  --    < > 0.11* 0.52 0.44  CREATININE 1.12  --  0.63  --  0.61  TROPONINI <0.03  --   --   --   --    < > = values in this interval not displayed.   Estimated Creatinine Clearance: 92.8 mL/min (by C-G formula based on SCr of 0.61 mg/dL).  Medical History: History reviewed. No pertinent past medical history.  Medications:   Assessment: 57 yo male presented to the ED with chills, fever and cough.  Code sepsis activated. Pt also tachycardic. Cardiology consult ordered. Pharmacy has been asked to provide IV heparin dosing.  Heparin level therapeutic at 0.44  Goal of Therapy:  Heparin level goal: 0.3-0.7 units/ml Monitor platelets per anticoagulation protocol: Yes   Plan:  Continue Heparin infusion at 1350 units/hr Heparin level daily AM labs. Monitor for signs and symptoms of bleeding.   Ramond Craver, RPH 12/12/2017,7:41 AM

## 2017-12-12 NOTE — Progress Notes (Signed)
Pt daughter at bedside requesting info on status. Another RN had already told her that we couldn't discuss information because she wasn't listed in pt's chart. This nurse gave very brief information. Pt on oxygen, pt sedated because of agitation. In which daughter replied " because of withdrawals" in which this nurse neither confirmed or denied. I explained that pt is stable and that we are keeping him sedated because he needs to rest.

## 2017-12-12 NOTE — Progress Notes (Signed)
ANTICOAGULATION CONSULT NOTE - Initial Consult  Pharmacy Consult for enoxaparin Indication: atrial fibrillation  No Known Allergies  Patient Measurements: Height: 5\' 11"  (180.3 cm) Weight: 140 lb 3.4 oz (63.6 kg) IBW/kg (Calculated) : 75.3 Heparin Dosing Weight: 63.6kg  Vital Signs: Temp: 97.4 F (36.3 C) (07/31 1200) Temp Source: Axillary (07/31 0700) BP: 110/85 (07/31 1800) Pulse Rate: 88 (07/31 1800)  Labs: Recent Labs    12/10/17 0451  12/11/17 0402 12/11/17 1511 12/12/17 0524  HGB 13.1  --  12.3*  --  11.7*  HCT 39.2  --  37.6*  --  36.0*  PLT 272  --  242  --  203  APTT 32  --   --   --   --   LABPROT 12.7  --   --   --   --   INR 0.96  --   --   --   --   HEPARINUNFRC  --    < > 0.11* 0.52 0.44  CREATININE 1.12  --  0.63  --  0.61  TROPONINI <0.03  --   --   --   --    < > = values in this interval not displayed.    Estimated Creatinine Clearance: 92.8 mL/min (by C-G formula based on SCr of 0.61 mg/dL).   Medical History: History reviewed. No pertinent past medical history.  Medications:  Medications Prior to Admission  Medication Sig Dispense Refill Last Dose  . naproxen sodium (ALEVE) 220 MG tablet Take 220-440 mg by mouth daily as needed (for back pain).   Past Week at Unknown time   Scheduled:  . enoxaparin (LOVENOX) injection  65 mg Subcutaneous Q12H  . folic acid  1 mg Oral Daily  . levofloxacin  750 mg Oral Daily  . metoprolol tartrate  50 mg Oral QID  . multivitamin with minerals  1 tablet Oral Daily  . nicotine  21 mg Transdermal Daily  . pantoprazole  40 mg Oral Q0600  . senna  1 tablet Oral BID  . sodium chloride flush  3 mL Intravenous Q12H  . thiamine  100 mg Oral Daily   Or  . thiamine  100 mg Intravenous Daily   Infusions:  . sodium chloride 60 mL/hr at 12/12/17 1439  . dexmedetomidine (PRECEDEX) IV infusion 0.673 mcg/kg/hr (12/12/17 1456)  . diltiazem (CARDIZEM) infusion Stopped (12/12/17 1637)   PRN: acetaminophen **OR**  acetaminophen, ipratropium-albuterol, LORazepam, morphine injection, ondansetron **OR** ondansetron (ZOFRAN) IV, traZODone Anti-infectives (From admission, onward)   Start     Dose/Rate Route Frequency Ordered Stop   12/10/17 1700  vancomycin (VANCOCIN) IVPB 750 mg/150 ml premix  Status:  Discontinued     750 mg 150 mL/hr over 60 Minutes Intravenous Every 12 hours 12/10/17 0914 12/10/17 1024   12/10/17 1200  piperacillin-tazobactam (ZOSYN) IVPB 3.375 g  Status:  Discontinued     3.375 g 12.5 mL/hr over 240 Minutes Intravenous Every 8 hours 12/10/17 0914 12/10/17 1024   12/10/17 1200  levofloxacin (LEVAQUIN) tablet 750 mg     750 mg Oral Daily 12/10/17 1023 12/15/17 0959   12/10/17 0445  piperacillin-tazobactam (ZOSYN) IVPB 3.375 g     3.375 g 100 mL/hr over 30 Minutes Intravenous  Once 12/10/17 0443 12/10/17 0526   12/10/17 0445  vancomycin (VANCOCIN) IVPB 1000 mg/200 mL premix     1,000 mg 200 mL/hr over 60 Minutes Intravenous  Once 12/10/17 0443 12/10/17 0602      Assessment: Patient previously anticoagulated with iv  heparin requiring anticoagulation for afib with enoxaparin.   Goal of Therapy:  anticoagulation with enoxaparin Monitor platelets by anticoagulation protocol: Yes   Plan:  Lovenox 65mg  subq q12h for atrial fibrillation. Will continue to monitor Hgb and CBC   Donna Christen Rande Roylance 12/12/2017,6:30 PM

## 2017-12-12 NOTE — Progress Notes (Signed)
PROGRESS NOTE    BEACHER EVERY  KWI:[id number] DOB: 09-26-1960 DOA: 12/10/2017 PCP: Patient, No Pcp Per    Brief Narrative:  57 year old male admitted to the hospital with fever, cough, feeling generally unwell.  Found to have evidence of pulmonary infection/sepsis.  Also had new onset atrial flutter with rapid ventricular response.  Started on intravenous Cardizem as well as intravenous heparin.  Cardiology following.  Overall heart rates have improved.  Hospital course is complicated by development of alcohol withdrawal with delirium tremens.  Currently on Precedex infusion.   Assessment & Plan:   Principal Problem:   Tachycardia Active Problems:   Syncopal episodes   Abnormal EKG   Hyperglycemia   Sepsis (HCC)   Calcium blood decreased   Atrial fibrillation, transient (HCC)   Smoking   1. Sepsis.  Possibly related to bronchitis.  No evidence of pneumonia on CT imaging.  Received IV antibiotics in the emergency room, these have been transitioned to oral Levaquin.  Blood cultures show no growth.  Hemodynamics have stabilized after receiving IV fluids and lactic acid has trended down to normal.  We will continue to monitor.   2. Atrial flutter with RVR.  Heart rate significantly improved with Cardizem infusion, to the point that it was weaned off.  Since the patient has become agitated, his heart rate has become tachycardic and he is been restarted on Cardizem infusion.  Echocardiogram does not show any acute abnormalities.  Anticoagulation has been transitioned to Eliquis, although in his current mental status, he is refusing to take anything by mouth.  We will change the patient to therapeutic doses of Lovenox until he is more cooperative with oral medications 3. Near syncope.  Patient reported a near syncopal episode prior to admission.  CT head was unremarkable.  Suspect is related to volume depletion and dehydration.  Symptoms have resolved. 4. Acute alcohol withdrawal with delirium  tremens.  Patient reportedly drinks 3 to 4, 40 ounce beers a day.  He is beginning to develop significant alcohol withdrawal.  He did not have any significant response with several doses of intravenous Ativan.  He has been placed on a Precedex infusion with some improvement of his symptoms.  He will need close monitoring and adjustment of medications. 5. Acute bronchitis.  No evidence of pneumonia on CT imaging.  Continue supportive management. 6. Pulmonary nodules.  Repeat CT chest in 3 to 6 months. 7. Tobacco use.  Counseled on the importance of tobacco cessation.   DVT prophylaxis: Lovenox Code Status: full code Family Communication: Discussed with wife at the bedside Disposition Plan: discharge home when improved   Consultants:   cardiology  Procedures:  Echo: - Left ventricle: The cavity size was normal. Wall thickness was   normal. Systolic function was normal. The estimated ejection   fraction was in the range of 50% to 55%. Wall motion was normal;   there were no regional wall motion abnormalities. The study is   not technically sufficient to allow evaluation of LV diastolic   function. - Aortic valve: Mildly calcified annulus. Trileaflet; normal   thickness leaflets. Valve area (VTI): 2.53 cm^2. Valve area   (Vmax): 2.77 cm^2. - Mitral valve: There was mild regurgitation. - Left atrium: The atrium was mildly dilated. - Right ventricle: The cavity size was mildly to moderately   dilated. Systolic function was mildly reduced. - Right atrium: The atrium was moderately dilated. - Tricuspid valve: There was moderate regurgitation. - Pulmonary arteries: Systolic pressure was mildly to  moderately   increased. PA peak pressure: 39 mm Hg (S).  - Technically adequate study.  Antimicrobials:       Subjective: Patient has become confused and agitated overnight.  Currently requiring wrist restraints.  He is been trying to pull out his IV and get out of bed.  Family/staff report  that he has been having visual and auditory hallucinations.  He has been tremulous.  Objective: Vitals:   12/12/17 1400 12/12/17 1608 12/12/17 1700 12/12/17 1800  BP: 101/67 105/73 109/86 110/85  Pulse: 69 68 81 88  Resp: (!) 32  (!) 32 (!) 32  Temp:      TempSrc:      SpO2: 92%     Weight:      Height:        Intake/Output Summary (Last 24 hours) at 12/12/2017 1818 Last data filed at 12/12/2017 1345 Gross per 24 hour  Intake 288.63 ml  Output 1500 ml  Net -1211.37 ml   Filed Weights   12/10/17 1249 12/11/17 0400 12/12/17 0356  Weight: 61.2 kg (134 lb 14.7 oz) 60.9 kg (134 lb 4.2 oz) 63.6 kg (140 lb 3.4 oz)    Examination:  General exam: Awake, sitting up in bed, pulling on restraints Respiratory system: Clear to auscultation. Respiratory effort normal. Cardiovascular system: Irregular. No murmurs, rubs, gallops. Gastrointestinal system: Abdomen is nondistended, soft and nontender. No organomegaly or masses felt. Normal bowel sounds heard. Central nervous system: No focal neurological deficits.  Mildly tremulous Extremities: No C/C/E, +pedal pulses Skin: No rashes, lesions or ulcers Psychiatry: Confused, speech is pressured     Data Reviewed: I have personally reviewed following labs and imaging studies  CBC: Recent Labs  Lab 12/10/17 0451 12/11/17 0402 12/12/17 0524  WBC 8.7 7.4 7.3  NEUTROABS 6.5  --   --   HGB 13.1 12.3* 11.7*  HCT 39.2 37.6* 36.0*  MCV 94.9 94.5 96.8  PLT 272 242 830   Basic Metabolic Panel: Recent Labs  Lab 12/10/17 0451 12/10/17 0923 12/11/17 0402 12/12/17 0524  NA 135  --  138 138  K 3.6  --  3.6 5.3*  CL 97*  --  104 107  CO2 23  --  25 24  GLUCOSE 196*  --  102* 110*  BUN 9  --  <5* <5*  CREATININE 1.12  --  0.63 0.61  CALCIUM 8.8*  --  7.8* 8.4*  MG  --  1.6*  --  2.4   GFR: Estimated Creatinine Clearance: 92.8 mL/min (by C-G formula based on SCr of 0.61 mg/dL). Liver Function Tests: Recent Labs  Lab 12/10/17 0451  12/11/17 0402 12/12/17 0524  AST 46* 24 31  ALT 33 25 24  ALKPHOS 146* 120 115  BILITOT 0.7 0.9 0.7  PROT 7.1 6.3* 6.4*  ALBUMIN 3.7 3.2* 3.3*   No results for input(s): LIPASE, AMYLASE in the last 168 hours. No results for input(s): AMMONIA in the last 168 hours. Coagulation Profile: Recent Labs  Lab 12/10/17 0451  INR 0.96   Cardiac Enzymes: Recent Labs  Lab 12/10/17 0451  TROPONINI <0.03   BNP (last 3 results) No results for input(s): PROBNP in the last 8760 hours. HbA1C: Recent Labs    12/10/17 0451 12/11/17 0402  HGBA1C 5.1 5.1   CBG: No results for input(s): GLUCAP in the last 168 hours. Lipid Profile: No results for input(s): CHOL, HDL, LDLCALC, TRIG, CHOLHDL, LDLDIRECT in the last 72 hours. Thyroid Function Tests: Recent Labs  12/10/17 0923  TSH 2.081  FREET4 0.60*   Anemia Panel: No results for input(s): VITAMINB12, FOLATE, FERRITIN, TIBC, IRON, RETICCTPCT in the last 72 hours. Sepsis Labs: Recent Labs  Lab 12/10/17 0457 12/10/17 0737  LATICACIDVEN 5.22* 1.39    Recent Results (from the past 240 hour(s))  Blood Culture (routine x 2)     Status: None (Preliminary result)   Collection Time: 12/10/17  4:43 AM  Result Value Ref Range Status   Specimen Description BLOOD RIGHT ARM  Final   Special Requests   Final    BOTTLES DRAWN AEROBIC AND ANAEROBIC Blood Culture adequate volume   Culture   Final    NO GROWTH 2 DAYS Performed at [facility], [address], [place], [place] [address]    Report Status PENDING  Incomplete  Blood Culture (routine x 2)     Status: None (Preliminary result)   Collection Time: 12/10/17  4:48 AM  Result Value Ref Range Status   Specimen Description BLOOD LEFT ARM  Final   Special Requests   Final    BOTTLES DRAWN AEROBIC AND ANAEROBIC Blood Culture adequate volume   Culture   Final    NO GROWTH 2 DAYS Performed at [facility], [address] [place], [place] [address]    Report Status PENDING  Incomplete    MRSA PCR Screening     Status: None   Collection Time: 12/10/17  2:30 PM  Result Value Ref Range Status   MRSA by PCR NEGATIVE NEGATIVE Final    Comment:        The GeneXpert MRSA Assay (FDA approved for NASAL specimens only), is one component of a comprehensive MRSA colonization surveillance program. It is not intended to diagnose MRSA infection nor to guide or monitor treatment for MRSA infections. Performed at [facility], [address], [place], [place] [address]          Radiology Studies: No results found.      Scheduled Meds: . folic acid  1 mg Oral Daily  . levofloxacin  750 mg Oral Daily  . metoprolol tartrate  50 mg Oral QID  . multivitamin with minerals  1 tablet Oral Daily  . nicotine  21 mg Transdermal Daily  . pantoprazole  40 mg Oral Q0600  . senna  1 tablet Oral BID  . sodium chloride flush  3 mL Intravenous Q12H  . thiamine  100 mg Oral Daily   Or  . thiamine  100 mg Intravenous Daily   Continuous Infusions: . sodium chloride 60 mL/hr at 12/12/17 1439  . dexmedetomidine (PRECEDEX) IV infusion 0.673 mcg/kg/hr (12/12/17 1456)  . diltiazem (CARDIZEM) infusion Stopped (12/12/17 1637)     LOS: 2 days    Critical care Time spent: 59mins.  Patient is critically ill, requiring intravenous sedation for delirium tremens with intravenous Precedex and needs frequent monitoring.    Kathie Dike, MD [facility] Pager [phone]  If 7PM-7AM, please contact night-coverage [email or web address] Password [email or web address] 12/12/2017, 6:18 PM

## 2017-12-12 NOTE — Progress Notes (Signed)
Progress Note  Patient Name: Nicholas Avila Date of Encounter: 12/12/2017  Primary Cardiologist: Carlyle Dolly, MD   Subjective   Agitated this morning, tremulous.    Inpatient Medications    Scheduled Meds: . folic acid  1 mg Oral Daily  . levofloxacin  750 mg Oral Daily  . metoprolol tartrate  50 mg Oral TID  . multivitamin with minerals  1 tablet Oral Daily  . nicotine  21 mg Transdermal Daily  . pantoprazole  40 mg Oral Q0600  . senna  1 tablet Oral BID  . sodium chloride flush  3 mL Intravenous Q12H  . thiamine  100 mg Oral Daily   Or  . thiamine  100 mg Intravenous Daily   Continuous Infusions: . sodium chloride 60 mL/hr at 12/11/17 2208  . diltiazem (CARDIZEM) infusion 10 mg/hr (12/12/17 0644)  . heparin 1,350 Units/hr (12/11/17 2013)   PRN Meds: acetaminophen **OR** acetaminophen, ipratropium-albuterol, LORazepam, morphine injection, ondansetron **OR** ondansetron (ZOFRAN) IV, traZODone   Vital Signs    Vitals:   12/12/17 0545 12/12/17 0600 12/12/17 0615 12/12/17 0700  BP: 124/90 (!) 113/94 (!) 124/103 (!) 141/121  Pulse:  (!) 127 (!) 131   Resp: 18 (!) 28 (!) 28 (!) 29  Temp:    (!) 96.9 F (36.1 C)  TempSrc:    Axillary  SpO2:  (!) 72% 96%   Weight:      Height:        Intake/Output Summary (Last 24 hours) at 12/12/2017 0804 Last data filed at 12/12/2017 0022 Gross per 24 hour  Intake 3 ml  Output 1500 ml  Net -1497 ml   Filed Weights   12/10/17 1249 12/11/17 0400 12/12/17 0356  Weight: 134 lb 14.7 oz (61.2 kg) 134 lb 4.2 oz (60.9 kg) 140 lb 3.4 oz (63.6 kg)    Telemetry    Aflutter with elevated rates - Personally Reviewed  ECG    na  Physical Exam   GEN: No acute distress.   Neck: No JVD Cardiac: RRR, no murmurs, rubs, or gallops.  Respiratory: Clear to auscultation bilaterally. GI: Soft, nontender, non-distended  MS: No edema; No deformity. Neuro:  Nonfocal  Psych: Normal affect   Labs    Chemistry Recent Labs  Lab  12/10/17 0451 12/11/17 0402 12/12/17 0524  NA 135 138 138  K 3.6 3.6 5.3*  CL 97* 104 107  CO2 23 25 24   GLUCOSE 196* 102* 110*  BUN 9 <5* <5*  CREATININE 1.12 0.63 0.61  CALCIUM 8.8* 7.8* 8.4*  PROT 7.1 6.3* 6.4*  ALBUMIN 3.7 3.2* 3.3*  AST 46* 24 31  ALT 33 25 24  ALKPHOS 146* 120 115  BILITOT 0.7 0.9 0.7  GFRNONAA >60 >60 >60  GFRAA >60 >60 >60  ANIONGAP 15 9 7      Hematology Recent Labs  Lab 12/10/17 0451 12/11/17 0402 12/12/17 0524  WBC 8.7 7.4 7.3  RBC 4.13* 3.98* 3.72*  HGB 13.1 12.3* 11.7*  HCT 39.2 37.6* 36.0*  MCV 94.9 94.5 96.8  MCH 31.7 30.9 31.5  MCHC 33.4 32.7 32.5  RDW 15.1 14.9 15.2  PLT 272 242 203    Cardiac Enzymes Recent Labs  Lab 12/10/17 0451  TROPONINI <0.03   No results for input(s): TROPIPOC in the last 168 hours.   BNPNo results for input(s): BNP, PROBNP in the last 168 hours.   DDimer No results for input(s): DDIMER in the last 168 hours.   Radiology  No results found.  Cardiac Studies    Patient Profile     57 y.o. male admitted with pneumonia/sepsis as well as new onset aflutter with RVR.     Assessment & Plan    1. Aflutter - new diagnosis this admission, unclear if isolated episode in setting of acute illness/infection - rate controlled with IV dilt gtt and IV lopressor initially. Started on heparin gtt - yesterday started lopressor 50mg  tid, Rates actually did quite well yesterday with lower doses of dilt gtt however increase overnight and now back on dilt gtt at 10. Patient agitated this morning, appears to be having some alcohol withdrawal likely driving up his rates.  - based on known history he has a CHADS2Vasc score of 0 (need to f/u HgbA1c, echo). Started on anticoag in the possibility he may require cardioversion at some point. We will d/c heparin, start eliquis 5mg  bid, if not cardioverted this admission would consider as outpatient after 3 weeks of anticoag. HIs rates were well controlled yesterday before  the onset of his agitation.   - keep K at 4, Mg at 2. Replacement per primary team.    2. Bronchitis - per primary team  3. EtOH withdrawal  - per primary team. Family reports he drinks 4 4 oz beers daily.    For questions or updates, please contact [place] Please consult [email or web address] for contact info under Cardiology/STEMI.      Merrily Pew, MD  12/12/2017, 8:04 AM

## 2017-12-12 NOTE — Clinical Social Work Note (Signed)
Pt is a 57 year old male referred to CSW for ETOH treatment resources. Pt currently in withdrawal and on Precidex. Will follow up with pt when he is more stable.

## 2017-12-12 NOTE — Progress Notes (Signed)
Cardizem gtt started d/t HR sustaining 130's after IV metoprolol given. Rate @ 38mcg/hr, will titrate per protocol. Will continue to monitor pt

## 2017-12-12 NOTE — Progress Notes (Signed)
Dr. Kennon Holter paged to see if Metoprolol po could be changed to IV d/t pt being on precedex- not alert enough to take po. HR right now 130. Waiting for call back- will continue to monitor pt

## 2017-12-13 ENCOUNTER — Encounter (HOSPITAL_COMMUNITY): Payer: Self-pay

## 2017-12-13 DIAGNOSIS — F10939 Alcohol use, unspecified with withdrawal, unspecified: Secondary | ICD-10-CM | POA: Diagnosis not present

## 2017-12-13 DIAGNOSIS — F10931 Alcohol use, unspecified with withdrawal delirium: Secondary | ICD-10-CM | POA: Diagnosis not present

## 2017-12-13 DIAGNOSIS — F10231 Alcohol dependence with withdrawal delirium: Secondary | ICD-10-CM | POA: Diagnosis not present

## 2017-12-13 DIAGNOSIS — F10239 Alcohol dependence with withdrawal, unspecified: Secondary | ICD-10-CM | POA: Diagnosis not present

## 2017-12-13 LAB — COMPREHENSIVE METABOLIC PANEL
ALK PHOS: 117 U/L (ref 38–126)
ALT: 24 U/L (ref 0–44)
AST: 25 U/L (ref 15–41)
Albumin: 3.2 g/dL — ABNORMAL LOW (ref 3.5–5.0)
Anion gap: 7 (ref 5–15)
CALCIUM: 8.7 mg/dL — AB (ref 8.9–10.3)
CO2: 23 mmol/L (ref 22–32)
CREATININE: 0.62 mg/dL (ref 0.61–1.24)
Chloride: 109 mmol/L (ref 98–111)
Glucose, Bld: 106 mg/dL — ABNORMAL HIGH (ref 70–99)
Potassium: 4.2 mmol/L (ref 3.5–5.1)
Sodium: 139 mmol/L (ref 135–145)
Total Bilirubin: 1 mg/dL (ref 0.3–1.2)
Total Protein: 6.4 g/dL — ABNORMAL LOW (ref 6.5–8.1)

## 2017-12-13 LAB — CBC
HCT: 37.3 % — ABNORMAL LOW (ref 39.0–52.0)
Hemoglobin: 12.2 g/dL — ABNORMAL LOW (ref 13.0–17.0)
MCH: 31.4 pg (ref 26.0–34.0)
MCHC: 32.7 g/dL (ref 30.0–36.0)
MCV: 96.1 fL (ref 78.0–100.0)
PLATELETS: 197 10*3/uL (ref 150–400)
RBC: 3.88 MIL/uL — AB (ref 4.22–5.81)
RDW: 14.8 % (ref 11.5–15.5)
WBC: 9.4 10*3/uL (ref 4.0–10.5)

## 2017-12-13 LAB — HEPARIN LEVEL (UNFRACTIONATED): Heparin Unfractionated: <0.1 IU/mL — ABNORMAL LOW (ref 0.30–0.70)

## 2017-12-13 MED ORDER — METOPROLOL TARTRATE 5 MG/5ML IV SOLN
5.0000 mg | Freq: Once | INTRAVENOUS | Status: AC
  Administered 2017-12-13: 5 mg via INTRAVENOUS
  Filled 2017-12-13: qty 5

## 2017-12-13 MED ORDER — METOPROLOL TARTRATE 50 MG PO TABS
50.0000 mg | ORAL_TABLET | Freq: Three times a day (TID) | ORAL | Status: DC
  Administered 2017-12-13 (×3): 50 mg via ORAL
  Filled 2017-12-13 (×4): qty 1

## 2017-12-13 NOTE — Progress Notes (Signed)
Progress Note  Patient Name: Nicholas Avila Date of Encounter: 12/13/2017  Primary Cardiologist: Carlyle Dolly, MD   Subjective  No complaints  Inpatient Medications    Scheduled Meds: . enoxaparin (LOVENOX) injection  65 mg Subcutaneous Q12H  . folic acid  1 mg Oral Daily  . levofloxacin  750 mg Oral Daily  . metoprolol tartrate  5 mg Intravenous Q6H  . multivitamin with minerals  1 tablet Oral Daily  . nicotine  21 mg Transdermal Daily  . pantoprazole  40 mg Oral Q0600  . senna  1 tablet Oral BID  . sodium chloride flush  3 mL Intravenous Q12H  . thiamine  100 mg Oral Daily   Or  . thiamine  100 mg Intravenous Daily   Continuous Infusions: . sodium chloride 60 mL/hr at 12/13/17 0610  . dexmedetomidine (PRECEDEX) IV infusion 0.7 mcg/kg/hr (12/13/17 0446)  . diltiazem (CARDIZEM) infusion 5 mg/hr (12/13/17 0610)   PRN Meds: acetaminophen **OR** acetaminophen, ipratropium-albuterol, LORazepam, morphine injection, ondansetron **OR** ondansetron (ZOFRAN) IV, traZODone   Vital Signs    Vitals:   12/13/17 0615 12/13/17 0630 12/13/17 0645 12/13/17 0700  BP: 128/89 (!) 131/91 (!) 133/92 132/87  Pulse: 93 94 (!) 103 (!) 103  Resp: (!) 24 19 (!) 28 (!) 29  Temp:      TempSrc:      SpO2: 97% 98% 96%   Weight:      Height:        Intake/Output Summary (Last 24 hours) at 12/13/2017 0809 Last data filed at 12/13/2017 0304 Gross per 24 hour  Intake 3774.98 ml  Output 1800 ml  Net 1974.98 ml   Filed Weights   12/10/17 1249 12/11/17 0400 12/12/17 0356  Weight: 134 lb 14.7 oz (61.2 kg) 134 lb 4.2 oz (60.9 kg) 140 lb 3.4 oz (63.6 kg)    Telemetry    Aflutter 100 - Personally Reviewed  ECG    na  Physical Exam   GEN: No acute distress.   Neck: No JVD Cardiac: irreg, no murmurs, rubs, or gallops.  Respiratory: Clear to auscultation bilaterally. GI: Soft, nontender, non-distended  MS: No edema; No deformity. Neuro:  Nonfocal  Psych: Normal affect   Labs      Chemistry Recent Labs  Lab 12/11/17 0402 12/12/17 0524 12/13/17 0500  NA 138 138 139  K 3.6 5.3* 4.2  CL 104 107 109  CO2 25 24 23   GLUCOSE 102* 110* 106*  BUN <5* <5* <5*  CREATININE 0.63 0.61 0.62  CALCIUM 7.8* 8.4* 8.7*  PROT 6.3* 6.4* 6.4*  ALBUMIN 3.2* 3.3* 3.2*  AST 24 31 25   ALT 25 24 24   ALKPHOS 120 115 117  BILITOT 0.9 0.7 1.0  GFRNONAA >60 >60 >60  GFRAA >60 >60 >60  ANIONGAP 9 7 7      Hematology Recent Labs  Lab 12/11/17 0402 12/12/17 0524 12/13/17 0500  WBC 7.4 7.3 9.4  RBC 3.98* 3.72* 3.88*  HGB 12.3* 11.7* 12.2*  HCT 37.6* 36.0* 37.3*  MCV 94.5 96.8 96.1  MCH 30.9 31.5 31.4  MCHC 32.7 32.5 32.7  RDW 14.9 15.2 14.8  PLT 242 203 197    Cardiac Enzymes Recent Labs  Lab 12/10/17 0451  TROPONINI <0.03   No results for input(s): TROPIPOC in the last 168 hours.   BNPNo results for input(s): BNP, PROBNP in the last 168 hours.   DDimer No results for input(s): DDIMER in the last 168 hours.   Radiology  No results found.  Cardiac Studies    Patient Profile     57 y.o. male admitted with pneumonia/sepsis as well as new onset aflutter with RVR.     Assessment & Plan    1. Aflutter - new diagnosis this admission, unclear if isolated episode in setting of acute illness/infection - rate controlled with IV dilt gtt and oral lopressor. Rates had improved initially, but wilth onset of EtOH withdrawal recurrent tachycardia - dilt currently on 5. Not taking oral, restarted on IV lopressor 5mg  every 6 hours - CHADS2Vasc score of 0. Started on anticoag in case cardioversion is needed. Would need 3 weeks of anticoag prior to cardioversion and 1 month after.   - plan would be 7 weeks total of anticoag, then can d/c.  - resume eliquis 5mg  bid and lopressor 50mg  qid once able to restart oral pills, currently NPO due to EtOH withdrawal  2. Bronchitis - per primary team  3. EtOH withdrawal  - per primary team. Severe, refractory to ativan  Started on precedex drip.       For questions or updates, please contact [place] Please consult [email or web address] for contact info under Cardiology/STEMI.      Merrily Pew, MD  12/13/2017, 8:09 AM

## 2017-12-13 NOTE — Progress Notes (Signed)
PROGRESS NOTE    SAILOR HAUGHN  LDJ:[id number] DOB: 10-21-1960 DOA: 12/10/2017 PCP: Patient, No Pcp Per    Brief Narrative:  57 year old male admitted to the hospital with fever, cough, feeling generally unwell.  Found to have evidence of pulmonary infection/sepsis.  Also had new onset atrial flutter with rapid ventricular response.  Started on intravenous Cardizem as well as intravenous heparin.  Cardiology following.  Overall heart rates have improved.  Hospital course is complicated by development of alcohol withdrawal with delirium tremens.  Currently on Precedex infusion.   Assessment & Plan:   Principal Problem:   Tachycardia Active Problems:   Syncopal episodes   Abnormal EKG   Hyperglycemia   Sepsis (HCC)   Calcium blood decreased   Atrial fibrillation, transient (HCC)   Smoking   1. Sepsis.  Possibly related to bronchitis.  No evidence of pneumonia on CT imaging.  Received IV antibiotics in the emergency room, these have been transitioned to oral Levaquin.  Blood cultures show no growth.  Hemodynamics have stabilized after receiving IV fluids and lactic acid has trended down to normal.  We will continue to monitor.   2. Atrial flutter with RVR.  Heart rate is being controlled with oral metoprolol.  Intermittently he does require intravenous Cardizem when he becomes agitated..  Echocardiogram does not show any acute abnormalities.  Anticoagulation has been transitioned to Eliquis 3. Near syncope.  Patient reported a near syncopal episode prior to admission.  CT head was unremarkable.  Suspect is related to volume depletion and dehydration.  Symptoms have resolved. 4. Acute alcohol withdrawal with delirium tremens.  Patient reportedly drinks 3 to 4, 40 ounce beers a day.  He developed significant alcohol withdrawal.  He was initially treated with intravenous Ativan, but did not have any significant response.  He was subsequently transitioned to Precedex with improvement of his  symptoms.  Currently, he still requiring Precedex, since he becomes agitated once this is started to wean down.  He will need close monitoring and adjustment of medications. 5. Acute bronchitis.  No evidence of pneumonia on CT imaging.  Continue supportive management. 6. Pulmonary nodules.  Repeat CT chest in 3 to 6 months. 7. Tobacco use.  Counseled on the importance of tobacco cessation.   DVT prophylaxis: Lovenox Code Status: full code Family Communication: No family at bedside Disposition Plan: discharge home when improved   Consultants:   cardiology  Procedures:  Echo: - Left ventricle: The cavity size was normal. Wall thickness was   normal. Systolic function was normal. The estimated ejection   fraction was in the range of 50% to 55%. Wall motion was normal;   there were no regional wall motion abnormalities. The study is   not technically sufficient to allow evaluation of LV diastolic   function. - Aortic valve: Mildly calcified annulus. Trileaflet; normal   thickness leaflets. Valve area (VTI): 2.53 cm^2. Valve area   (Vmax): 2.77 cm^2. - Mitral valve: There was mild regurgitation. - Left atrium: The atrium was mildly dilated. - Right ventricle: The cavity size was mildly to moderately   dilated. Systolic function was mildly reduced. - Right atrium: The atrium was moderately dilated. - Tricuspid valve: There was moderate regurgitation. - Pulmonary arteries: Systolic pressure was mildly to moderately   increased. PA peak pressure: 39 mm Hg (S).  - Technically adequate study.  Antimicrobials:       Subjective: Continues to become agitated once Precedex is titrated down.  Objective: Vitals:   12/13/17  1300 12/13/17 1400 12/13/17 1500 12/13/17 1600  BP: 124/90 114/82 113/74 123/88  Pulse: (!) 112 94 (!) 106 (!) 116  Resp: (!) 27 (!) 23 (!) 24 (!) 25  Temp: 98.3 F (36.8 C)     TempSrc: Oral     SpO2:  98% 100% 100%  Weight:      Height:         Intake/Output Summary (Last 24 hours) at 12/13/2017 1720 Last data filed at 12/13/2017 1200 Gross per 24 hour  Intake 3492.35 ml  Output 3950 ml  Net -457.65 ml   Filed Weights   12/10/17 1249 12/11/17 0400 12/12/17 0356  Weight: 61.2 kg (134 lb 14.7 oz) 60.9 kg (134 lb 4.2 oz) 63.6 kg (140 lb 3.4 oz)    Examination:  General exam: Somnolent Respiratory system: Clear to auscultation. Respiratory effort normal. Cardiovascular system:RRR. No murmurs, rubs, gallops. Gastrointestinal system: Abdomen is nondistended, soft and nontender. No organomegaly or masses felt. Normal bowel sounds heard. Central nervous system:  No focal neurological deficits. Extremities: No C/C/E, +pedal pulses Skin: No rashes, lesions or ulcers Psychiatry: Lethargic, sedated      Data Reviewed: I have personally reviewed following labs and imaging studies  CBC: Recent Labs  Lab 12/10/17 0451 12/11/17 0402 12/12/17 0524 12/13/17 0500  WBC 8.7 7.4 7.3 9.4  NEUTROABS 6.5  --   --   --   HGB 13.1 12.3* 11.7* 12.2*  HCT 39.2 37.6* 36.0* 37.3*  MCV 94.9 94.5 96.8 96.1  PLT 272 242 203 850   Basic Metabolic Panel: Recent Labs  Lab 12/10/17 0451 12/10/17 0923 12/11/17 0402 12/12/17 0524 12/13/17 0500  NA 135  --  138 138 139  K 3.6  --  3.6 5.3* 4.2  CL 97*  --  104 107 109  CO2 23  --  25 24 23   GLUCOSE 196*  --  102* 110* 106*  BUN 9  --  <5* <5* <5*  CREATININE 1.12  --  0.63 0.61 0.62  CALCIUM 8.8*  --  7.8* 8.4* 8.7*  MG  --  1.6*  --  2.4  --    GFR: Estimated Creatinine Clearance: 92.8 mL/min (by C-G formula based on SCr of 0.62 mg/dL). Liver Function Tests: Recent Labs  Lab 12/10/17 0451 12/11/17 0402 12/12/17 0524 12/13/17 0500  AST 46* 24 31 25   ALT 33 25 24 24   ALKPHOS 146* 120 115 117  BILITOT 0.7 0.9 0.7 1.0  PROT 7.1 6.3* 6.4* 6.4*  ALBUMIN 3.7 3.2* 3.3* 3.2*   No results for input(s): LIPASE, AMYLASE in the last 168 hours. No results for input(s): AMMONIA in  the last 168 hours. Coagulation Profile: Recent Labs  Lab 12/10/17 0451  INR 0.96   Cardiac Enzymes: Recent Labs  Lab 12/10/17 0451  TROPONINI <0.03   BNP (last 3 results) No results for input(s): PROBNP in the last 8760 hours. HbA1C: Recent Labs    12/11/17 0402  HGBA1C 5.1   CBG: No results for input(s): GLUCAP in the last 168 hours. Lipid Profile: No results for input(s): CHOL, HDL, LDLCALC, TRIG, CHOLHDL, LDLDIRECT in the last 72 hours. Thyroid Function Tests: No results for input(s): TSH, T4TOTAL, FREET4, T3FREE, THYROIDAB in the last 72 hours. Anemia Panel: No results for input(s): VITAMINB12, FOLATE, FERRITIN, TIBC, IRON, RETICCTPCT in the last 72 hours. Sepsis Labs: Recent Labs  Lab 12/10/17 0457 12/10/17 0737  LATICACIDVEN 5.22* 1.39    Recent Results (from the past 240 hour(s))  Blood Culture (routine x 2)     Status: None (Preliminary result)   Collection Time: 12/10/17  4:43 AM  Result Value Ref Range Status   Specimen Description BLOOD RIGHT ARM  Final   Special Requests   Final    BOTTLES DRAWN AEROBIC AND ANAEROBIC Blood Culture adequate volume   Culture   Final    NO GROWTH 3 DAYS Performed at [facility], [address], [place], [place] [address]    Report Status PENDING  Incomplete  Blood Culture (routine x 2)     Status: None (Preliminary result)   Collection Time: 12/10/17  4:48 AM  Result Value Ref Range Status   Specimen Description BLOOD LEFT ARM  Final   Special Requests   Final    BOTTLES DRAWN AEROBIC AND ANAEROBIC Blood Culture adequate volume   Culture   Final    NO GROWTH 3 DAYS Performed at [facility], [address], [place], [place] [address]    Report Status PENDING  Incomplete  MRSA PCR Screening     Status: None   Collection Time: 12/10/17  2:30 PM  Result Value Ref Range Status   MRSA by PCR NEGATIVE NEGATIVE Final    Comment:        The GeneXpert MRSA Assay (FDA approved for NASAL specimens only), is one  component of a comprehensive MRSA colonization surveillance program. It is not intended to diagnose MRSA infection nor to guide or monitor treatment for MRSA infections. Performed at [facility], [address], [place], [place] [address]          Radiology Studies: No results found.      Scheduled Meds: . enoxaparin (LOVENOX) injection  65 mg Subcutaneous Q12H  . folic acid  1 mg Oral Daily  . levofloxacin  750 mg Oral Daily  . metoprolol tartrate  50 mg Oral TID  . multivitamin with minerals  1 tablet Oral Daily  . nicotine  21 mg Transdermal Daily  . pantoprazole  40 mg Oral Q0600  . senna  1 tablet Oral BID  . sodium chloride flush  3 mL Intravenous Q12H  . thiamine  100 mg Oral Daily   Or  . thiamine  100 mg Intravenous Daily   Continuous Infusions: . sodium chloride 60 mL/hr at 12/13/17 0610  . dexmedetomidine (PRECEDEX) IV infusion 0.6 mcg/kg/hr (12/13/17 1452)     LOS: 3 days    Critical care Time spent: 40mins.  Patient remains critically ill, requiring intravenous sedation for delirium tremens with intravenous Precedex and needs frequent monitoring    Kathie Dike, MD [facility] Pager [phone]  If 7PM-7AM, please contact night-coverage [email or web address] Password [email or web address] 12/13/2017, 5:20 PM

## 2017-12-13 NOTE — Progress Notes (Signed)
Pt HR remains 140 at this time. Pt has already had bedtime dose of metoprolol 50mg  PO. Midlevel provider notified at this time via text page of sustained HR.

## 2017-12-13 NOTE — Progress Notes (Signed)
Pt having multi bigeminy pvcs. Decreased Precedex to 0.104mcg and discontinued Cardizem drip per Dr. Blythe Stanford orders. Pt is otherwise asymptomatic and tolerating meds well.

## 2017-12-13 NOTE — Care Management Note (Signed)
Case Management Note  Patient Details  Name: Nicholas Avila MRN: [id number] Date of Birth: 04-Oct-1960  Subjective/Objective:  New Atrial Flutter. Will transition to Eliquis prior to cardioversion. Then will need another month of Eliquis. Patient does not have insurance. Works full time. Does not have a PCP. Currently on precedex for withdrawals.                 Action/Plan: Prior to DC will provide patient with MATCH voucher to cover first month of Eliquis. Will also provide Eliquis 30 day coupon for second month.      Expected Discharge Date:  12/12/17               Expected Discharge Plan:  Home/Self Care  In-House Referral:     Discharge planning Services  CM Consult, Medication Assistance, [place] Program  Post Acute Care Choice:  NA Choice offered to:  NA  DME Arranged:    DME Agency:     HH Arranged:    HH Agency:     Status of Service:  In process, will continue to follow  If discussed at Long Length of Stay Meetings, dates discussed:    Additional Comments:  Chyann Ambrocio, Chauncey Reading, RN 12/13/2017, 10:47 AM

## 2017-12-13 NOTE — Progress Notes (Signed)
RN tried slowly weaning pt off precedex gtt- when pt is alert his RR 20's when he is asleep his RR 30-40s. Precedex gtt decreased to 0.6, pt woke up and tried to get OOB- being very agitated and verbally abusive. RN educated pt on reasoning for medication as well as restraints. Precedex gtt increased back to 0.7. Will continue to monitor pt.

## 2017-12-14 ENCOUNTER — Encounter (HOSPITAL_COMMUNITY): Payer: Self-pay

## 2017-12-14 LAB — COMPREHENSIVE METABOLIC PANEL WITH GFR
ALT: 17 U/L (ref 0–44)
AST: 14 U/L — ABNORMAL LOW (ref 15–41)
Albumin: 2.8 g/dL — ABNORMAL LOW (ref 3.5–5.0)
Alkaline Phosphatase: 102 U/L (ref 38–126)
Anion gap: 7 (ref 5–15)
BUN: 5 mg/dL — ABNORMAL LOW (ref 6–20)
CO2: 23 mmol/L (ref 22–32)
Calcium: 8.5 mg/dL — ABNORMAL LOW (ref 8.9–10.3)
Chloride: 108 mmol/L (ref 98–111)
Creatinine, Ser: 0.66 mg/dL (ref 0.61–1.24)
GFR calc Af Amer: >60 mL/min
GFR calc non Af Amer: >60 mL/min
Glucose, Bld: 122 mg/dL — ABNORMAL HIGH (ref 70–99)
Potassium: 3.6 mmol/L (ref 3.5–5.1)
Sodium: 138 mmol/L (ref 135–145)
Total Bilirubin: 0.8 mg/dL (ref 0.3–1.2)
Total Protein: 6 g/dL — ABNORMAL LOW (ref 6.5–8.1)

## 2017-12-14 LAB — CBC
HCT: 36.6 % — ABNORMAL LOW (ref 39.0–52.0)
Hemoglobin: 11.8 g/dL — ABNORMAL LOW (ref 13.0–17.0)
MCH: 30.8 pg (ref 26.0–34.0)
MCHC: 32.2 g/dL (ref 30.0–36.0)
MCV: 95.6 fL (ref 78.0–100.0)
Platelets: 203 10*3/uL (ref 150–400)
RBC: 3.83 MIL/uL — ABNORMAL LOW (ref 4.22–5.81)
RDW: 14.6 % (ref 11.5–15.5)
WBC: 6.2 10*3/uL (ref 4.0–10.5)

## 2017-12-14 MED ORDER — APIXABAN 5 MG PO TABS
5.0000 mg | ORAL_TABLET | Freq: Two times a day (BID) | ORAL | Status: DC
  Administered 2017-12-14 – 2017-12-17 (×6): 5 mg via ORAL
  Filled 2017-12-14 (×6): qty 1

## 2017-12-14 MED ORDER — DILTIAZEM HCL-DEXTROSE 100-5 MG/100ML-% IV SOLN (PREMIX)
5.0000 mg/h | INTRAVENOUS | Status: DC
  Administered 2017-12-14 – 2017-12-17 (×4): 5 mg/h via INTRAVENOUS
  Filled 2017-12-14 (×3): qty 100

## 2017-12-14 MED ORDER — METOPROLOL TARTRATE 50 MG PO TABS
50.0000 mg | ORAL_TABLET | Freq: Four times a day (QID) | ORAL | Status: DC
  Administered 2017-12-14 – 2017-12-15 (×8): 50 mg via ORAL
  Filled 2017-12-14 (×7): qty 1

## 2017-12-14 NOTE — Progress Notes (Signed)
Pt HR back up to 140s after metoprolol push. Cardizem Drip reordered.

## 2017-12-14 NOTE — Progress Notes (Signed)
Progress Note  Patient Name: Nicholas Avila Date of Encounter: 12/14/2017  Primary Cardiologist: Carlyle Dolly, MD   Subjective   No complaints Inpatient Medications    Scheduled Meds: . enoxaparin (LOVENOX) injection  65 mg Subcutaneous Q12H  . folic acid  1 mg Oral Daily  . levofloxacin  750 mg Oral Daily  . metoprolol tartrate  50 mg Oral TID  . multivitamin with minerals  1 tablet Oral Daily  . nicotine  21 mg Transdermal Daily  . pantoprazole  40 mg Oral Q0600  . senna  1 tablet Oral BID  . sodium chloride flush  3 mL Intravenous Q12H  . thiamine  100 mg Oral Daily   Or  . thiamine  100 mg Intravenous Daily   Continuous Infusions: . sodium chloride 60 mL/hr at 12/13/17 2009  . dexmedetomidine (PRECEDEX) IV infusion 0.6 mcg/kg/hr (12/14/17 0450)  . diltiazem (CARDIZEM) infusion 5 mg/hr (12/14/17 0116)   PRN Meds: acetaminophen **OR** acetaminophen, ipratropium-albuterol, LORazepam, morphine injection, ondansetron **OR** ondansetron (ZOFRAN) IV, traZODone   Vital Signs    Vitals:   12/14/17 0600 12/14/17 0615 12/14/17 0630 12/14/17 0645  BP: 132/86 (!) 131/99 140/90 (!) 137/92  Pulse: 95 94 94 89  Resp: 19 19 18 18   Temp:      TempSrc:      SpO2: 100% 100% 100% 100%  Weight:      Height:        Intake/Output Summary (Last 24 hours) at 12/14/2017 0850 Last data filed at 12/14/2017 0600 Gross per 24 hour  Intake 1368.68 ml  Output 6200 ml  Net -4831.32 ml   Filed Weights   12/11/17 0400 12/12/17 0356 12/14/17 0419  Weight: 134 lb 4.2 oz (60.9 kg) 140 lb 3.4 oz (63.6 kg) 145 lb 4.5 oz (65.9 kg)    Telemetry    afin variable rates - Personally Reviewed  ECG    na  Physical Exam   GEN: No acute distress.   Neck: No JVD Cardiac: irreg, no murmurs, rubs, or gallops.  Respiratory: Clear to auscultation bilaterally. GI: Soft, nontender, non-distended  MS: No edema; No deformity. Neuro:  Nonfocal  Psych: Normal affect   Labs     Chemistry Recent Labs  Lab 12/12/17 0524 12/13/17 0500 12/14/17 0403  NA 138 139 138  K 5.3* 4.2 3.6  CL 107 109 108  CO2 24 23 23   GLUCOSE 110* 106* 122*  BUN <5* <5* 5*  CREATININE 0.61 0.62 0.66  CALCIUM 8.4* 8.7* 8.5*  PROT 6.4* 6.4* 6.0*  ALBUMIN 3.3* 3.2* 2.8*  AST 31 25 14*  ALT 24 24 17   ALKPHOS 115 117 102  BILITOT 0.7 1.0 0.8  GFRNONAA >60 >60 >60  GFRAA >60 >60 >60  ANIONGAP 7 7 7      Hematology Recent Labs  Lab 12/12/17 0524 12/13/17 0500 12/14/17 0403  WBC 7.3 9.4 6.2  RBC 3.72* 3.88* 3.83*  HGB 11.7* 12.2* 11.8*  HCT 36.0* 37.3* 36.6*  MCV 96.8 96.1 95.6  MCH 31.5 31.4 30.8  MCHC 32.5 32.7 32.2  RDW 15.2 14.8 14.6  PLT 203 197 203    Cardiac Enzymes Recent Labs  Lab 12/10/17 0451  TROPONINI <0.03   No results for input(s): TROPIPOC in the last 168 hours.   BNPNo results for input(s): BNP, PROBNP in the last 168 hours.   DDimer No results for input(s): DDIMER in the last 168 hours.   Radiology    No results found.  Cardiac Studies    Patient Profile     57 y.o. male admitted with pneumonia/sepsis as well as new onset aflutter with RVR.    Assessment & Plan    1. Aflutter - new diagnosis this admission, unclear if isolated episode in setting of acute illness/infection - rate control complicated by EtOH withdrawal - on dilt gtt, oral lopressor 50mg  tid. On lonvenox for anticoag, will need to change to oral eliquis prior to discharge.  - CHADS2Vasc score of 0, anticoag at this time for possible cardioversion within the next few weeks.   - increase lopressor to 50mg  qid. Wean dilt drip for heart rates <110. With ongoing alchohol withdrawal rates will continue to be labile. Remains on precedex. Can vitals stable can consolidation to Toprol XL 100mg  bid tomorrow.   2.Bronchitis - per primary team  3. EtOH withdrawal  - per primary team. Severe, refractory to ativan Started on precedex drip with improved symptoms.       For questions or updates, please contact [place] Please consult [email or web address] for contact info under Cardiology/STEMI.      Merrily Pew, MD  12/14/2017, 8:50 AM

## 2017-12-14 NOTE — Progress Notes (Signed)
PROGRESS NOTE    Nicholas Avila  DJM:[id number] DOB: 07-23-1960 DOA: 12/10/2017 PCP: Patient, No Pcp Per    Brief Narrative:  57 year old male admitted to the hospital with fever, cough, feeling generally unwell.  Found to have evidence of pulmonary infection/sepsis.  Also had new onset atrial flutter with rapid ventricular response.  Started on intravenous Cardizem as well as intravenous heparin.  Cardiology following.  Overall heart rates have improved.  Hospital course is complicated by development of alcohol withdrawal with delirium tremens.  Currently on Precedex infusion.   Assessment & Plan:   Principal Problem:   Tachycardia Active Problems:   Syncopal episodes   Abnormal EKG   Hyperglycemia   Sepsis (HCC)   Calcium blood decreased   Atrial fibrillation, transient (HCC)   Smoking   Alcohol withdrawal syndrome with complication, with unspecified complication ([place])   Delirium tremens ([place])   1. Sepsis.  Possibly related to bronchitis.  No evidence of pneumonia on CT imaging.  Received IV antibiotics in the emergency room, these have been transitioned to oral Levaquin.  Blood cultures show no growth.  Hemodynamics have stabilized after receiving IV fluids and lactic acid has trended down to normal.  We will continue to monitor.   2. Atrial flutter with RVR.  Heart rate is being controlled with oral metoprolol.  Intermittently he does require intravenous Cardizem when he becomes agitated..  Echocardiogram does not show any acute abnormalities.  Anticoagulation has been transitioned to Eliquis 3. Near syncope.  Patient reported a near syncopal episode prior to admission.  CT head was unremarkable.  Suspect is related to volume depletion and dehydration.  Symptoms have resolved. 4. Acute alcohol withdrawal with delirium tremens.  Patient reportedly drinks 3 to 4, 40 ounce beers a day.  He developed significant alcohol withdrawal.  He was initially treated with intravenous Ativan, but  did not have any significant response.  He was subsequently transitioned to Precedex with improvement of his symptoms.  Overall, he has improved.  Still on Precedex, but this is being weaned off. 5. Acute bronchitis.  No evidence of pneumonia on CT imaging.  Continue supportive management.  Respiratory status is stable 6. Pulmonary nodules.  Repeat CT chest in 3 to 6 months. 7. Tobacco use.  Counseled on the importance of tobacco cessation.   DVT prophylaxis: Lovenox Code Status: full code Family Communication: No family at bedside Disposition Plan: discharge home when improved   Consultants:   cardiology  Procedures:  Echo: - Left ventricle: The cavity size was normal. Wall thickness was   normal. Systolic function was normal. The estimated ejection   fraction was in the range of 50% to 55%. Wall motion was normal;   there were no regional wall motion abnormalities. The study is   not technically sufficient to allow evaluation of LV diastolic   function. - Aortic valve: Mildly calcified annulus. Trileaflet; normal   thickness leaflets. Valve area (VTI): 2.53 cm^2. Valve area   (Vmax): 2.77 cm^2. - Mitral valve: There was mild regurgitation. - Left atrium: The atrium was mildly dilated. - Right ventricle: The cavity size was mildly to moderately   dilated. Systolic function was mildly reduced. - Right atrium: The atrium was moderately dilated. - Tricuspid valve: There was moderate regurgitation. - Pulmonary arteries: Systolic pressure was mildly to moderately   increased. PA peak pressure: 39 mm Hg (S).  - Technically adequate study.  Antimicrobials:       Subjective: Still on Precedex and Cardizem infusion.  Appears less agitated today.  Objective: Vitals:   12/14/17 0700 12/14/17 0800 12/14/17 0900 12/14/17 1000  BP: 130/89 119/90 112/77   Pulse: 95 95 96 (!) 101  Resp: 19 20 (!) 21 15  Temp:      TempSrc:      SpO2:      Weight:      Height:         Intake/Output Summary (Last 24 hours) at 12/14/2017 1657 Last data filed at 12/14/2017 1509 Gross per 24 hour  Intake 2644.36 ml  Output 4050 ml  Net -1405.64 ml   Filed Weights   12/11/17 0400 12/12/17 0356 12/14/17 0419  Weight: 60.9 kg (134 lb 4.2 oz) 63.6 kg (140 lb 3.4 oz) 65.9 kg (145 lb 4.5 oz)    Examination:  General exam: Awake, alert, no distress Respiratory system: Clear to auscultation. Respiratory effort normal. Cardiovascular system:RRR. No murmurs, rubs, gallops. Gastrointestinal system: Abdomen is nondistended, soft and nontender. No organomegaly or masses felt. Normal bowel sounds heard. Central nervous system:  No focal neurological deficits. Extremities: No C/C/E, +pedal pulses Skin: No rashes, lesions or ulcers Psychiatry: Patient is pleasant, interactive      Data Reviewed: I have personally reviewed following labs and imaging studies  CBC: Recent Labs  Lab 12/10/17 0451 12/11/17 0402 12/12/17 0524 12/13/17 0500 12/14/17 0403  WBC 8.7 7.4 7.3 9.4 6.2  NEUTROABS 6.5  --   --   --   --   HGB 13.1 12.3* 11.7* 12.2* 11.8*  HCT 39.2 37.6* 36.0* 37.3* 36.6*  MCV 94.9 94.5 96.8 96.1 95.6  PLT 272 242 203 197 454   Basic Metabolic Panel: Recent Labs  Lab 12/10/17 0451 12/10/17 0923 12/11/17 0402 12/12/17 0524 12/13/17 0500 12/14/17 0403  NA 135  --  138 138 139 138  K 3.6  --  3.6 5.3* 4.2 3.6  CL 97*  --  104 107 109 108  CO2 23  --  25 24 23 23   GLUCOSE 196*  --  102* 110* 106* 122*  BUN 9  --  <5* <5* <5* 5*  CREATININE 1.12  --  0.63 0.61 0.62 0.66  CALCIUM 8.8*  --  7.8* 8.4* 8.7* 8.5*  MG  --  1.6*  --  2.4  --   --    GFR: Estimated Creatinine Clearance: 96.1 mL/min (by C-G formula based on SCr of 0.66 mg/dL). Liver Function Tests: Recent Labs  Lab 12/10/17 0451 12/11/17 0402 12/12/17 0524 12/13/17 0500 12/14/17 0403  AST 46* 24 31 25  14*  ALT 33 25 24 24 17   ALKPHOS 146* 120 115 117 102  BILITOT 0.7 0.9 0.7 1.0 0.8   PROT 7.1 6.3* 6.4* 6.4* 6.0*  ALBUMIN 3.7 3.2* 3.3* 3.2* 2.8*   No results for input(s): LIPASE, AMYLASE in the last 168 hours. No results for input(s): AMMONIA in the last 168 hours. Coagulation Profile: Recent Labs  Lab 12/10/17 0451  INR 0.96   Cardiac Enzymes: Recent Labs  Lab 12/10/17 0451  TROPONINI <0.03   BNP (last 3 results) No results for input(s): PROBNP in the last 8760 hours. HbA1C: No results for input(s): HGBA1C in the last 72 hours. CBG: No results for input(s): GLUCAP in the last 168 hours. Lipid Profile: No results for input(s): CHOL, HDL, LDLCALC, TRIG, CHOLHDL, LDLDIRECT in the last 72 hours. Thyroid Function Tests: No results for input(s): TSH, T4TOTAL, FREET4, T3FREE, THYROIDAB in the last 72 hours. Anemia Panel: No results for input(s):  VITAMINB12, FOLATE, FERRITIN, TIBC, IRON, RETICCTPCT in the last 72 hours. Sepsis Labs: Recent Labs  Lab 12/10/17 0457 12/10/17 0737  LATICACIDVEN 5.22* 1.39    Recent Results (from the past 240 hour(s))  Blood Culture (routine x 2)     Status: None (Preliminary result)   Collection Time: 12/10/17  4:43 AM  Result Value Ref Range Status   Specimen Description BLOOD RIGHT ARM  Final   Special Requests   Final    BOTTLES DRAWN AEROBIC AND ANAEROBIC Blood Culture adequate volume   Culture   Final    NO GROWTH 4 DAYS Performed at [facility], [address] [place], [place] [address]    Report Status PENDING  Incomplete  Blood Culture (routine x 2)     Status: None (Preliminary result)   Collection Time: 12/10/17  4:48 AM  Result Value Ref Range Status   Specimen Description BLOOD LEFT ARM  Final   Special Requests   Final    BOTTLES DRAWN AEROBIC AND ANAEROBIC Blood Culture adequate volume   Culture   Final    NO GROWTH 4 DAYS Performed at [facility], [address] [place], [place] [address]    Report Status PENDING  Incomplete  MRSA PCR Screening     Status: None   Collection Time: 12/10/17  2:30  PM  Result Value Ref Range Status   MRSA by PCR NEGATIVE NEGATIVE Final    Comment:        The GeneXpert MRSA Assay (FDA approved for NASAL specimens only), is one component of a comprehensive MRSA colonization surveillance program. It is not intended to diagnose MRSA infection nor to guide or monitor treatment for MRSA infections. Performed at [facility], [address], [place], [place] [address]          Radiology Studies: No results found.      Scheduled Meds: . enoxaparin (LOVENOX) injection  65 mg Subcutaneous Q12H  . folic acid  1 mg Oral Daily  . levofloxacin  750 mg Oral Daily  . metoprolol tartrate  50 mg Oral QID  . multivitamin with minerals  1 tablet Oral Daily  . nicotine  21 mg Transdermal Daily  . pantoprazole  40 mg Oral Q0600  . senna  1 tablet Oral BID  . sodium chloride flush  3 mL Intravenous Q12H  . thiamine  100 mg Oral Daily   Or  . thiamine  100 mg Intravenous Daily   Continuous Infusions: . sodium chloride 60 mL/hr at 12/14/17 1509  . dexmedetomidine (PRECEDEX) IV infusion 0.6 mcg/kg/hr (12/14/17 1008)  . diltiazem (CARDIZEM) infusion 5 mg/hr (12/14/17 0116)     LOS: 4 days    Critical care Time spent: 66mins.  Patient remains critically ill, requiring intravenous sedation for delirium tremens with intravenous Precedex and needs frequent monitoring    Kathie Dike, MD [facility] Pager [phone]  If 7PM-7AM, please contact night-coverage [email or web address] Password [facility] 12/14/2017, 4:57 PM

## 2017-12-14 NOTE — Clinical Social Work Note (Signed)
LCSW following. Pt still on Precedex per MD. MD anticipating pt will remain hospitalized through the weekend. Covering CSW will follow up Monday to provide ETOH treatment resources.

## 2017-12-15 LAB — CULTURE, BLOOD (ROUTINE X 2)
CULTURE: NO GROWTH
Culture: NO GROWTH
SPECIAL REQUESTS: ADEQUATE
SPECIAL REQUESTS: ADEQUATE

## 2017-12-15 MED ORDER — DILTIAZEM HCL 60 MG PO TABS
60.0000 mg | ORAL_TABLET | Freq: Two times a day (BID) | ORAL | Status: DC
  Administered 2017-12-15 (×2): 60 mg via ORAL
  Filled 2017-12-15 (×2): qty 1

## 2017-12-15 NOTE — Progress Notes (Signed)
PROGRESS NOTE    Nicholas Avila  OBS:[id number] DOB: 1960/05/30 DOA: 12/10/2017 PCP: Patient, No Pcp Per    Brief Narrative:  57 year old male admitted to the hospital with fever, cough, feeling generally unwell.  Found to have evidence of pulmonary infection/sepsis.  Also had new onset atrial flutter with rapid ventricular response.  Started on intravenous Cardizem as well as intravenous heparin.  Cardiology following.  Overall heart rates have improved.  Hospital course was complicated by development of alcohol withdrawal with delirium tremens.  He required precedex infusion, but has since improved and withdrawal has resolved. Currently main barrier to discharge is heart rate control since he is still requiring cardizem infusion. Continue to wean off infusion while adjusting po meds.   Assessment & Plan:   Principal Problem:   Tachycardia Active Problems:   Syncopal episodes   Abnormal EKG   Hyperglycemia   Sepsis (HCC)   Calcium blood decreased   Atrial fibrillation, transient (HCC)   Smoking   Alcohol withdrawal syndrome with complication, with unspecified complication ([place])   Delirium tremens ([place])   1. Sepsis.  Possibly related to bronchitis.  No evidence of pneumonia on CT imaging.  Received IV antibiotics in the emergency room, these were transitioned to oral Levaquin and he has completed his course of antibiotics.  Blood cultures show no growth.  Hemodynamics have stabilized after receiving IV fluids and lactic acid has trended down to normal.  We will continue to monitor.   2. Atrial flutter with RVR.  Heart rate is being controlled with oral metoprolol. He was on cardizem infusion, which was weaned of yesterday. Unfortunately, patient became tachycardic again overnight and he had to be restarted on cardizem infusion. Will start on oral cardizem and continue to try and wean off cardizem infusion. Echocardiogram does not show any acute abnormalities.  Anticoagulation has been  transitioned to Eliquis 3. Near syncope.  Patient reported a near syncopal episode prior to admission.  CT head was unremarkable.  Suspect is related to volume depletion and dehydration.  Symptoms have resolved. 4. Acute alcohol withdrawal with delirium tremens.  Patient reportedly drinks 3 to 4, 40 ounce beers a day.  He developed significant alcohol withdrawal.  He was initially treated with intravenous Ativan, but did not have any significant response.  He was subsequently transitioned to Precedex with improvement of his symptoms.  Overall, he has improved.  Precedex has been weaned off and mental status is back to baseline 5. Acute bronchitis.  No evidence of pneumonia on CT imaging.  Continue supportive management.  Respiratory status is stable 6. Pulmonary nodules.  Repeat CT chest in 3 to 6 months. 7. Tobacco use.  Counseled on the importance of tobacco cessation.   DVT prophylaxis: Lovenox Code Status: full code Family Communication: No family at bedside Disposition Plan: discharge home when improved   Consultants:   cardiology  Procedures:  Echo: - Left ventricle: The cavity size was normal. Wall thickness was   normal. Systolic function was normal. The estimated ejection   fraction was in the range of 50% to 55%. Wall motion was normal;   there were no regional wall motion abnormalities. The study is   not technically sufficient to allow evaluation of LV diastolic   function. - Aortic valve: Mildly calcified annulus. Trileaflet; normal   thickness leaflets. Valve area (VTI): 2.53 cm^2. Valve area   (Vmax): 2.77 cm^2. - Mitral valve: There was mild regurgitation. - Left atrium: The atrium was mildly dilated. - Right  ventricle: The cavity size was mildly to moderately   dilated. Systolic function was mildly reduced. - Right atrium: The atrium was moderately dilated. - Tricuspid valve: There was moderate regurgitation. - Pulmonary arteries: Systolic pressure was mildly to  moderately   increased. PA peak pressure: 39 mm Hg (S).  - Technically adequate study.  Antimicrobials:       Subjective: Was weaned off precedex yesterday and is feeling well. Became tachycardic overnight and had to be restarted on cardizem infusion  Objective: Vitals:   12/15/17 0900 12/15/17 1000 12/15/17 1100 12/15/17 1200  BP: (!) 150/100 (!) 158/91 (!) 163/131 137/61  Pulse:      Resp:    (!) 44  Temp:      TempSrc:      SpO2:      Weight:      Height:        Intake/Output Summary (Last 24 hours) at 12/15/2017 1224 Last data filed at 12/15/2017 1200 Gross per 24 hour  Intake 2594.68 ml  Output 3000 ml  Net -405.32 ml   Filed Weights   12/12/17 0356 12/14/17 0419 12/15/17 0500  Weight: 63.6 kg (140 lb 3.4 oz) 65.9 kg (145 lb 4.5 oz) 62.9 kg (138 lb 10.7 oz)    Examination:  General exam: Alert, awake, oriented x 3 Respiratory system: Clear to auscultation. Respiratory effort normal. Cardiovascular system:irregular. No murmurs, rubs, gallops. Gastrointestinal system: Abdomen is nondistended, soft and nontender. No organomegaly or masses felt. Normal bowel sounds heard. Central nervous system: Alert and oriented. No focal neurological deficits. Extremities: No C/C/E, +pedal pulses Skin: No rashes, lesions or ulcers Psychiatry: Judgement and insight appear normal. Mood & affect appropriate.        Data Reviewed: I have personally reviewed following labs and imaging studies  CBC: Recent Labs  Lab 12/10/17 0451 12/11/17 0402 12/12/17 0524 12/13/17 0500 12/14/17 0403  WBC 8.7 7.4 7.3 9.4 6.2  NEUTROABS 6.5  --   --   --   --   HGB 13.1 12.3* 11.7* 12.2* 11.8*  HCT 39.2 37.6* 36.0* 37.3* 36.6*  MCV 94.9 94.5 96.8 96.1 95.6  PLT 272 242 203 197 749   Basic Metabolic Panel: Recent Labs  Lab 12/10/17 0451 12/10/17 0923 12/11/17 0402 12/12/17 0524 12/13/17 0500 12/14/17 0403  NA 135  --  138 138 139 138  K 3.6  --  3.6 5.3* 4.2 3.6  CL 97*  --   104 107 109 108  CO2 23  --  25 24 23 23   GLUCOSE 196*  --  102* 110* 106* 122*  BUN 9  --  <5* <5* <5* 5*  CREATININE 1.12  --  0.63 0.61 0.62 0.66  CALCIUM 8.8*  --  7.8* 8.4* 8.7* 8.5*  MG  --  1.6*  --  2.4  --   --    GFR: Estimated Creatinine Clearance: 91.7 mL/min (by C-G formula based on SCr of 0.66 mg/dL). Liver Function Tests: Recent Labs  Lab 12/10/17 0451 12/11/17 0402 12/12/17 0524 12/13/17 0500 12/14/17 0403  AST 46* 24 31 25  14*  ALT 33 25 24 24 17   ALKPHOS 146* 120 115 117 102  BILITOT 0.7 0.9 0.7 1.0 0.8  PROT 7.1 6.3* 6.4* 6.4* 6.0*  ALBUMIN 3.7 3.2* 3.3* 3.2* 2.8*   No results for input(s): LIPASE, AMYLASE in the last 168 hours. No results for input(s): AMMONIA in the last 168 hours. Coagulation Profile: Recent Labs  Lab 12/10/17 0451  INR  0.96   Cardiac Enzymes: Recent Labs  Lab 12/10/17 0451  TROPONINI <0.03   BNP (last 3 results) No results for input(s): PROBNP in the last 8760 hours. HbA1C: No results for input(s): HGBA1C in the last 72 hours. CBG: No results for input(s): GLUCAP in the last 168 hours. Lipid Profile: No results for input(s): CHOL, HDL, LDLCALC, TRIG, CHOLHDL, LDLDIRECT in the last 72 hours. Thyroid Function Tests: No results for input(s): TSH, T4TOTAL, FREET4, T3FREE, THYROIDAB in the last 72 hours. Anemia Panel: No results for input(s): VITAMINB12, FOLATE, FERRITIN, TIBC, IRON, RETICCTPCT in the last 72 hours. Sepsis Labs: Recent Labs  Lab 12/10/17 0457 12/10/17 0737  LATICACIDVEN 5.22* 1.39    Recent Results (from the past 240 hour(s))  Blood Culture (routine x 2)     Status: None   Collection Time: 12/10/17  4:43 AM  Result Value Ref Range Status   Specimen Description BLOOD RIGHT ARM  Final   Special Requests   Final    BOTTLES DRAWN AEROBIC AND ANAEROBIC Blood Culture adequate volume   Culture   Final    NO GROWTH 5 DAYS Performed at [facility], [address], [place], [place] [address]    Report  Status 12/15/2017 FINAL  Final  Blood Culture (routine x 2)     Status: None   Collection Time: 12/10/17  4:48 AM  Result Value Ref Range Status   Specimen Description BLOOD LEFT ARM  Final   Special Requests   Final    BOTTLES DRAWN AEROBIC AND ANAEROBIC Blood Culture adequate volume   Culture   Final    NO GROWTH 5 DAYS Performed at [facility], [address] [place], [place] [address]    Report Status 12/15/2017 FINAL  Final  MRSA PCR Screening     Status: None   Collection Time: 12/10/17  2:30 PM  Result Value Ref Range Status   MRSA by PCR NEGATIVE NEGATIVE Final    Comment:        The GeneXpert MRSA Assay (FDA approved for NASAL specimens only), is one component of a comprehensive MRSA colonization surveillance program. It is not intended to diagnose MRSA infection nor to guide or monitor treatment for MRSA infections. Performed at [facility], [address], [place], [place] [address]          Radiology Studies: No results found.      Scheduled Meds: . apixaban  5 mg Oral BID  . diltiazem  60 mg Oral Q12H  . folic acid  1 mg Oral Daily  . metoprolol tartrate  50 mg Oral QID  . multivitamin with minerals  1 tablet Oral Daily  . nicotine  21 mg Transdermal Daily  . pantoprazole  40 mg Oral Q0600  . senna  1 tablet Oral BID  . sodium chloride flush  3 mL Intravenous Q12H  . thiamine  100 mg Oral Daily   Or  . thiamine  100 mg Intravenous Daily   Continuous Infusions: . sodium chloride 60 mL/hr at 12/14/17 1509  . diltiazem (CARDIZEM) infusion Stopped (12/15/17 1115)     LOS: 5 days    Critical care Time spent: 30mins. Patient requires frequent monitoring and adjustment of diltiazem infusion to maintain stable heart rate.    Kathie Dike, MD [facility] Pager [phone]  If 7PM-7AM, please contact night-coverage [email or web address] Password [facility] 12/15/2017, 12:24 PM

## 2017-12-16 ENCOUNTER — Encounter (HOSPITAL_COMMUNITY): Payer: Self-pay

## 2017-12-16 MED ORDER — METOPROLOL SUCCINATE ER 50 MG PO TB24
100.0000 mg | ORAL_TABLET | Freq: Two times a day (BID) | ORAL | Status: DC
  Administered 2017-12-16 (×2): 100 mg via ORAL
  Filled 2017-12-16 (×2): qty 2

## 2017-12-16 MED ORDER — METOPROLOL TARTRATE 5 MG/5ML IV SOLN
5.0000 mg | Freq: Once | INTRAVENOUS | Status: AC
  Administered 2017-12-16: 5 mg via INTRAVENOUS
  Filled 2017-12-16: qty 5

## 2017-12-16 MED ORDER — DILTIAZEM HCL ER COATED BEADS 180 MG PO CP24: 180.0000 mg | ORAL_CAPSULE | Freq: Every day | ORAL | Status: DC

## 2017-12-16 MED ORDER — DILTIAZEM HCL 25 MG/5ML IV SOLN
10.0000 mg | Freq: Once | INTRAVENOUS | Status: AC
  Administered 2017-12-16: 10 mg via INTRAVENOUS
  Filled 2017-12-16: qty 5

## 2017-12-16 MED ORDER — METOPROLOL SUCCINATE ER 50 MG PO TB24: 100.0000 mg | ORAL_TABLET | Freq: Two times a day (BID) | ORAL | Status: DC

## 2017-12-16 MED ORDER — DILTIAZEM HCL ER COATED BEADS 180 MG PO CP24
180.0000 mg | ORAL_CAPSULE | Freq: Every day | ORAL | Status: DC
  Administered 2017-12-16: 180 mg via ORAL
  Filled 2017-12-16: qty 1

## 2017-12-16 NOTE — Progress Notes (Signed)
PROGRESS NOTE    Nicholas Avila  YPP:[id number] DOB: 1961/04/26 DOA: 12/10/2017 PCP: Patient, No Pcp Per    Brief Narrative:  57 year old male admitted to the hospital with fever, cough, feeling generally unwell.  Found to have evidence of pulmonary infection/sepsis.  Also had new onset atrial flutter with rapid ventricular response.  Started on intravenous Cardizem as well as intravenous heparin.  Cardiology following.  Overall heart rates have improved.  Hospital course was complicated by development of alcohol withdrawal with delirium tremens.  He required precedex infusion, but has since improved and withdrawal has resolved. Currently main barrier to discharge is heart rate control since he is still requiring cardizem infusion. Continue to wean off infusion while adjusting po meds.   Assessment & Plan:   Principal Problem:   Tachycardia Active Problems:   Syncopal episodes   Abnormal EKG   Hyperglycemia   Sepsis (HCC)   Calcium blood decreased   Atrial fibrillation, transient (HCC)   Smoking   Alcohol withdrawal syndrome with complication, with unspecified complication ([place])   Delirium tremens ([place])   1. Sepsis.  Possibly related to bronchitis.  No evidence of pneumonia on CT imaging.  Received IV antibiotics in the emergency room, these were transitioned to oral Levaquin and he has completed his course of antibiotics.  Blood cultures show no growth.  Hemodynamics have stabilized after receiving IV fluids and lactic acid has trended down to normal.  We will continue to monitor.   2. Atrial flutter with RVR.  Heart rate has been difficult to control.  He was on cardizem infusion, which was weaned off.  He has been started on oral cardizem and oral metoprolol with suboptimal HR control.  Oral diltiazem increased to 160 mg today.  Echocardiogram does not show any acute abnormalities.  Anticoagulation has been transitioned to Eliquis 3. Near syncope.  Patient reported a near syncopal  episode prior to admission.  CT head was unremarkable.  Suspect is related to volume depletion and dehydration.  Symptoms have resolved. 4. Acute alcohol withdrawal with delirium tremens.  Patient reportedly drinks 3 to 4, 40 ounce beers a day.  He developed significant alcohol withdrawal.  He was initially treated with intravenous Ativan, but did not have any significant response.  He was subsequently transitioned to Precedex with improvement of his symptoms.  Overall, he has improved.  Precedex has been weaned off and mental status is back to baseline 5. Acute bronchitis.  No evidence of pneumonia on CT imaging.  Continue supportive management.  Respiratory status is stable.  6. Pulmonary nodules.  Repeat CT chest in 3 to 6 months. 7. Tobacco use.  Counseled on the importance of tobacco cessation.  Pt verbalized understanding.    DVT prophylaxis: Lovenox Code Status: full code Family Communication: No family at bedside Disposition Plan: discharge home held due to HR uncontrolled on oral meds, keep in stepdown unit today   Consultants:   cardiology  Procedures:  Echo: - Left ventricle: The cavity size was normal. Wall thickness was   normal. Systolic function was normal. The estimated ejection   fraction was in the range of 50% to 55%. Wall motion was normal;   there were no regional wall motion abnormalities. The study is   not technically sufficient to allow evaluation of LV diastolic   function. - Aortic valve: Mildly calcified annulus. Trileaflet; normal   thickness leaflets. Valve area (VTI): 2.53 cm^2. Valve area   (Vmax): 2.77 cm^2. - Mitral valve: There was mild  regurgitation. - Left atrium: The atrium was mildly dilated. - Right ventricle: The cavity size was mildly to moderately   dilated. Systolic function was mildly reduced. - Right atrium: The atrium was moderately dilated. - Tricuspid valve: There was moderate regurgitation. - Pulmonary arteries: Systolic pressure was  mildly to moderately   increased. PA peak pressure: 39 mm Hg (S).  - Technically adequate study.  Subjective: Pt feeling better, he is tachycardic again to 130s with palpitations  Objective: Vitals:   12/16/17 1000 12/16/17 1100 12/16/17 1200 12/16/17 1300  BP:   (!) 144/109   Pulse:      Resp: 13 20 16  (!) 24  Temp:      TempSrc:      SpO2:      Weight:      Height:        Intake/Output Summary (Last 24 hours) at 12/16/2017 1327 Last data filed at 12/16/2017 0900 Gross per 24 hour  Intake 360 ml  Output -  Net 360 ml   Filed Weights   12/12/17 0356 12/14/17 0419 12/15/17 0500  Weight: 63.6 kg (140 lb 3.4 oz) 65.9 kg (145 lb 4.5 oz) 62.9 kg (138 lb 10.7 oz)    Examination:  General exam: Alert, awake, oriented x 3. Pt sitting up in bed.   Respiratory system: Clear to auscultation. Respiratory effort normal. Cardiovascular system:irregular. Tachycardic, No murmurs, rubs, gallops. Gastrointestinal system: Abdomen is nondistended, soft and nontender. No organomegaly or masses felt. Normal bowel sounds heard. Central nervous system: Alert and oriented. No focal neurological deficits. Extremities: No C/C/E, +pedal pulses Skin: No rashes, lesions or ulcers Psychiatry: Judgement and insight appear normal. Mood & affect appropriate.   Data Reviewed: I have personally reviewed following labs and imaging studies  CBC: Recent Labs  Lab 12/10/17 0451 12/11/17 0402 12/12/17 0524 12/13/17 0500 12/14/17 0403  WBC 8.7 7.4 7.3 9.4 6.2  NEUTROABS 6.5  --   --   --   --   HGB 13.1 12.3* 11.7* 12.2* 11.8*  HCT 39.2 37.6* 36.0* 37.3* 36.6*  MCV 94.9 94.5 96.8 96.1 95.6  PLT 272 242 203 197 696   Basic Metabolic Panel: Recent Labs  Lab 12/10/17 0451 12/10/17 0923 12/11/17 0402 12/12/17 0524 12/13/17 0500 12/14/17 0403  NA 135  --  138 138 139 138  K 3.6  --  3.6 5.3* 4.2 3.6  CL 97*  --  104 107 109 108  CO2 23  --  25 24 23 23   GLUCOSE 196*  --  102* 110* 106* 122*    BUN 9  --  <5* <5* <5* 5*  CREATININE 1.12  --  0.63 0.61 0.62 0.66  CALCIUM 8.8*  --  7.8* 8.4* 8.7* 8.5*  MG  --  1.6*  --  2.4  --   --    GFR: Estimated Creatinine Clearance: 91.7 mL/min (by C-G formula based on SCr of 0.66 mg/dL). Liver Function Tests: Recent Labs  Lab 12/10/17 0451 12/11/17 0402 12/12/17 0524 12/13/17 0500 12/14/17 0403  AST 46* 24 31 25  14*  ALT 33 25 24 24 17   ALKPHOS 146* 120 115 117 102  BILITOT 0.7 0.9 0.7 1.0 0.8  PROT 7.1 6.3* 6.4* 6.4* 6.0*  ALBUMIN 3.7 3.2* 3.3* 3.2* 2.8*   No results for input(s): LIPASE, AMYLASE in the last 168 hours. No results for input(s): AMMONIA in the last 168 hours. Coagulation Profile: Recent Labs  Lab 12/10/17 0451  INR 0.96   Cardiac  Enzymes: Recent Labs  Lab 12/10/17 0451  TROPONINI <0.03   BNP (last 3 results) No results for input(s): PROBNP in the last 8760 hours. HbA1C: No results for input(s): HGBA1C in the last 72 hours. CBG: No results for input(s): GLUCAP in the last 168 hours. Lipid Profile: No results for input(s): CHOL, HDL, LDLCALC, TRIG, CHOLHDL, LDLDIRECT in the last 72 hours. Thyroid Function Tests: No results for input(s): TSH, T4TOTAL, FREET4, T3FREE, THYROIDAB in the last 72 hours. Anemia Panel: No results for input(s): VITAMINB12, FOLATE, FERRITIN, TIBC, IRON, RETICCTPCT in the last 72 hours. Sepsis Labs: Recent Labs  Lab 12/10/17 0457 12/10/17 0737  LATICACIDVEN 5.22* 1.39    Recent Results (from the past 240 hour(s))  Blood Culture (routine x 2)     Status: None   Collection Time: 12/10/17  4:43 AM  Result Value Ref Range Status   Specimen Description BLOOD RIGHT ARM  Final   Special Requests   Final    BOTTLES DRAWN AEROBIC AND ANAEROBIC Blood Culture adequate volume   Culture   Final    NO GROWTH 5 DAYS Performed at [facility], [address] [place], [place] [address]    Report Status 12/15/2017 FINAL  Final  Blood Culture (routine x 2)     Status: None    Collection Time: 12/10/17  4:48 AM  Result Value Ref Range Status   Specimen Description BLOOD LEFT ARM  Final   Special Requests   Final    BOTTLES DRAWN AEROBIC AND ANAEROBIC Blood Culture adequate volume   Culture   Final    NO GROWTH 5 DAYS Performed at [facility], [address], [place], [place] [address]    Report Status 12/15/2017 FINAL  Final  MRSA PCR Screening     Status: None   Collection Time: 12/10/17  2:30 PM  Result Value Ref Range Status   MRSA by PCR NEGATIVE NEGATIVE Final    Comment:        The GeneXpert MRSA Assay (FDA approved for NASAL specimens only), is one component of a comprehensive MRSA colonization surveillance program. It is not intended to diagnose MRSA infection nor to guide or monitor treatment for MRSA infections. Performed at [facility], [address] [place], [place] [address]     Radiology Studies: No results found.  Scheduled Meds: . apixaban  5 mg Oral BID  . diltiazem  180 mg Oral Daily  . folic acid  1 mg Oral Daily  . metoprolol succinate  100 mg Oral BID  . multivitamin with minerals  1 tablet Oral Daily  . nicotine  21 mg Transdermal Daily  . pantoprazole  40 mg Oral Q0600  . senna  1 tablet Oral BID  . sodium chloride flush  3 mL Intravenous Q12H  . thiamine  100 mg Oral Daily   Or  . thiamine  100 mg Intravenous Daily   Continuous Infusions: . diltiazem (CARDIZEM) infusion Stopped (12/15/17 1115)     LOS: 6 days    Critical care Time spent: 33 mins.  Irwin Brakeman, MD [facility] Pager [phone]  If 7PM-7AM, please contact night-coverage [email or web address] Password [email or web address] 12/16/2017, 1:27 PM

## 2017-12-16 NOTE — Progress Notes (Signed)
HR progressively increasing this AM up to 135 bpm. Pt at rest, denies pain, sob or palpitations. Dr Nicholas Avila notified. Cardizem 10 IVP ordered and given. HR decreasing to 100 bpm

## 2017-12-17 DIAGNOSIS — R790 Abnormal level of blood mineral: Secondary | ICD-10-CM

## 2017-12-17 DIAGNOSIS — R9431 Abnormal electrocardiogram [ECG] [EKG]: Secondary | ICD-10-CM

## 2017-12-17 DIAGNOSIS — R000 Tachycardia, unspecified: Secondary | ICD-10-CM

## 2017-12-17 DIAGNOSIS — I4891 Unspecified atrial fibrillation: Secondary | ICD-10-CM

## 2017-12-17 DIAGNOSIS — A419 Sepsis, unspecified organism: Principal | ICD-10-CM

## 2017-12-17 DIAGNOSIS — F17200 Nicotine dependence, unspecified, uncomplicated: Secondary | ICD-10-CM

## 2017-12-17 DIAGNOSIS — F10239 Alcohol dependence with withdrawal, unspecified: Secondary | ICD-10-CM

## 2017-12-17 DIAGNOSIS — R55 Syncope and collapse: Secondary | ICD-10-CM

## 2017-12-17 DIAGNOSIS — R739 Hyperglycemia, unspecified: Secondary | ICD-10-CM

## 2017-12-17 LAB — COMPREHENSIVE METABOLIC PANEL
ALBUMIN: 3.4 g/dL — AB (ref 3.5–5.0)
ALT: 12 U/L (ref 0–44)
AST: 14 U/L — ABNORMAL LOW (ref 15–41)
Alkaline Phosphatase: 105 U/L (ref 38–126)
Anion gap: 9 (ref 5–15)
BILIRUBIN TOTAL: 0.5 mg/dL (ref 0.3–1.2)
BUN: 7 mg/dL (ref 6–20)
CALCIUM: 9 mg/dL (ref 8.9–10.3)
CHLORIDE: 103 mmol/L (ref 98–111)
CO2: 27 mmol/L (ref 22–32)
Creatinine, Ser: 0.62 mg/dL (ref 0.61–1.24)
GFR calc Af Amer: >60 mL/min (ref >60)
GFR calc non Af Amer: >60 mL/min (ref >60)
GLUCOSE: 133 mg/dL — AB (ref 70–99)
POTASSIUM: 3.5 mmol/L (ref 3.5–5.1)
Sodium: 139 mmol/L (ref 135–145)
TOTAL PROTEIN: 6.9 g/dL (ref 6.5–8.1)

## 2017-12-17 LAB — CBC WITH DIFFERENTIAL/PLATELET
Basophils Absolute: 0 10*3/uL (ref 0.0–0.1)
Basophils Relative: 0 %
Eosinophils Absolute: 0.3 10*3/uL (ref 0.0–0.7)
Eosinophils Relative: 3 %
HEMATOCRIT: 39.2 % (ref 39.0–52.0)
Hemoglobin: 12.9 g/dL — ABNORMAL LOW (ref 13.0–17.0)
Lymphocytes Relative: 18 %
Lymphs Abs: 1.6 10*3/uL (ref 0.7–4.0)
MCH: 31.2 pg (ref 26.0–34.0)
MCHC: 32.9 g/dL (ref 30.0–36.0)
MCV: 94.9 fL (ref 78.0–100.0)
MONO ABS: 0.9 10*3/uL (ref 0.1–1.0)
Monocytes Relative: 10 %
NEUTROS ABS: 6.3 10*3/uL (ref 1.7–7.7)
NEUTROS PCT: 69 %
Platelets: 282 10*3/uL (ref 150–400)
RBC: 4.13 MIL/uL — ABNORMAL LOW (ref 4.22–5.81)
RDW: 14.4 % (ref 11.5–15.5)
WBC: 9.1 10*3/uL (ref 4.0–10.5)

## 2017-12-17 LAB — PHOSPHORUS: PHOSPHORUS: 4.5 mg/dL (ref 2.5–4.6)

## 2017-12-17 LAB — MAGNESIUM: MAGNESIUM: 1.6 mg/dL — AB (ref 1.7–2.4)

## 2017-12-17 MED ORDER — THIAMINE HCL 100 MG PO TABS: 100.0000 mg | ORAL_TABLET | Freq: Every day | ORAL | Status: DC

## 2017-12-17 MED ORDER — METOPROLOL SUCCINATE ER 50 MG PO TB24
100.0000 mg | ORAL_TABLET | Freq: Two times a day (BID) | ORAL | Status: DC
  Administered 2017-12-17: 100 mg via ORAL
  Filled 2017-12-17: qty 2

## 2017-12-17 MED ORDER — METOPROLOL SUCCINATE ER 100 MG PO TB24: 100.0000 mg | ORAL_TABLET | Freq: Two times a day (BID) | ORAL | 0 refills | Status: DC

## 2017-12-17 MED ORDER — SODIUM CHLORIDE 0.9 % IV SOLN
INTRAVENOUS | Status: DC | PRN
  Administered 2017-12-17: 250 mL via INTRAVENOUS

## 2017-12-17 MED ORDER — MAGNESIUM SULFATE 2 GM/50ML IV SOLN
2.0000 g | Freq: Once | INTRAVENOUS | Status: AC
  Administered 2017-12-17: 2 g via INTRAVENOUS
  Filled 2017-12-17: qty 50

## 2017-12-17 MED ORDER — ADULT MULTIVITAMIN W/MINERALS CH: 1.0000 | ORAL_TABLET | Freq: Every day | ORAL | Status: DC

## 2017-12-17 MED ORDER — DILTIAZEM HCL ER COATED BEADS 240 MG PO CP24: 240.0000 mg | ORAL_CAPSULE | Freq: Every day | ORAL | 0 refills | Status: DC

## 2017-12-17 MED ORDER — FOLIC ACID 1 MG PO TABS: 1.0000 mg | ORAL_TABLET | Freq: Every day | ORAL | 0 refills | Status: DC

## 2017-12-17 MED ORDER — DILTIAZEM HCL ER COATED BEADS 240 MG PO CP24: 240.0000 mg | ORAL_CAPSULE | Freq: Every day | ORAL | Status: DC

## 2017-12-17 MED ORDER — FOLIC ACID 1 MG PO TABS: 1.0000 mg | ORAL_TABLET | Freq: Every day | ORAL | Status: DC

## 2017-12-17 MED ORDER — APIXABAN 5 MG PO TABS: 5.0000 mg | ORAL_TABLET | Freq: Two times a day (BID) | ORAL | 0 refills | Status: DC

## 2017-12-17 MED ORDER — THIAMINE HCL 100 MG PO TABS: 100.0000 mg | ORAL_TABLET | Freq: Every day | ORAL | 0 refills | Status: AC

## 2017-12-17 MED ORDER — DILTIAZEM HCL ER COATED BEADS 240 MG PO CP24
240.0000 mg | ORAL_CAPSULE | Freq: Every day | ORAL | Status: DC
  Administered 2017-12-17: 240 mg via ORAL
  Filled 2017-12-17: qty 1

## 2017-12-17 MED ORDER — ADULT MULTIVITAMIN W/MINERALS CH
1.0000 | ORAL_TABLET | Freq: Every day | ORAL | 0 refills | Status: AC
Start: 2017-12-18 — End: 2018-01-18

## 2017-12-17 MED ORDER — APIXABAN 5 MG PO TABS: 5.0000 mg | ORAL_TABLET | Freq: Two times a day (BID) | ORAL | 1 refills | Status: DC

## 2017-12-17 NOTE — Care Management Note (Addendum)
Case Management Note  Patient Details  Name: Nicholas Avila MRN: [id number] Date of Birth: Mar 11, 1961  Subjective/Objective:                    Action/Plan:  Patient discharging home today. At bedside to discuss patient being able to afford Eliquis and he states he does have BCBS. Financial counselor to bedside to confirm. Patient is active with his BCBS.  Provided patient with $10 copay coupon for Eliquis. No other CM needs.  Patient to follow up with cardiolgy.   Expected Discharge Date:  12/17/17               Expected Discharge Plan:  Home/Self Care  In-House Referral:     Discharge planning Services  Medication Assistance Post Acute Care Choice:  NA Choice offered to:  NA  DME Arranged:    DME Agency:     HH Arranged:    HH Agency:     Status of Service:  In process, will continue to follow  If discussed at Long Length of Stay Meetings, dates discussed:    Additional Comments:  Jolayne Branson, Chauncey Reading, RN 12/17/2017, 11:12 AM

## 2017-12-17 NOTE — Progress Notes (Addendum)
Progress Note  Patient Name: Nicholas Avila Date of Encounter: 12/17/2017  Primary Cardiologist: Carlyle Dolly, MD   Subjective   Feeling much better. Hoping to go home today  Inpatient Medications    Scheduled Meds: . apixaban  5 mg Oral BID  . diltiazem  240 mg Oral Daily  . folic acid  1 mg Oral Daily  . metoprolol succinate  100 mg Oral BID  . multivitamin with minerals  1 tablet Oral Daily  . nicotine  21 mg Transdermal Daily  . pantoprazole  40 mg Oral Q0600  . senna  1 tablet Oral BID  . sodium chloride flush  3 mL Intravenous Q12H  . thiamine  100 mg Oral Daily   Or  . thiamine  100 mg Intravenous Daily   Continuous Infusions: . sodium chloride 250 mL (12/17/17 0028)  . diltiazem (CARDIZEM) infusion Stopped (12/17/17 0526)  . magnesium sulfate 1 - 4 g bolus IVPB 2 g (12/17/17 0745)   PRN Meds: sodium chloride, acetaminophen **OR** acetaminophen, ipratropium-albuterol, LORazepam, morphine injection, ondansetron **OR** ondansetron (ZOFRAN) IV, traZODone   Vital Signs    Vitals:   12/17/17 0528 12/17/17 0529 12/17/17 0530 12/17/17 0532  BP: 125/69  114/86   Pulse: 73 82 78 89  Resp: 20 (!) 21 (!) 21 16  Temp:      TempSrc:      SpO2: 100% 100% 100% 100%  Weight:      Height:        Intake/Output Summary (Last 24 hours) at 12/17/2017 0814 Last data filed at 12/17/2017 0600 Gross per 24 hour  Intake 603 ml  Output 350 ml  Net 253 ml   Filed Weights   12/14/17 0419 12/15/17 0500 12/17/17 0500  Weight: 145 lb 4.5 oz (65.9 kg) 138 lb 10.7 oz (62.9 kg) 131 lb 6.3 oz (59.6 kg)    Telemetry    Atrial flutter at 80-90/m - Personally Reviewed  ECG       Physical Exam    GEN: No acute distress.   Neck: No JVD Cardiac: RRR, no murmurs, rubs, or gallops.  Respiratory: Clear to auscultation bilaterally. GI: Soft, nontender, non-distended  MS: No edema; No deformity. Neuro:  Nonfocal  Psych: Normal affect   Labs    Chemistry Recent Labs  Lab  12/13/17 0500 12/14/17 0403 12/17/17 0425  NA 139 138 139  K 4.2 3.6 3.5  CL 109 108 103  CO2 23 23 27   GLUCOSE 106* 122* 133*  BUN <5* 5* 7  CREATININE 0.62 0.66 0.62  CALCIUM 8.7* 8.5* 9.0  PROT 6.4* 6.0* 6.9  ALBUMIN 3.2* 2.8* 3.4*  AST 25 14* 14*  ALT 24 17 12   ALKPHOS 117 102 105  BILITOT 1.0 0.8 0.5  GFRNONAA >60 >60 >60  GFRAA >60 >60 >60  ANIONGAP 7 7 9      Hematology Recent Labs  Lab 12/13/17 0500 12/14/17 0403 12/17/17 0425  WBC 9.4 6.2 9.1  RBC 3.88* 3.83* 4.13*  HGB 12.2* 11.8* 12.9*  HCT 37.3* 36.6* 39.2  MCV 96.1 95.6 94.9  MCH 31.4 30.8 31.2  MCHC 32.7 32.2 32.9  RDW 14.8 14.6 14.4  PLT 197 203 282    Cardiac EnzymesNo results for input(s): TROPONINI in the last 168 hours. No results for input(s): TROPIPOC in the last 168 hours.   BNPNo results for input(s): BNP, PROBNP in the last 168 hours.   DDimer No results for input(s): DDIMER in the last 168 hours.  Radiology    No results found.  Cardiac Studies  Echo 12/11/17  ------------------------------------------------------------------- Study Conclusions   - Left ventricle: The cavity size was normal. Wall thickness was   normal. Systolic function was normal. The estimated ejection   fraction was in the range of 50% to 55%. Wall motion was normal;   there were no regional wall motion abnormalities. The study is   not technically sufficient to allow evaluation of LV diastolic   function. - Aortic valve: Mildly calcified annulus. Trileaflet; normal   thickness leaflets. Valve area (VTI): 2.53 cm^2. Valve area   (Vmax): 2.77 cm^2. - Mitral valve: There was mild regurgitation. - Left atrium: The atrium was mildly dilated. - Right ventricle: The cavity size was mildly to moderately   dilated. Systolic function was mildly reduced. - Right atrium: The atrium was moderately dilated. - Tricuspid valve: There was moderate regurgitation. - Pulmonary arteries: Systolic pressure was mildly to  moderately   increased. PA peak pressure: 39 mm Hg (S). - Technically adequate study.   Patient Profile     57 y.o. male admitted with sepsis and new onset Atrial flutter with RVR complicated by ETOH withdrawal  Assessment & Plan    Atrial flutter with RVR new onset in setting of sepsis and heavy ETOH use.   HR now contorolled off IV dilt. CHADS2Vasc=0 but plan for anticoagulation for 4 weeks for possible cardioversion. On Eliquis 5 mg BID, Cardizem 240 mg daily, Toprol XL 100 mg  BID. Will arrange outpatient f/u in our office in 2-3 weeks to schedule cardioversion.  Bronchitis/Sepsis per primary care team.  ETOH withdrawal resolved. Patient counseled on importance of etoh cessation  Tobacco abuse-needs to quit.     For questions or updates, please contact [place] Please consult [email or web address] for contact info under Cardiology/STEMI.      Signed, Ermalinda Barrios, PA-C  12/17/2017, 8:14 AM   Attending note  Patient seen and discussed with PA Bonnell Public, I agree with her documentation above. New onset aflutter in the setting of respiratory infection, later complicated by severe alcholol withdrawal refractory to benzodiazepines requiring precedex drip. CHADS2Vasc score of 0, anticoag at this time for possible cardioversion within the next few weeks. Currently on Toprol XL 100mg  bid, diltiazem increased to 240mg  daily this AM. Off precedex, EtOH withdrawal has resolved.  Anticoag started 12/10/17 with hep drip, then changed to eliquis, could have DCCV 8/19 or later if does not miss any does.  [facility] will sign off.   Medication Recommendations:  As currently prescribed Other recommendations (labs, testing, etc):  none Follow up as an outpatient:  We will arrange outpatient f/u 2-3 weeks. Will need EKG at that time, consider outpatient cardioversion if still in aflutter   Carlyle Dolly MD

## 2017-12-17 NOTE — Clinical Social Work Note (Addendum)
Clinical Social Work Assessment  Patient Details  Name: Nicholas Avila MRN: [id number] Date of Birth: 1961/04/15  Date of referral:  12/17/17               Reason for consult:  Substance Use/ETOH Abuse                Permission sought to share information with:    Permission granted to share information::     Name::        Agency::     Relationship::     Contact Information:     Housing/Transportation Living arrangements for the past 2 months:  Apartment Source of Information:  Patient Patient Interpreter Needed:  None Criminal Activity/Legal Involvement Pertinent to Current Situation/Hospitalization:  No - Comment as needed Significant Relationships:  Adult Children, Siblings, Parents, Significant Other Lives with:  Parents Do you feel safe going back to the place where you live?  Yes Need for family participation in patient care:  Yes (Comment)  Care giving concerns:  None identified.    Social Worker assessment / plan:  Patient provided out patient substance abuse resources. Patient did not allow LCSW to schedule out patient appointment. He stated he would look over the information.  LCSW signing off.   Employment status:  Unemployed Forensic scientist:  Self Pay (Medicaid Pending) PT Recommendations:    Information / Referral to community resources:  Outpatient Substance Abuse Treatment Options  Patient/Family's Response to care:  Patient accepted information and indicated that he would review.   Patient/Family's Understanding of and Emotional Response to Diagnosis, Current Treatment, and Prognosis:  Patient minimized his alcohol use. Patient did understand his diagnosis, treatment and prognosis.   Emotional Assessment Appearance:  Appears older than stated age Attitude/Demeanor/Rapport:    Affect (typically observed):    Orientation:  Oriented to Self, Oriented to Place, Oriented to  Time, Oriented to Situation Alcohol / Substance use:  Alcohol Use Psych  involvement (Current and /or in the community):  No (Comment)  Discharge Needs  Concerns to be addressed:  Substance Abuse Concerns Readmission within the last 30 days:  No Current discharge risk:  Substance Abuse Barriers to Discharge:  No Barriers Identified   Ihor Gully, LCSW 12/17/2017, 12:30 PM

## 2017-12-17 NOTE — Progress Notes (Signed)
**Note De-identified Jameal Razzano Obfuscation** EKG complete and placed in patient chart 

## 2017-12-17 NOTE — Progress Notes (Signed)
Patient being discharge to home. IV access removed without incident. Dressed himself and waiting for prescription. Has all belonging with him.

## 2017-12-17 NOTE — Discharge Summary (Signed)
Physician Discharge Summary  Nicholas Avila ZOX:[id number] DOB: [birth year] DOA: 12/10/2017  PCP: Patient, No Pcp Per Cardiologist: Dr. Harl Bowie  Admit date: 12/10/2017 Discharge date: 12/17/2017  Admitted From: Home  Disposition: Home with medication assistance arranged prior to discharge  Recommendations for Outpatient Follow-up:  1. Follow up with cardiology in 3 weeks for cardioversion    Discharge Condition: STABLE   CODE STATUS: FULL    Brief Hospitalization Summary: Please see all hospital notes, images, labs for full details of the hospitalization.  Brief Narrative:  57 year old male admitted to the hospital with fever, cough, feeling generally unwell.  Found to have evidence of pulmonary infection/sepsis.  Also had new onset atrial flutter with rapid ventricular response.  Started on intravenous Cardizem as well as intravenous heparin.  Cardiology following.  Overall heart rates have improved.  Hospital course was complicated by development of alcohol withdrawal with delirium tremens.  He required precedex infusion, but has since improved and withdrawal has resolved.  He had some difficulty with rate control and was on IV cardizem several times.  He finally was controlled on toprol xl 100 mg BID and cardizem CD 240 mg daily.  He is on apixaban for anticoagulation.  He has been given assistance with medications prior to discharge from hospital.  He has been strongly advised and counseled to avoid alcohol consumption.    Assessment & Plan:   Principal Problem:   Tachycardia Active Problems:   Syncopal episodes   Abnormal EKG   Hyperglycemia   Sepsis (HCC)   Calcium blood decreased   Atrial fibrillation, transient (HCC)   Smoking   Alcohol withdrawal syndrome with complication, with unspecified complication ([place])   Delirium tremens ([place])   1. Sepsis.  Resolved now.  Possibly related to bronchitis.  No evidence of pneumonia on CT imaging.  Received IV antibiotics in the  emergency room, these were transitioned to oral Levaquin and he has completed his course of antibiotics.  Blood cultures show no growth.  Hemodynamics have stabilized after receiving IV fluids and lactic acid has trended down to normal.     2. Atrial flutter with RVR.  Heart rate has been difficult to control.  He was on cardizem infusion, which was weaned off.  He has been started on oral cardizem and oral metoprolol with suboptimal HR control.  Oral diltiazem increased to 240 mg today.  Echocardiogram does not show any acute abnormalities.  Anticoagulation has been transitioned to Eliquis.  He has been given assistance with his medications since he has no medical insurance.   3. Near syncope.  Patient reported a near syncopal episode prior to admission.  CT head was unremarkable.  Suspect is related to volume depletion and dehydration.  Symptoms have resolved. 4. Acute alcohol withdrawal with delirium tremens.  Patient reportedly drinks 3 to 4, 40 ounce beers a day.  He developed significant alcohol withdrawal.  He was initially treated with intravenous Ativan, but did not have any significant response.  He was subsequently transitioned to Precedex with improvement of his symptoms.  Overall, he has improved.  Precedex has been weaned off and mental status is back to baseline.  He has been strongly advised and counseled to avoid all alcohol consumption especially while on these medications and he verbalized understanding.   5. Acute bronchitis.  No evidence of pneumonia on CT imaging.  Continue supportive management.  Respiratory status is stable.  6. Pulmonary nodules.  Repeat CT chest in 3 to 6 months. 7. Tobacco use.  Counseled on the importance of tobacco cessation.  Pt verbalized understanding.    DVT prophylaxis: Lovenox Code Status: full code Family Communication: No family at bedside Disposition Plan: discharge home   Consultants:   cardiology  Procedures:  Echo: - Left ventricle: The  cavity size was normal. Wall thickness was normal. Systolic function was normal. The estimated ejection fraction was in the range of 50% to 55%. Wall motion was normal; there were no regional wall motion abnormalities. The study is not technically sufficient to allow evaluation of LV diastolic function. - Aortic valve: Mildly calcified annulus. Trileaflet; normal thickness leaflets. Valve area (VTI): 2.53 cm^2. Valve area (Vmax): 2.77 cm^2. - Mitral valve: There was mild regurgitation. - Left atrium: The atrium was mildly dilated. - Right ventricle: The cavity size was mildly to moderately dilated. Systolic function was mildly reduced. - Right atrium: The atrium was moderately dilated. - Tricuspid valve: There was moderate regurgitation. - Pulmonary arteries: Systolic pressure was mildly to moderately increased. PA peak pressure: 39 mm Hg (S).  - Technically adequate study.  Discharge Diagnoses:  Principal Problem:   Tachycardia Active Problems:   Syncopal episodes   Abnormal EKG   Hyperglycemia   Sepsis (HCC)   Calcium blood decreased   Atrial fibrillation, transient (HCC)   Smoking   Alcohol withdrawal syndrome with complication, with unspecified complication ([place])   Delirium tremens [facility])    Discharge Instructions: Discharge Instructions    Call MD for:  difficulty breathing, headache or visual disturbances   Complete by:  As directed    Call MD for:  extreme fatigue   Complete by:  As directed    Call MD for:  persistant dizziness or light-headedness   Complete by:  As directed    Diet - low sodium heart healthy   Complete by:  As directed    Increase activity slowly   Complete by:  As directed      Allergies as of 12/17/2017   No Known Allergies     Medication List    STOP taking these medications   naproxen sodium 220 MG tablet Commonly known as:  ALEVE     TAKE these medications   apixaban 5 MG Tabs tablet Commonly known as:   ELIQUIS Take 1 tablet (5 mg total) by mouth 2 (two) times daily.   diltiazem 240 MG 24 hr capsule Commonly known as:  CARDIZEM CD Take 1 capsule (240 mg total) by mouth daily. Start taking on:  0/06/7251   folic acid 1 MG tablet Commonly known as:  FOLVITE Take 1 tablet (1 mg total) by mouth daily. Start taking on:  12/18/2017   metoprolol succinate 100 MG 24 hr tablet Commonly known as:  TOPROL-XL Take 1 tablet (100 mg total) by mouth 2 (two) times daily. Take with or immediately following a meal.   multivitamin with minerals Tabs tablet Take 1 tablet by mouth daily. Start taking on:  12/18/2017   thiamine 100 MG tablet Take 1 tablet (100 mg total) by mouth daily. Start taking on:  12/18/2017      Follow-up Information    Branch, Alphonse Guild, MD. Schedule an appointment as soon as possible for a visit in 3 week(s).   Specialty:  Cardiology Why:  Hospital Follow Up  Contact information: [address] [place] [address] [phone]          No Known Allergies Allergies as of 12/17/2017   No Known Allergies     Medication List  STOP taking these medications   naproxen sodium 220 MG tablet Commonly known as:  ALEVE     TAKE these medications   apixaban 5 MG Tabs tablet Commonly known as:  ELIQUIS Take 1 tablet (5 mg total) by mouth 2 (two) times daily.   diltiazem 240 MG 24 hr capsule Commonly known as:  CARDIZEM CD Take 1 capsule (240 mg total) by mouth daily. Start taking on:  05/20/1094   folic acid 1 MG tablet Commonly known as:  FOLVITE Take 1 tablet (1 mg total) by mouth daily. Start taking on:  12/18/2017   metoprolol succinate 100 MG 24 hr tablet Commonly known as:  TOPROL-XL Take 1 tablet (100 mg total) by mouth 2 (two) times daily. Take with or immediately following a meal.   multivitamin with minerals Tabs tablet Take 1 tablet by mouth daily. Start taking on:  12/18/2017   thiamine 100 MG tablet Take 1 tablet (100 mg total) by mouth  daily. Start taking on:  12/18/2017       Procedures/Studies: Ct Head Wo Contrast  Result Date: 12/10/2017 CLINICAL DATA:  Head trauma. Syncopal episode yesterday with dizziness. Tachycardia. EXAM: CT HEAD WITHOUT CONTRAST TECHNIQUE: Contiguous axial images were obtained from the base of the skull through the vertex without intravenous contrast. COMPARISON:  None. FINDINGS: Brain: There is no evidence of acute infarct, intracranial hemorrhage, mass, midline shift, or extra-axial fluid collection. The ventricles and sulci are normal. Periventricular white matter hypodensities are nonspecific but compatible with mild chronic small vessel ischemic disease. Vascular: No hyperdense vessel. Skull: No fracture or focal osseous lesion. Sinuses/Orbits: Visualized paranasal sinuses and mastoid air cells are clear. Orbits are unremarkable. Other: None. IMPRESSION: 1. No evidence of acute intracranial abnormality. 2. Mild chronic small vessel ischemic disease. Electronically Signed   By: Logan Bores M.D.   On: 12/10/2017 07:26   Ct Angio Chest Pe W And/or Wo Contrast  Result Date: 12/10/2017 CLINICAL DATA:  57 year old male with fever, chills, productive cough. Tachycardia. Syncopal episode yesterday. EXAM: CT ANGIOGRAPHY CHEST WITH CONTRAST TECHNIQUE: Multidetector CT imaging of the chest was performed using the standard protocol during bolus administration of intravenous contrast. Multiplanar CT image reconstructions and MIPs were obtained to evaluate the vascular anatomy. CONTRAST:  149mL ISOVUE-370 IOPAMIDOL (ISOVUE-370) INJECTION 76% COMPARISON:  Chest radiographs 0514 hours today and earlier. FINDINGS: Cardiovascular: Good contrast bolus timing in the pulmonary arterial tree. No focal filling defect identified in the pulmonary arteries to suggest acute pulmonary embolism. Calcified coronary artery atherosclerosis (series 5, image 201). Mild cardiomegaly. No pericardial effusion. Negative visible aorta aside  from mild calcified plaque. Mediastinum/Nodes: Negative thoracic inlet. Negative mediastinum. No lymphadenopathy. Lungs/Pleura: The major airways are patent.  Centrilobular emphysema Mild dependent atelectasis in both lungs. Fairly numerous (roughly 21-25) scattered solid and sub solid pulmonary nodules in both lungs. Many are sub solid and irregular and either peribronchial or peripheral/subpleural. The largest nodules are 7-8 millimeters (right upper lobe series 6, image 79 and left lower lobe image 142). No cavitary nodules. No consolidation. No pleural effusion or other abnormal pulmonary opacity. Upper Abdomen: Negative visible liver, spleen, pancreas, adrenal glands, kidneys, and bowel in the left upper quadrant. Musculoskeletal: No acute osseous abnormality identified. Review of the MIP images confirms the above findings. IMPRESSION: 1. No evidence of acute pulmonary embolus. 2. Emphysema (ICD10-J43.9) with fairly numerous superimposed bilateral pulmonary solid and sub solid pulmonary nodules. These are nonspecific, but could be related to acute disseminated infection, or could be postinflammatory. None  are cavitary, and there are no areas of consolidation or confluent opacity. No pleural effusion. Non-contrast chest CT at 3-6 months is recommended. If the nodules are stable at time of repeat CT, then future CT at 18-24 months (from today's scan) is considered optional for low-risk patients, but is recommended for high-risk patients. This recommendation follows the consensus statement: Guidelines for Management of Incidental Pulmonary Nodules Detected on CT Images: From the Fleischner Society 2017; Radiology 2017; 284:228-243. 3. Mild cardiomegaly. Calcified coronary artery atherosclerosis. Electronically Signed   By: Genevie Ann M.D.   On: 12/10/2017 07:30   Dg Chest Port 1 View  Result Date: 12/10/2017 CLINICAL DATA:  Cough, chills and fever. EXAM: PORTABLE CHEST 1 VIEW COMPARISON:  Chest radiograph May 31, 2013 FINDINGS: Cardiac silhouette is upper limits of normal size. Calcified aortic arch. No pleural effusion or focal consolidation. No pneumothorax. Slightly asymmetrically dense LEFT mid lung zone favoring soft tissue attenuation without focal consolidation or pleural effusion. No pneumothorax. Soft tissue planes and included osseous structures are non suspicious. IMPRESSION: Asymmetric density LEFT mid lung zone favored as soft tissue attenuation though, recommend follow-up PA and lateral views of the chest when clinically able. Borderline cardiomegaly. Aortic Atherosclerosis (ICD10-I70.0). Electronically Signed   By: Elon Alas M.D.   On: 12/10/2017 05:53      Subjective: Pt says he feels better and he really wants to go home.  He says that he has no palpitations and will stop drinking alcohol.   Discharge Exam: Vitals:   12/17/17 0700 12/17/17 0800  BP: 115/88 118/84  Pulse: 74 96  Resp: 12 20  Temp:  98.3 F (36.8 C)  SpO2: 100% 100%   Vitals:   12/17/17 0532 12/17/17 0600 12/17/17 0700 12/17/17 0800  BP:  106/68 115/88 118/84  Pulse: 89 85 74 96  Resp: 16 20 12 20   Temp:    98.3 F (36.8 C)  TempSrc:    Oral  SpO2: 100% 100% 100% 100%  Weight:      Height:       General: Pt is alert, awake, not in acute distress Cardiovascular: irregular S1/S2 +, no rubs, no gallops Respiratory: CTA bilaterally, no wheezing, no rhonchi Abdominal: Soft, NT, ND, bowel sounds + Extremities: no edema, no cyanosis   The results of significant diagnostics from this hospitalization (including imaging, microbiology, ancillary and laboratory) are listed below for reference.     Microbiology: Recent Results (from the past 240 hour(s))  Blood Culture (routine x 2)     Status: None   Collection Time: 12/10/17  4:43 AM  Result Value Ref Range Status   Specimen Description BLOOD RIGHT ARM  Final   Special Requests   Final    BOTTLES DRAWN AEROBIC AND ANAEROBIC Blood Culture adequate  volume   Culture   Final    NO GROWTH 5 DAYS Performed at [facility], [address], [place], [place] [address]    Report Status 12/15/2017 FINAL  Final  Blood Culture (routine x 2)     Status: None   Collection Time: 12/10/17  4:48 AM  Result Value Ref Range Status   Specimen Description BLOOD LEFT ARM  Final   Special Requests   Final    BOTTLES DRAWN AEROBIC AND ANAEROBIC Blood Culture adequate volume   Culture   Final    NO GROWTH 5 DAYS Performed at [facility], [address], [place], [place] [address]    Report Status 12/15/2017 FINAL  Final  MRSA PCR Screening     Status: None   Collection Time: 12/10/17  2:30 PM  Result Value Ref Range Status   MRSA by PCR NEGATIVE NEGATIVE Final    Comment:        The GeneXpert MRSA Assay (FDA approved for NASAL specimens only), is one component of a comprehensive MRSA colonization surveillance program. It is not intended to diagnose MRSA infection nor to guide or monitor treatment for MRSA infections. Performed at [facility], [address], [place], [place] [address]      Labs: BNP (last 3 results) No results for input(s): BNP in the last 8760 hours. Basic Metabolic Panel: Recent Labs  Lab 12/11/17 0402 12/12/17 0524 12/13/17 0500 12/14/17 0403 12/17/17 0425  NA 138 138 139 138 139  K 3.6 5.3* 4.2 3.6 3.5  CL 104 107 109 108 103  CO2 25 24 23 23 27   GLUCOSE 102* 110* 106* 122* 133*  BUN <5* <5* <5* 5* 7  CREATININE 0.63 0.61 0.62 0.66 0.62  CALCIUM 7.8* 8.4* 8.7* 8.5* 9.0  MG  --  2.4  --   --  1.6*  PHOS  --   --   --   --  4.5   Liver Function Tests: Recent Labs  Lab 12/11/17 0402 12/12/17 0524 12/13/17 0500 12/14/17 0403 12/17/17 0425  AST 24 31 25  14* 14*  ALT 25 24 24 17 12   ALKPHOS 120 115 117 102 105  BILITOT 0.9 0.7 1.0 0.8 0.5  PROT 6.3* 6.4* 6.4* 6.0* 6.9  ALBUMIN 3.2* 3.3* 3.2* 2.8* 3.4*   No results for input(s): LIPASE, AMYLASE in the last 168 hours. No results for input(s): AMMONIA  in the last 168 hours. CBC: Recent Labs  Lab 12/11/17 0402 12/12/17 0524 12/13/17 0500 12/14/17 0403 12/17/17 0425  WBC 7.4 7.3 9.4 6.2 9.1  NEUTROABS  --   --   --   --  6.3  HGB 12.3* 11.7* 12.2* 11.8* 12.9*  HCT 37.6* 36.0* 37.3* 36.6* 39.2  MCV 94.5 96.8 96.1 95.6 94.9  PLT 242 203 197 203 282   Cardiac Enzymes: No results for input(s): CKTOTAL, CKMB, CKMBINDEX, TROPONINI in the last 168 hours. BNP: Invalid input(s): POCBNP CBG: No results for input(s): GLUCAP in the last 168 hours. D-Dimer No results for input(s): DDIMER in the last 72 hours. Hgb A1c No results for input(s): HGBA1C in the last 72 hours. Lipid Profile No results for input(s): CHOL, HDL, LDLCALC, TRIG, CHOLHDL, LDLDIRECT in the last 72 hours. Thyroid function studies No results for input(s): TSH, T4TOTAL, T3FREE, THYROIDAB in the last 72 hours.  Invalid input(s): FREET3 Anemia work up No results for input(s): VITAMINB12, FOLATE, FERRITIN, TIBC, IRON, RETICCTPCT in the last 72 hours. Urinalysis    Component Value Date/Time   COLORURINE STRAW (A) 12/10/2017 0443   APPEARANCEUR CLEAR 12/10/2017 0443   LABSPEC 1.005 12/10/2017 0443   PHURINE 7.0 12/10/2017 0443   GLUCOSEU 50 (A) 12/10/2017 0443   HGBUR SMALL (A) 12/10/2017 0443   BILIRUBINUR NEGATIVE 12/10/2017 0443   KETONESUR NEGATIVE 12/10/2017 0443   PROTEINUR 100 (A) 12/10/2017 0443   NITRITE NEGATIVE 12/10/2017 0443   LEUKOCYTESUR NEGATIVE 12/10/2017 0443   Sepsis Labs Invalid input(s): PROCALCITONIN,  WBC,  LACTICIDVEN Microbiology Recent Results (from the past 240 hour(s))  Blood Culture (routine x 2)     Status: None   Collection Time: 12/10/17  4:43 AM  Result Value Ref Range Status   Specimen Description BLOOD RIGHT ARM  Final   Special Requests   Final    BOTTLES DRAWN AEROBIC AND ANAEROBIC Blood Culture adequate volume   Culture   Final    NO GROWTH 5 DAYS Performed at [facility], [address], [place], [place] [address]     Report Status 12/15/2017 FINAL  Final  Blood Culture (routine x 2)     Status: None   Collection Time: 12/10/17  4:48 AM  Result Value Ref Range Status   Specimen Description BLOOD LEFT ARM  Final   Special Requests   Final    BOTTLES DRAWN AEROBIC AND ANAEROBIC Blood Culture adequate volume   Culture   Final    NO GROWTH 5 DAYS Performed at [facility], [address], [place], [place] [address]    Report Status 12/15/2017 FINAL  Final  MRSA PCR Screening     Status: None   Collection Time: 12/10/17  2:30 PM  Result Value Ref Range Status   MRSA by PCR NEGATIVE NEGATIVE Final    Comment:        The GeneXpert MRSA Assay (FDA approved for NASAL specimens only), is one component of a comprehensive MRSA colonization surveillance program. It is not intended to diagnose MRSA infection nor to guide or monitor treatment for MRSA infections. Performed at [facility], [address], [place], [place] [address]     Time coordinating discharge: 38 minutes  SIGNED:  Irwin Brakeman, MD  [facility] 12/17/2017, 10:26 AM Pager [phone]  If 7PM-7AM, please contact night-coverage [email or web address] Password [email or web address]

## 2017-12-17 NOTE — Discharge Instructions (Signed)
Please take your medications as prescribed and don't miss any doses.  Please avoid all alcohol consumption, especially while you are on these medications.   Please be sure to follow up with cardiology.  They will call you with your appointment information.   Seek medical care or return to ED if symptoms come back, worsen or new problem develops.  Please establish care with a primary care physician as soon as possible.     Follow with Primary MD  Patient, No Pcp Per  and other consultants as instructed your Hospitalist MD  Please get a complete blood count and chemistry panel checked by your Primary MD at your next visit, and again as instructed by your Primary MD.  Get Medicines reviewed and adjusted: Please take all your medications with you for your next visit with your Primary MD  Laboratory/radiological data: Please request your Primary MD to go over all hospital tests and procedure/radiological results at the follow up, please ask your Primary MD to get all Hospital records sent to his/her office.  In some cases, they will be blood work, cultures and biopsy results pending at the time of your discharge. Please request that your primary care M.D. follows up on these results.  Also Note the following: If you experience worsening of your admission symptoms, develop shortness of breath, life threatening emergency, suicidal or homicidal thoughts you must seek medical attention immediately by calling 911 or calling your MD immediately  if symptoms less severe.  You must read complete instructions/literature along with all the possible adverse reactions/side effects for all the Medicines you take and that have been prescribed to you. Take any new Medicines after you have completely understood and accpet all the possible adverse reactions/side effects.   Do not drive when taking Pain medications or sleeping medications (Benzodaizepines)  Do not take more than prescribed Pain, Sleep and Anxiety  Medications. It is not advisable to combine anxiety,sleep and pain medications without talking with your primary care practitioner  Special Instructions: If you have smoked or chewed Tobacco  in the last 2 yrs please stop smoking, stop any regular Alcohol  and or any Recreational drug use.  Wear Seat belts while driving.  Please note: You were cared for by a hospitalist during your hospital stay. Once you are discharged, your primary care physician will handle any further medical issues. Please note that NO REFILLS for any discharge medications will be authorized once you are discharged, as it is imperative that you return to your primary care physician (or establish a relationship with a primary care physician if you do not have one) for your post hospital discharge needs so that they can reassess your need for medications and monitor your lab values.     Information on my medicine - ELIQUIS (apixaban)  This medication education was reviewed with me or my healthcare representative as part of my discharge preparation.   Why was Eliquis prescribed for you? Eliquis was prescribed for you to reduce the risk of a blood clot forming that can cause a stroke if you have a medical condition called atrial fibrillation (a type of irregular heartbeat).  What do You need to know about Eliquis ? Take your Eliquis TWICE DAILY - one tablet in the morning and one tablet in the evening with or without food. If you have difficulty swallowing the tablet whole please discuss with your pharmacist how to take the medication safely.  Take Eliquis exactly as prescribed by your doctor and DO NOT  stop taking Eliquis without talking to the doctor who prescribed the medication.  Stopping may increase your risk of developing a stroke.  Refill your prescription before you run out.  After discharge, you should have regular check-up appointments with your healthcare provider that is prescribing your Eliquis.  In the  future your dose may need to be changed if your kidney function or weight changes by a significant amount or as you get older.  What do you do if you miss a dose? If you miss a dose, take it as soon as you remember on the same day and resume taking twice daily.  Do not take more than one dose of ELIQUIS at the same time to make up a missed dose.  Important Safety Information A possible side effect of Eliquis is bleeding. You should call your healthcare provider right away if you experience any of the following: ? Bleeding from an injury or your nose that does not stop. ? Unusual colored urine (red or dark brown) or unusual colored stools (red or black). ? Unusual bruising for unknown reasons. ? A serious fall or if you hit your head (even if there is no bleeding).  Some medicines may interact with Eliquis and might increase your risk of bleeding or clotting while on Eliquis. To help avoid this, consult your healthcare provider or pharmacist prior to using any new prescription or non-prescription medications, including herbals, vitamins, non-steroidal anti-inflammatory drugs (NSAIDs) and supplements.  This website has more information on Eliquis (apixaban): http://www.eliquis.com/eliquis/home   '

## 2017-12-19 DIAGNOSIS — Z0001 Encounter for general adult medical examination with abnormal findings: Secondary | ICD-10-CM | POA: Diagnosis not present

## 2017-12-19 DIAGNOSIS — F17200 Nicotine dependence, unspecified, uncomplicated: Secondary | ICD-10-CM | POA: Diagnosis not present

## 2017-12-19 DIAGNOSIS — I10 Essential (primary) hypertension: Secondary | ICD-10-CM | POA: Diagnosis not present

## 2017-12-19 DIAGNOSIS — I482 Chronic atrial fibrillation: Secondary | ICD-10-CM | POA: Diagnosis not present

## 2017-12-19 DIAGNOSIS — F17210 Nicotine dependence, cigarettes, uncomplicated: Secondary | ICD-10-CM | POA: Diagnosis not present

## 2017-12-19 DIAGNOSIS — E785 Hyperlipidemia, unspecified: Secondary | ICD-10-CM | POA: Diagnosis not present

## 2017-12-31 ENCOUNTER — Encounter: Payer: Self-pay | Admitting: Gastroenterology

## 2018-01-16 ENCOUNTER — Encounter: Payer: Self-pay | Admitting: Student

## 2018-01-16 ENCOUNTER — Ambulatory Visit (INDEPENDENT_AMBULATORY_CARE_PROVIDER_SITE_OTHER): Payer: BLUE CROSS/BLUE SHIELD | Admitting: Student

## 2018-01-16 VITALS — BP 126/74 | HR 68 | Ht 71.0 in | Wt 145.0 lb

## 2018-01-16 DIAGNOSIS — Z7901 Long term (current) use of anticoagulants: Secondary | ICD-10-CM

## 2018-01-16 DIAGNOSIS — Z7289 Other problems related to lifestyle: Secondary | ICD-10-CM

## 2018-01-16 DIAGNOSIS — I4892 Unspecified atrial flutter: Secondary | ICD-10-CM | POA: Diagnosis not present

## 2018-01-16 DIAGNOSIS — Z789 Other specified health status: Secondary | ICD-10-CM

## 2018-01-16 DIAGNOSIS — Z720 Tobacco use: Secondary | ICD-10-CM | POA: Diagnosis not present

## 2018-01-16 MED ORDER — APIXABAN 5 MG PO TABS: 5.0000 mg | ORAL_TABLET | Freq: Two times a day (BID) | ORAL | 3 refills | Status: DC

## 2018-01-16 MED ORDER — DILTIAZEM HCL ER COATED BEADS 240 MG PO CP24: 240.0000 mg | ORAL_CAPSULE | Freq: Every day | ORAL | 3 refills | Status: DC

## 2018-01-16 MED ORDER — METOPROLOL SUCCINATE ER 100 MG PO TB24: 100.0000 mg | ORAL_TABLET | Freq: Two times a day (BID) | ORAL | 3 refills | Status: DC

## 2018-01-16 NOTE — Progress Notes (Signed)
Cardiology Office Note    Date:  01/16/2018   ID:  [name], DOB 1960/08/01, MRN [id number]  PCP:  Rosita Fire, MD  Cardiologist: Carlyle Dolly, MD    Chief Complaint  Patient presents with  . Hospitalization Follow-up    History of Present Illness:    Nicholas Avila is a 57 y.o. male with past medical history of tobacco use and alcohol use who presents to the office today for hospital follow-up.   He was recently admitted to [facility] on 12/10/2017 for evaluation of fever, chills, and a productive cough, found to have sepsis secondary to presumed bronchitis with Lactic Acid elevated to 5.22 on admission. Cardiology was consulted as he was in atrial flutter with RVR. He was initially started on IV Cardizem and this was transitioned to Cardizem CD 240mg  daily and Toprol-XL 100mg  BID at the time of discharge. Although his CHA2DS2-VASc Score was 0, he was started on Eliquis 5mg  BID in case outpatient DCCV needed to be pursued. His hospitalization was also complicated by alcohol withdrawal with delirium tremens and he did require a Precedex drip. The importance of alcohol cessation was reviewed with the patient multiple times during his hospitalization.   In talking with the patient today, he reports overall doing well since his recent hospitalization. He does experience intermittent episodes of dizziness but says breathing has been at baseline and denies any recurrent palpitations. No recent chest pain, orthopnea, PND, or lower extremity edema.  He does report having missed 2 doses of Eliquis approximately 2 weeks ago due to his work schedule but has been compliant with this since. He denies any evidence of melena, hematochezia, or hematuria. He wishes to stop his current medication regimen as soon as possible as he does not like taking scheduled medications.   He denies any recurrent alcohol use since hospital discharge. Does continue to smoke but has reduced this to 0.5 ppd.     Past Medical History:  Diagnosis Date  . Alcohol use   . Atrial flutter ([place])    a. diagnosed in 11/2017 during admission for Sepsis  . Tobacco use     History reviewed. No pertinent surgical history.  Current Medications: Outpatient Medications Prior to Visit  Medication Sig Dispense Refill  . folic acid (FOLVITE) 1 MG tablet Take 1 tablet (1 mg total) by mouth daily. 30 tablet 0  . Multiple Vitamin (MULTIVITAMIN WITH MINERALS) TABS tablet Take 1 tablet by mouth daily. 30 tablet 0  . thiamine 100 MG tablet Take 1 tablet (100 mg total) by mouth daily. 30 tablet 0  . apixaban (ELIQUIS) 5 MG TABS tablet Take 1 tablet (5 mg total) by mouth 2 (two) times daily. 60 tablet 1  . diltiazem (CARDIZEM CD) 240 MG 24 hr capsule Take 1 capsule (240 mg total) by mouth daily. 30 capsule 0  . metoprolol succinate (TOPROL-XL) 100 MG 24 hr tablet Take 1 tablet (100 mg total) by mouth 2 (two) times daily. Take with or immediately following a meal. 60 tablet 0   No facility-administered medications prior to visit.      Allergies:   Patient has no known allergies.   Social History   Socioeconomic History  . Marital status: Single    Spouse name: Not on file  . Number of children: Not on file  . Years of education: Not on file  . Highest education level: Not on file  Occupational History  . Not on file  Social Needs  .  Financial resource strain: Not on file  . Food insecurity:    Worry: Patient refused    Inability: Patient refused  . Transportation needs:    Medical: Patient refused    Non-medical: Patient refused  Tobacco Use  . Smoking status: Current Every Day Smoker    Packs/day: 0.50    Types: Cigarettes  . Smokeless tobacco: Never Used  Substance and Sexual Activity  . Alcohol use: Yes    Comment: beer every day  . Drug use: Yes    Types: Marijuana  . Sexual activity: Not Currently  Lifestyle  . Physical activity:    Days per week: Patient refused    Minutes per  session: Patient refused  . Stress: Not at all  Relationships  . Social connections:    Talks on phone: Patient refused    Gets together: Patient refused    Attends religious service: Patient refused    Active member of club or organization: Patient refused    Attends meetings of clubs or organizations: Patient refused    Relationship status: Patient refused  Other Topics Concern  . Not on file  Social History Narrative  . Not on file     Family History:  The patient's family history includes Colon cancer in his father; Heart disease in his maternal aunt.   Review of Systems:   Please see the history of present illness.     General:  No chills, fever, night sweats or weight changes.  Cardiovascular:  No chest pain, dyspnea on exertion, edema, orthopnea, palpitations, paroxysmal nocturnal dyspnea. Dermatological: No rash, lesions/masses Respiratory: No cough, dyspnea Urologic: No hematuria, dysuria Abdominal:   No nausea, vomiting, diarrhea, bright red blood per rectum, melena, or hematemesis Neurologic:  No visual changes, wkns, changes in mental status. Positive for dizziness.   All other systems reviewed and are otherwise negative except as noted above.   Physical Exam:    VS:  BP 126/74 (BP Location: Left Arm)   Pulse 68   Ht 5\' 11"  (1.803 m)   Wt 145 lb (65.8 kg)   SpO2 91%   BMI 20.22 kg/m    General: Well developed, well nourished [place] American male appearing in no acute distress. Head: Normocephalic, atraumatic, sclera non-icteric, no xanthomas, nares are without discharge.  Neck: No carotid bruits. JVD not elevated.  Lungs: Respirations regular and unlabored, without wheezes or rales.  Heart: Irregularly irregular. No S3 or S4.  No murmur, no rubs, or gallops appreciated. Abdomen: Soft, non-tender, non-distended with normoactive bowel sounds. No hepatomegaly. No rebound/guarding. No obvious abdominal masses. Msk:  Strength and tone appear normal for age. No  joint deformities or effusions. Extremities: No clubbing or cyanosis. No lower extremity edema.  Distal pedal pulses are 2+ bilaterally. Neuro: Alert and oriented X 3. Moves all extremities spontaneously. No focal deficits noted. Psych:  Responds to questions appropriately with a normal affect. Skin: No rashes or lesions noted  Wt Readings from Last 3 Encounters:  01/16/18 145 lb (65.8 kg)  12/17/17 131 lb 6.3 oz (59.6 kg)  05/31/13 145 lb (65.8 kg)     Studies/Labs Reviewed:   EKG:  EKG is ordered today.  The ekg ordered today demonstrates rate-controlled atrial flutter, HR 88.  Recent Labs: 12/10/2017: TSH 2.081 12/17/2017: ALT 12; BUN 7; Creatinine, Ser 0.62; Hemoglobin 12.9; Magnesium 1.6; Platelets 282; Potassium 3.5; Sodium 139   Lipid Panel No results found for: CHOL, TRIG, HDL, CHOLHDL, VLDL, LDLCALC, LDLDIRECT  Additional studies/ records that were  reviewed today include:   Echocardiogram: 12/11/2017 Study Conclusions  - Left ventricle: The cavity size was normal. Wall thickness was   normal. Systolic function was normal. The estimated ejection   fraction was in the range of 50% to 55%. Wall motion was normal;   there were no regional wall motion abnormalities. The study is   not technically sufficient to allow evaluation of LV diastolic   function. - Aortic valve: Mildly calcified annulus. Trileaflet; normal   thickness leaflets. Valve area (VTI): 2.53 cm^2. Valve area   (Vmax): 2.77 cm^2. - Mitral valve: There was mild regurgitation. - Left atrium: The atrium was mildly dilated. - Right ventricle: The cavity size was mildly to moderately   dilated. Systolic function was mildly reduced. - Right atrium: The atrium was moderately dilated. - Tricuspid valve: There was moderate regurgitation. - Pulmonary arteries: Systolic pressure was mildly to moderately   increased. PA peak pressure: 39 mm Hg (S). - Technically adequate study.  Assessment:    1. Atrial flutter,  unspecified type ([place])   2. Current use of long term anticoagulation   3. Tobacco use   4. Alcohol use      Plan:   In order of problems listed above:  1. Atrial Flutter with RVR/ Use of Long-Term Anticoagulation - His arrhythmia was recently diagnosed during admission for sepsis secondary to presumed bronchitis and alcohol withdrawal. He reports intermittent episodes of dizziness but denies any recurrent dyspnea and he has not experienced any palpitations since onset. His heart rate is overall well controlled in the 60's to 80's during today's visit. I am concerned about his compliance with medications long-term as he voices multiple times that he does not wish to remain on medications for an extended period of time. We spent a significant portion of today's visit reviewing options including rate control versus consideration of a DCCV. Risks and benefits of a cardioversion were reviewed and he agrees to proceed with the procedure as recommended by Dr. Harl Bowie during his hospitalization. He did miss 2 doses of Eliquis approximately 2 weeks ago but has been compliant since. Will schedule his cardioversion for later next week once he has been on this uninterrupted for a full 3 weeks. He was advised to call our office if he misses any doses in the interim. - Will continue on Cardizem CD 240mg  daily and Toprol-XL 100mg  BID for rate control along with Eliquis for anticoagulation. Can hopefully stop Eliquis once 30 days out from DCCV if no recurrence of his arrhythmia.   2. Tobacco Use - Continues to smoke 0.5 packs/day. Cessation was advised.  3. Alcohol Use - He has quit consuming alcohol since his recent hospitalization. Was congratulated on this with continued cessation advised.   Medication Adjustments/Labs and Tests Ordered: Current medicines are reviewed at length with the patient today.  Concerns regarding medicines are outlined above.  Medication changes, Labs and Tests ordered today are  listed in the Patient Instructions below. Patient Instructions  Medication Instructions:  Your physician recommends that you continue on your current medications as directed. Please refer to the Current Medication list given to you today.    Labwork: NONE  Testing/Procedures: Your physician has recommended that you have a Cardioversion (DCCV). Electrical Cardioversion uses a jolt of electricity to your heart either through paddles or wired patches attached to your chest. This is a controlled, usually prescheduled, procedure. Defibrillation is done under light anesthesia in the hospital, and you usually go home the day of the procedure.  This is done to get your heart back into a normal rhythm. You are not awake for the procedure. Please see the instruction sheet given to you today.    Follow-Up: Your physician recommends that you schedule a follow-up appointment in:  4 WEEKS POST CARDIOVERSION    Any Other Special Instructions Will Be Listed Below (If Applicable).  DO NOT TAKE THE TOPROL OR DILTIAZEM THE MORNING OF PROCEDURE   Electrical Cardioversion, Care After This sheet gives you information about how to care for yourself after your procedure. Your health care provider may also give you more specific instructions. If you have problems or questions, contact your health care provider. What can I expect after the procedure? After the procedure, it is common to have:  Some redness on the skin where the shocks were given.  Follow these instructions at home:  Do not drive for 24 hours if you were given a medicine to help you relax (sedative).  Take over-the-counter and prescription medicines only as told by your health care provider.  Ask your health care provider how to check your pulse. Check it often.  Rest for 48 hours after the procedure or as told by your health care provider.  Avoid or limit your caffeine use as told by your health care provider. Contact a health care  provider if:  You feel like your heart is beating too quickly or your pulse is not regular.  You have a serious muscle cramp that does not go away. Get help right away if:  You have discomfort in your chest.  You are dizzy or you feel faint.  You have trouble breathing or you are short of breath.  Your speech is slurred.  You have trouble moving an arm or leg on one side of your body.  Your fingers or toes turn cold or blue. This information is not intended to replace advice given to you by your health care provider. Make sure you discuss any questions you have with your health care provider. Document Released: 02/19/2013 Document Revised: 12/03/2015 Document Reviewed: 11/05/2015 Elsevier Interactive Patient Education  [employer].       If you need a refill on your cardiac medications before your next appointment, please call your pharmacy.      Signed, Erma Heritage, PA-C  01/16/2018 5:28 PM    [place] [address] [place], [place] [address] Phone: [phone]

## 2018-01-16 NOTE — Patient Instructions (Addendum)
Medication Instructions:  Your physician recommends that you continue on your current medications as directed. Please refer to the Current Medication list given to you today.    Labwork: NONE  Testing/Procedures: Your physician has recommended that you have a Cardioversion (DCCV). Electrical Cardioversion uses a jolt of electricity to your heart either through paddles or wired patches attached to your chest. This is a controlled, usually prescheduled, procedure. Defibrillation is done under light anesthesia in the hospital, and you usually go home the day of the procedure. This is done to get your heart back into a normal rhythm. You are not awake for the procedure. Please see the instruction sheet given to you today.    Follow-Up: Your physician recommends that you schedule a follow-up appointment in:  4 WEEKS POST CARDIOVERSION    Any Other Special Instructions Will Be Listed Below (If Applicable).  DO NOT TAKE THE TOPROL OR DILTIAZEM THE MORNING OF PROCEDURE   Electrical Cardioversion, Care After This sheet gives you information about how to care for yourself after your procedure. Your health care provider may also give you more specific instructions. If you have problems or questions, contact your health care provider. What can I expect after the procedure? After the procedure, it is common to have:  Some redness on the skin where the shocks were given.  Follow these instructions at home:  Do not drive for 24 hours if you were given a medicine to help you relax (sedative).  Take over-the-counter and prescription medicines only as told by your health care provider.  Ask your health care provider how to check your pulse. Check it often.  Rest for 48 hours after the procedure or as told by your health care provider.  Avoid or limit your caffeine use as told by your health care provider. Contact a health care provider if:  You feel like your heart is beating too quickly or  your pulse is not regular.  You have a serious muscle cramp that does not go away. Get help right away if:  You have discomfort in your chest.  You are dizzy or you feel faint.  You have trouble breathing or you are short of breath.  Your speech is slurred.  You have trouble moving an arm or leg on one side of your body.  Your fingers or toes turn cold or blue. This information is not intended to replace advice given to you by your health care provider. Make sure you discuss any questions you have with your health care provider. Document Released: 02/19/2013 Document Revised: 12/03/2015 Document Reviewed: 11/05/2015 Elsevier Interactive Patient Education  [employer].       If you need a refill on your cardiac medications before your next appointment, please call your pharmacy.

## 2018-01-17 ENCOUNTER — Other Ambulatory Visit: Payer: Self-pay | Admitting: Student

## 2018-01-17 MED ORDER — FOLIC ACID 1 MG PO TABS: 1.0000 mg | ORAL_TABLET | Freq: Every day | ORAL | 3 refills | Status: AC

## 2018-01-17 NOTE — Telephone Encounter (Signed)
refilled 

## 2018-01-17 NOTE — Telephone Encounter (Signed)
Patient is requesting that Folic Acid be sent in to [facility] in [place] / tg

## 2018-01-21 NOTE — Patient Instructions (Signed)
Your procedure is scheduled on: 01/24/2018  Report to [facility] at   7:30     AM.  Call this number if you have problems the morning of surgery: [phone]   Remember:   Do not drink or eat food:After Midnight.  :  Take these medicines the morning of surgery with A SIP OF WATER: Cardizem and Metoprolol   Do not wear jewelry, make-up or nail polish.  Do not wear lotions, powders, or perfumes. You may wear deodorant.  Do not shave 48 hours prior to surgery. Men may shave face and neck.  Do not bring valuables to the hospital.  Contacts, dentures or bridgework may not be worn into surgery.  Leave suitcase in the car. After surgery it may be brought to your room.  For patients admitted to the hospital, checkout time is 11:00 AM the day of discharge.   Patients discharged the day of surgery will not be allowed to drive home.     Electrical Cardioversion Electrical cardioversion is the delivery of a jolt of electricity to restore a normal rhythm to the heart. A rhythm that is too fast or is not regular keeps the heart from pumping well. In this procedure, sticky patches or metal paddles are placed on the chest to deliver electricity to the heart from a device. This procedure may be done in an emergency if:  There is low or no blood pressure as a result of the heart rhythm.  Normal rhythm must be restored as fast as possible to protect the brain and heart from further damage.  It may save a life.  This procedure may also be done for irregular or fast heart rhythms that are not immediately life-threatening. Tell a health care provider about:  Any allergies you have.  All medicines you are taking, including vitamins, herbs, eye drops, creams, and over-the-counter medicines.  Any problems you or family members have had with anesthetic medicines.  Any blood disorders you have.  Any surgeries you have had.  Any medical conditions you have.  Whether you are pregnant or may be  pregnant. What are the risks? Generally, this is a safe procedure. However, problems may occur, including:  Allergic reactions to medicines.  A blood clot that breaks free and travels to other parts of your body.  The possible return of an abnormal heart rhythm within hours or days after the procedure.  Your heart stopping (cardiac arrest). This is rare.  What happens before the procedure? Medicines  Your health care provider may have you start taking: ? Blood-thinning medicines (anticoagulants) so your blood does not clot as easily. ? Medicines may be given to help stabilize your heart rate and rhythm.  Ask your health care provider about changing or stopping your regular medicines. This is especially important if you are taking diabetes medicines or blood thinners. General instructions  Plan to have someone take you home from the hospital or clinic.  If you will be going home right after the procedure, plan to have someone with you for 24 hours.  Follow instructions from your health care provider about eating or drinking restrictions. What happens during the procedure?  To lower your risk of infection: ? Your health care team will wash or sanitize their hands. ? Your skin will be washed with soap.  An IV tube will be inserted into one of your veins.  You will be given a medicine to help you relax (sedative).  Sticky patches (electrodes) or metal paddles may  be placed on your chest.  An electrical shock will be delivered. The procedure may vary among health care providers and hospitals. What happens after the procedure?  Your blood pressure, heart rate, breathing rate, and blood oxygen level will be monitored until the medicines you were given have worn off.  Do not drive for 24 hours if you were given a sedative.  Your heart rhythm will be watched to make sure it does not change. This information is not intended to replace advice given to you by your health care  provider. Make sure you discuss any questions you have with your health care provider. Document Released: 04/21/2002 Document Revised: 12/29/2015 Document Reviewed: 11/05/2015 Elsevier Interactive Patient Education  2017 [employer].  Electrical Cardioversion, Care After This sheet gives you information about how to care for yourself after your procedure. Your health care provider may also give you more specific instructions. If you have problems or questions, contact your health care provider. What can I expect after the procedure? After the procedure, it is common to have:  Some redness on the skin where the shocks were given.  Follow these instructions at home:  Do not drive for 24 hours if you were given a medicine to help you relax (sedative).  Take over-the-counter and prescription medicines only as told by your health care provider.  Ask your health care provider how to check your pulse. Check it often.  Rest for 48 hours after the procedure or as told by your health care provider.  Avoid or limit your caffeine use as told by your health care provider. Contact a health care provider if:  You feel like your heart is beating too quickly or your pulse is not regular.  You have a serious muscle cramp that does not go away. Get help right away if:  You have discomfort in your chest.  You are dizzy or you feel faint.  You have trouble breathing or you are short of breath.  Your speech is slurred.  You have trouble moving an arm or leg on one side of your body.  Your fingers or toes turn cold or blue. This information is not intended to replace advice given to you by your health care provider. Make sure you discuss any questions you have with your health care provider. Document Released: 02/19/2013 Document Revised: 12/03/2015 Document Reviewed: 11/05/2015 Elsevier Interactive Patient Education  [employer].

## 2018-01-22 ENCOUNTER — Encounter (HOSPITAL_COMMUNITY): Payer: Self-pay

## 2018-01-22 ENCOUNTER — Encounter (HOSPITAL_COMMUNITY)
Admission: RE | Admit: 2018-01-22 | Discharge: 2018-01-22 | Disposition: A | Discharge status: Home / Self Care | Payer: BLUE CROSS/BLUE SHIELD | Source: Ambulatory Visit | Attending: Cardiology | Admitting: Cardiology

## 2018-01-22 ENCOUNTER — Other Ambulatory Visit: Payer: Self-pay

## 2018-01-22 DIAGNOSIS — Z01812 Encounter for preprocedural laboratory examination: Secondary | ICD-10-CM | POA: Diagnosis not present

## 2018-01-22 HISTORY — DX: Cardiac arrhythmia, unspecified: I49.9

## 2018-01-22 LAB — BASIC METABOLIC PANEL
ANION GAP: 7 (ref 5–15)
BUN: 9 mg/dL (ref 6–20)
CO2: 29 mmol/L (ref 22–32)
Calcium: 9.2 mg/dL (ref 8.9–10.3)
Chloride: 106 mmol/L (ref 98–111)
Creatinine, Ser: 0.82 mg/dL (ref 0.61–1.24)
GFR calc Af Amer: >60 mL/min (ref >60)
GFR calc non Af Amer: >60 mL/min (ref >60)
GLUCOSE: 98 mg/dL (ref 70–99)
POTASSIUM: 3.6 mmol/L (ref 3.5–5.1)
Sodium: 142 mmol/L (ref 135–145)

## 2018-01-23 ENCOUNTER — Telehealth: Payer: Self-pay

## 2018-01-23 NOTE — Telephone Encounter (Signed)
-----   Message from Arnoldo Lenis, MD sent at 01/23/2018 11:32 AM EDT ----- Labs look good  Zandra Abts MD

## 2018-01-23 NOTE — Telephone Encounter (Signed)
Spoke with pt. Advised him labs were normal.

## 2018-01-24 ENCOUNTER — Ambulatory Visit (HOSPITAL_COMMUNITY)
Admission: RE | Admit: 2018-01-24 | Discharge: 2018-01-24 | Disposition: A | Discharge status: Home / Self Care | Payer: BLUE CROSS/BLUE SHIELD | Source: Ambulatory Visit | Attending: Cardiology | Admitting: Cardiology

## 2018-01-24 ENCOUNTER — Ambulatory Visit (HOSPITAL_COMMUNITY): Payer: BLUE CROSS/BLUE SHIELD | Admitting: Anesthesiology

## 2018-01-24 ENCOUNTER — Other Ambulatory Visit: Payer: Self-pay

## 2018-01-24 ENCOUNTER — Encounter (HOSPITAL_COMMUNITY)
Admission: RE | Disposition: A | Discharge status: Home / Self Care | Payer: Self-pay | Source: Ambulatory Visit | Attending: Cardiology

## 2018-01-24 ENCOUNTER — Encounter (HOSPITAL_COMMUNITY): Payer: Self-pay | Admitting: *Deleted

## 2018-01-24 DIAGNOSIS — I4892 Unspecified atrial flutter: Secondary | ICD-10-CM | POA: Insufficient documentation

## 2018-01-24 DIAGNOSIS — F17210 Nicotine dependence, cigarettes, uncomplicated: Secondary | ICD-10-CM | POA: Diagnosis not present

## 2018-01-24 DIAGNOSIS — I4891 Unspecified atrial fibrillation: Secondary | ICD-10-CM | POA: Diagnosis not present

## 2018-01-24 DIAGNOSIS — Z7901 Long term (current) use of anticoagulants: Secondary | ICD-10-CM | POA: Insufficient documentation

## 2018-01-24 DIAGNOSIS — Z79899 Other long term (current) drug therapy: Secondary | ICD-10-CM | POA: Insufficient documentation

## 2018-01-24 DIAGNOSIS — Z8249 Family history of ischemic heart disease and other diseases of the circulatory system: Secondary | ICD-10-CM | POA: Diagnosis not present

## 2018-01-24 DIAGNOSIS — Z800 Family history of malignant neoplasm of digestive organs: Secondary | ICD-10-CM | POA: Diagnosis not present

## 2018-01-24 HISTORY — PX: CARDIOVERSION: SHX1299

## 2018-01-24 SURGERY — CARDIOVERSION
Anesthesia: General

## 2018-01-24 MED ORDER — SUCCINYLCHOLINE CHLORIDE 20 MG/ML IJ SOLN
INTRAMUSCULAR | Status: AC
  Filled 2018-01-24: qty 1

## 2018-01-24 MED ORDER — METOPROLOL SUCCINATE ER 50 MG PO TB24: 50.0000 mg | ORAL_TABLET | Freq: Two times a day (BID) | ORAL | 3 refills | Status: DC

## 2018-01-24 MED ORDER — LACTATED RINGERS IV SOLN
INTRAVENOUS | Status: DC
  Administered 2018-01-24: 1000 mL via INTRAVENOUS

## 2018-01-24 MED ORDER — SODIUM CHLORIDE 0.9 % IJ SOLN
INTRAMUSCULAR | Status: AC
  Filled 2018-01-24: qty 10

## 2018-01-24 MED ORDER — PROPOFOL 500 MG/50ML IV EMUL
INTRAVENOUS | Status: DC | PRN
  Administered 2018-01-24: 150 ug/kg/min via INTRAVENOUS

## 2018-01-24 MED ORDER — FENTANYL CITRATE (PF) 100 MCG/2ML IJ SOLN: 25.0000 ug | INTRAMUSCULAR | Status: DC | PRN

## 2018-01-24 MED ORDER — PROPOFOL 10 MG/ML IV BOLUS
INTRAVENOUS | Status: AC
  Filled 2018-01-24: qty 40

## 2018-01-24 MED ORDER — EPHEDRINE SULFATE 50 MG/ML IJ SOLN
INTRAMUSCULAR | Status: AC
  Filled 2018-01-24: qty 1

## 2018-01-24 MED ORDER — PROPOFOL 10 MG/ML IV BOLUS
INTRAVENOUS | Status: DC | PRN
  Administered 2018-01-24: 40 mg via INTRAVENOUS

## 2018-01-24 MED ORDER — HYDROCODONE-ACETAMINOPHEN 7.5-325 MG PO TABS: 1.0000 | ORAL_TABLET | Freq: Once | ORAL | Status: DC | PRN

## 2018-01-24 NOTE — Anesthesia Postprocedure Evaluation (Signed)
Anesthesia Post Note  Patient: Nicholas Avila  Procedure(s) Performed: CARDIOVERSION (N/A )  Patient location during evaluation: PACU Anesthesia Type: General Level of consciousness: awake and alert and oriented Pain management: pain level controlled Vital Signs Assessment: post-procedure vital signs reviewed and stable Respiratory status: spontaneous breathing Cardiovascular status: stable Postop Assessment: no apparent nausea or vomiting     Last Vitals:  Vitals:   01/24/18 0803  BP: 103/73  Pulse: (!) 126  Resp: 20  Temp: 36.7 C  SpO2: 100%    Last Pain:  Vitals:   01/24/18 0803  TempSrc: Oral  PainSc: 0-No pain                 ADAMS, AMY A

## 2018-01-24 NOTE — Transfer of Care (Addendum)
Immediate Anesthesia Transfer of Care Note  Patient: Nicholas Avila  Procedure(s) Performed: CARDIOVERSION (N/A )  Patient Location: PACU  Anesthesia Type:General  Level of Consciousness: awake, alert , oriented and patient cooperative  Airway & Oxygen Therapy: Patient Spontanous Breathing  Post-op Assessment: Report given to RN and Post -op Vital signs reviewed and stable  Post vital signs: Reviewed and stable  Last Vitals:  Vitals Value Taken Time  BP    Temp    Pulse    Resp    SpO2      Last Pain:  Vitals:   01/24/18 0803  TempSrc: Oral  PainSc: 0-No pain      Patients Stated Pain Goal: 5 (50/38/88 2800)  Complications: No apparent anesthesia complications

## 2018-01-24 NOTE — Progress Notes (Signed)
Needs follow CT scan 3-6 months based on pulmonary nodules noted during CT during from last admission   Zandra Abts MD

## 2018-01-24 NOTE — Anesthesia Procedure Notes (Signed)
Procedure Name: General with mask airway Date/Time: 01/24/2018 9:01 AM Performed by: Andree Elk Tommie Dejoseph A, CRNA Pre-anesthesia Checklist: Patient identified, Emergency Drugs available, Patient being monitored, Timeout performed and Suction available Oxygen Delivery Method: Simple face mask

## 2018-01-24 NOTE — Discharge Instructions (Signed)
Monitored Anesthesia Care, Care After °These instructions provide you with information about caring for yourself after your procedure. Your health care provider may also give you more specific instructions. Your treatment has been planned according to current medical practices, but problems sometimes occur. Call your health care provider if you have any problems or questions after your procedure. °What can I expect after the procedure? °After your procedure, it is common to: °· Feel sleepy for several hours. °· Feel clumsy and have poor balance for several hours. °· Feel forgetful about what happened after the procedure. °· Have poor judgment for several hours. °· Feel nauseous or vomit. °· Have a sore throat if you had a breathing tube during the procedure. ° °Follow these instructions at home: °For at least 24 hours after the procedure: ° °· Do not: °? Participate in activities in which you could fall or become injured. °? Drive. °? Use heavy machinery. °? Drink alcohol. °? Take sleeping pills or medicines that cause drowsiness. °? Make important decisions or sign legal documents. °? Take care of children on your own. °· Rest. °Eating and drinking °· Follow the diet that is recommended by your health care provider. °· If you vomit, drink water, juice, or soup when you can drink without vomiting. °· Make sure you have little or no nausea before eating solid foods. °General instructions °· Have a responsible adult stay with you until you are awake and alert. °· Take over-the-counter and prescription medicines only as told by your health care provider. °· If you smoke, do not smoke without supervision. °· Keep all follow-up visits as told by your health care provider. This is important. °Contact a health care provider if: °· You keep feeling nauseous or you keep vomiting. °· You feel light-headed. °· You develop a rash. °· You have a fever. °Get help right away if: °· You have trouble breathing. °This information is  not intended to replace advice given to you by your health care provider. Make sure you discuss any questions you have with your health care provider. °Document Released: 08/22/2015 Document Revised: 12/22/2015 Document Reviewed: 08/22/2015 °Elsevier Interactive Patient Education © 2018 [employer] °Electrical Cardioversion, Care After °This sheet gives you information about how to care for yourself after your procedure. Your health care provider may also give you more specific instructions. If you have problems or questions, contact your health care provider. °What can I expect after the procedure? °After the procedure, it is common to have: °· Some redness on the skin where the shocks were given. ° °Follow these instructions at home: °· Do not drive for 24 hours if you were given a medicine to help you relax (sedative). °· Take over-the-counter and prescription medicines only as told by your health care provider. °· Ask your health care provider how to check your pulse. Check it often. °· Rest for 48 hours after the procedure or as told by your health care provider. °· Avoid or limit your caffeine use as told by your health care provider. °Contact a health care provider if: °· You feel like your heart is beating too quickly or your pulse is not regular. °· You have a serious muscle cramp that does not go away. °Get help right away if: °· You have discomfort in your chest. °· You are dizzy or you feel faint. °· You have trouble breathing or you are short of breath. °· Your speech is slurred. °· You have trouble moving an arm or leg on   one side of your body. °· Your fingers or toes turn cold or blue. °This information is not intended to replace advice given to you by your health care provider. Make sure you discuss any questions you have with your health care provider. °Document Released: 02/19/2013 Document Revised: 12/03/2015 Document Reviewed: 11/05/2015 °Elsevier Interactive Patient Education © 2018 [employer]  Inc[employer] ° °

## 2018-01-24 NOTE — CV Procedure (Addendum)
Procedure: Electrical cardioversion Physician: Dr Carlyle Dolly MD Indication: atrial flutter   Patient presented for electrical cardioversion for aflutter. He was brought to the procedure suite after appropriate consent was obtained. A defib pad was placed in the anterior and posterior positions. Sedation was achieved with the assistance of anesthesiology, for full details please refer to there note. He was succesfully cardioverted from aflutter with RVR to normal sinus rhythm rate of 70 with a single synchronized 200 j shock. Cardiopulmonary monitoring was performed throughout the procedure, he tolerated well without complications.  We will stop his his diltiazem, lower his toprol to 50mg  bid. He already has f/u arranged 02/26/18   Carlyle Dolly MD

## 2018-01-24 NOTE — Progress Notes (Signed)
Electrical Cardioversion Procedure Note Nicholas Avila [id number] 19-Sep-1960  Procedure: Electrical Cardioversion Indications:  persistent atrial flutter  Procedure Details Consent: direct current cardioversion Time Out: Verified patient identification, verified procedure, site/side was marked, verified correct patient position, special equipment/implants available, medications/allergies/relevent history reviewed, required imaging and test results available.   Patient placed on cardiac monitor, pulse oximetry, supplemental oxygen as necessary.  Sedation given: per anesthesia Pacer pads placed anterior and posterior chest  Cardioverted 1 time with 200 joules Cardioverted at 0906  Evaluation Findings: Post procedure EKG shows: normal sinus rhythmn Complications: none Patient did tolerate procedure well.   Nicholas Avila R 01/24/2018, 9:33 AM

## 2018-01-24 NOTE — H&P (Addendum)
Please refer to PA Strader's recent clinic note referenced below for full history. Patient presents to day for electrical cardioversion for atrial flutter.I have verablly verified with the patient that he has not missed any of his anticoagulation doses within the last 3 weeks.   Carlyle Dolly MD   Cardiology Office Note    Date:  01/16/2018   ID:  [name], DOB 1960-09-08, MRN [id number]  PCP:  Rosita Fire, MD      Cardiologist: Carlyle Dolly, MD       Chief Complaint  Patient presents with  . Hospitalization Follow-up    History of Present Illness:    JAVANTE NILSSON is a 57 y.o. male with past medical history of tobacco use and alcohol use who presents to the office today for hospital follow-up.   He was recently admitted to [facility] on 12/10/2017 for evaluation of fever, chills, and a productive cough, found to have sepsis secondary to presumed bronchitis with Lactic Acid elevated to 5.22 on admission. Cardiology was consulted as he was in atrial flutter with RVR. He was initially started on IV Cardizem and this was transitioned to Cardizem CD 240mg  daily and Toprol-XL 100mg  BID at the time of discharge. Although his CHA2DS2-VASc Score was 0, he was started on Eliquis 5mg  BID in case outpatient DCCV needed to be pursued. His hospitalization was also complicated by alcohol withdrawal with delirium tremens and he did require a Precedex drip. The importance of alcohol cessation was reviewed with the patient multiple times during his hospitalization.   In talking with the patient today, he reports overall doing well since his recent hospitalization. He does experience intermittent episodes of dizziness but says breathing has been at baseline and denies any recurrent palpitations. No recent chest pain, orthopnea, PND, or lower extremity edema.  He does report having missed 2 doses of Eliquis approximately 2 weeks ago due to his work schedule but has been compliant with  this since. He denies any evidence of melena, hematochezia, or hematuria. He wishes to stop his current medication regimen as soon as possible as he does not like taking scheduled medications.   He denies any recurrent alcohol use since hospital discharge. Does continue to smoke but has reduced this to 0.5 ppd.        Past Medical History:  Diagnosis Date  . Alcohol use   . Atrial flutter ([place])    a. diagnosed in 11/2017 during admission for Sepsis  . Tobacco use     History reviewed. No pertinent surgical history.  Current Medications:       Outpatient Medications Prior to Visit  Medication Sig Dispense Refill  . folic acid (FOLVITE) 1 MG tablet Take 1 tablet (1 mg total) by mouth daily. 30 tablet 0  . Multiple Vitamin (MULTIVITAMIN WITH MINERALS) TABS tablet Take 1 tablet by mouth daily. 30 tablet 0  . thiamine 100 MG tablet Take 1 tablet (100 mg total) by mouth daily. 30 tablet 0  . apixaban (ELIQUIS) 5 MG TABS tablet Take 1 tablet (5 mg total) by mouth 2 (two) times daily. 60 tablet 1  . diltiazem (CARDIZEM CD) 240 MG 24 hr capsule Take 1 capsule (240 mg total) by mouth daily. 30 capsule 0  . metoprolol succinate (TOPROL-XL) 100 MG 24 hr tablet Take 1 tablet (100 mg total) by mouth 2 (two) times daily. Take with or immediately following a meal. 60 tablet 0   No facility-administered medications prior to visit.  Allergies:   Patient has no known allergies.   Social History        Socioeconomic History  . Marital status: Single    Spouse name: Not on file  . Number of children: Not on file  . Years of education: Not on file  . Highest education level: Not on file  Occupational History  . Not on file  Social Needs  . Financial resource strain: Not on file  . Food insecurity:    Worry: Patient refused    Inability: Patient refused  . Transportation needs:    Medical: Patient refused    Non-medical: Patient refused  Tobacco Use  . Smoking  status: Current Every Day Smoker    Packs/day: 0.50    Types: Cigarettes  . Smokeless tobacco: Never Used  Substance and Sexual Activity  . Alcohol use: Yes    Comment: beer every day  . Drug use: Yes    Types: Marijuana  . Sexual activity: Not Currently  Lifestyle  . Physical activity:    Days per week: Patient refused    Minutes per session: Patient refused  . Stress: Not at all  Relationships  . Social connections:    Talks on phone: Patient refused    Gets together: Patient refused    Attends religious service: Patient refused    Active member of club or organization: Patient refused    Attends meetings of clubs or organizations: Patient refused    Relationship status: Patient refused  Other Topics Concern  . Not on file  Social History Narrative  . Not on file     Family History:  The patient's family history includes Colon cancer in his father; Heart disease in his maternal aunt.   Review of Systems:   Please see the history of present illness.     General:  No chills, fever, night sweats or weight changes.  Cardiovascular:  No chest pain, dyspnea on exertion, edema, orthopnea, palpitations, paroxysmal nocturnal dyspnea. Dermatological: No rash, lesions/masses Respiratory: No cough, dyspnea Urologic: No hematuria, dysuria Abdominal:   No nausea, vomiting, diarrhea, bright red blood per rectum, melena, or hematemesis Neurologic:  No visual changes, wkns, changes in mental status. Positive for dizziness.   All other systems reviewed and are otherwise negative except as noted above.   Physical Exam:    VS:  BP 126/74 (BP Location: Left Arm)   Pulse 68   Ht 5\' 11"  (1.803 m)   Wt 145 lb (65.8 kg)   SpO2 91%   BMI 20.22 kg/m    General: Well developed, well nourished [place] American male appearing in no acute distress. Head: Normocephalic, atraumatic, sclera non-icteric, no xanthomas, nares are without discharge.  Neck: No  carotid bruits. JVD not elevated.  Lungs: Respirations regular and unlabored, without wheezes or rales.  Heart: Irregularly irregular. No S3 or S4.  No murmur, no rubs, or gallops appreciated. Abdomen: Soft, non-tender, non-distended with normoactive bowel sounds. No hepatomegaly. No rebound/guarding. No obvious abdominal masses. Msk:  Strength and tone appear normal for age. No joint deformities or effusions. Extremities: No clubbing or cyanosis. No lower extremity edema.  Distal pedal pulses are 2+ bilaterally. Neuro: Alert and oriented X 3. Moves all extremities spontaneously. No focal deficits noted. Psych:  Responds to questions appropriately with a normal affect. Skin: No rashes or lesions noted     Wt Readings from Last 3 Encounters:  01/16/18 145 lb (65.8 kg)  12/17/17 131 lb 6.3 oz (59.6 kg)  05/31/13  145 lb (65.8 kg)     Studies/Labs Reviewed:   EKG:  EKG is ordered today.  The ekg ordered today demonstrates rate-controlled atrial flutter, HR 88.  Recent Labs: 12/10/2017: TSH 2.081 12/17/2017: ALT 12; BUN 7; Creatinine, Ser 0.62; Hemoglobin 12.9; Magnesium 1.6; Platelets 282; Potassium 3.5; Sodium 139   Lipid Panel Labs(Brief)  No results found for: CHOL, TRIG, HDL, CHOLHDL, VLDL, LDLCALC, LDLDIRECT    Additional studies/ records that were reviewed today include:   Echocardiogram: 12/11/2017 Study Conclusions  - Left ventricle: The cavity size was normal. Wall thickness was normal. Systolic function was normal. The estimated ejection fraction was in the range of 50% to 55%. Wall motion was normal; there were no regional wall motion abnormalities. The study is not technically sufficient to allow evaluation of LV diastolic function. - Aortic valve: Mildly calcified annulus. Trileaflet; normal thickness leaflets. Valve area (VTI): 2.53 cm^2. Valve area (Vmax): 2.77 cm^2. - Mitral valve: There was mild regurgitation. - Left atrium: The atrium was  mildly dilated. - Right ventricle: The cavity size was mildly to moderately dilated. Systolic function was mildly reduced. - Right atrium: The atrium was moderately dilated. - Tricuspid valve: There was moderate regurgitation. - Pulmonary arteries: Systolic pressure was mildly to moderately increased. PA peak pressure: 39 mm Hg (S). - Technically adequate study.  Assessment:    1. Atrial flutter, unspecified type ([place])   2. Current use of long term anticoagulation   3. Tobacco use   4. Alcohol use      Plan:   In order of problems listed above:  1. Atrial Flutter with RVR/ Use of Long-Term Anticoagulation - His arrhythmia was recently diagnosed during admission for sepsis secondary to presumed bronchitis and alcohol withdrawal. He reports intermittent episodes of dizziness but denies any recurrent dyspnea and he has not experienced any palpitations since onset. His heart rate is overall well controlled in the 60's to 80's during today's visit. I am concerned about his compliance with medications long-term as he voices multiple times that he does not wish to remain on medications for an extended period of time. We spent a significant portion of today's visit reviewing options including rate control versus consideration of a DCCV. Risks and benefits of a cardioversion were reviewed and he agrees to proceed with the procedure as recommended by Dr. Harl Bowie during his hospitalization. He did miss 2 doses of Eliquis approximately 2 weeks ago but has been compliant since. Will schedule his cardioversion for later next week once he has been on this uninterrupted for a full 3 weeks. He was advised to call our office if he misses any doses in the interim. - Will continue on Cardizem CD 240mg  daily and Toprol-XL 100mg  BID for rate control along with Eliquis for anticoagulation. Can hopefully stop Eliquis once 30 days out from DCCV if no recurrence of his arrhythmia.   2. Tobacco Use -  Continues to smoke 0.5 packs/day. Cessation was advised.  3. Alcohol Use - He has quit consuming alcohol since his recent hospitalization. Was congratulated on this with continued cessation advised.   Medication Adjustments/Labs and Tests Ordered: Current medicines are reviewed at length with the patient today.  Concerns regarding medicines are outlined above.  Medication changes, Labs and Tests ordered today are listed in the Patient Instructions below. Patient Instructions  Medication Instructions:  Your physician recommends that you continue on your current medications as directed. Please refer to the Current Medication list given to you today.  Labwork: NONE  Testing/Procedures: Your physician has recommended that you have a Cardioversion (DCCV). Electrical Cardioversion uses a jolt of electricity to your heart either through paddles or wired patches attached to your chest. This is a controlled, usually prescheduled, procedure. Defibrillation is done under light anesthesia in the hospital, and you usually go home the day of the procedure. This is done to get your heart back into a normal rhythm. You are not awake for the procedure. Please see the instruction sheet given to you today.    Follow-Up: Your physician recommends that you schedule a follow-up appointment in:  4 WEEKS POST CARDIOVERSION    Signed, Erma Heritage, PA-C  01/16/2018 5:28 PM    [place]. [address] [place], [place] [address] Phone: [phone]

## 2018-01-24 NOTE — Anesthesia Preprocedure Evaluation (Signed)
Anesthesia Evaluation  Patient identified by MRN, date of birth, ID band Patient awake    Reviewed: Allergy & Precautions, NPO status , Patient's Chart, lab work & pertinent test results  Airway Mallampati: I  TM Distance: >3 FB Neck ROM: Full    Dental no notable dental hx. (+) Teeth Intact   Pulmonary neg pulmonary ROS, Current Smoker,  States occ MJ -last about 3 days ago -denies any current cigarette use  Denies any pulm meds   Pulmonary exam normal breath sounds clear to auscultation       Cardiovascular Exercise Tolerance: Good negative cardio ROS Normal cardiovascular exam+ dysrhythmias I Rhythm:Regular Rate:Normal  States can walk a mile    Neuro/Psych negative neurological ROS  negative psych ROS   GI/Hepatic negative GI ROS, Neg liver ROS,   Endo/Other  negative endocrine ROS  Renal/GU negative Renal ROS  negative genitourinary   Musculoskeletal negative musculoskeletal ROS (+)   Abdominal   Peds negative pediatric ROS (+)  Hematology negative hematology ROS (+)   Anesthesia Other Findings   Reproductive/Obstetrics negative OB ROS                             Anesthesia Physical Anesthesia Plan  ASA: II  Anesthesia Plan: General   Post-op Pain Management:    Induction: Intravenous  PONV Risk Score and Plan:   Airway Management Planned: Simple Face Mask and Nasal Cannula  Additional Equipment:   Intra-op Plan:   Post-operative Plan:   Informed Consent: I have reviewed the patients History and Physical, chart, labs and discussed the procedure including the risks, benefits and alternatives for the proposed anesthesia with the patient or authorized representative who has indicated his/her understanding and acceptance.   Dental advisory given  Plan Discussed with: CRNA  Anesthesia Plan Comments: (GA vs GETA )        Anesthesia Quick Evaluation

## 2018-01-25 ENCOUNTER — Telehealth: Payer: Self-pay | Admitting: Student

## 2018-01-25 NOTE — Telephone Encounter (Signed)
Work slip created, pt to pick up

## 2018-01-25 NOTE — Telephone Encounter (Signed)
Done by Dr.branch, may I give him work excuse for today ?

## 2018-01-25 NOTE — Telephone Encounter (Signed)
    Yes, can provide work note for yesterday and today. Thank you.   Signed, Erma Heritage, PA-C 01/25/2018, 9:04 AM Pager: [phone]

## 2018-01-25 NOTE — Telephone Encounter (Signed)
Pt is needing to be out of work today since he had a cardioversion yesterday and he's feeling sore, he's going to need a note.

## 2018-01-28 ENCOUNTER — Encounter (HOSPITAL_COMMUNITY): Payer: Self-pay | Admitting: Cardiology

## 2018-02-25 NOTE — Progress Notes (Signed)
Cardiology Office Note    Date:  02/26/2018   ID:  Nicholas Avila, DOB 1960/06/27, MRN [id number]  PCP:  Rosita Fire, MD  Cardiologist: Carlyle Dolly, MD    Chief Complaint  Patient presents with  . Follow-up    s/p DCCV     History of Present Illness:    Nicholas Avila is a 57 y.o. male with past medical history of persistent atrial flutter (diagnosed during admission in 11/2017 for sepsis), tobacco use, prior alcohol use, and pulmonary nodules (by CT in 11/2017) who presents to the office today for follow-up of his recent DCCV.   He was examined by myself on 01/16/2018 for hospital follow-up during which time frame he was found to have atrial flutter with RVR in the setting of sepsis. At the time of his visit, he reported overall doing well and denied any recent chest pain, dyspnea, or recurrent palpitations. EKG at the time of his visit confirmed that he was still in atrial flutter but heart rate was well controlled in the 60's to 80's. Given that he had missed 2 doses of Eliquis in the past 2 weeks, a cardioversion was scheduled for 3 weeks out once he had been compliant with the medication.  He underwent successful DCCV by Dr. Harl Bowie on 01/24/2018 with conversion to normal sinus rhythm following a single synchronized 200 J shock. Diltiazem was discontinued and Toprol-XL was reduced from 100 mg BID to 50mg  BID.   In talking with the patient and his wife today, he reports overall doing well since his recent procedure and denies any recent palpitations, chest pain, dyspnea, orthopnea, PND, or lower extremity edema. He reports good compliance with Eliquis and denies missing any recent doses. He has not noticed any melena, hematochezia, or hematuria.  He does check blood pressure occasionally at home and says this has overall been well-controlled. BP is at 132/60 during today's visit. He mentions that he has been experiencing issues with erectile dysfunction since being started on the  medications for atrial fibrillation.    Past Medical History:  Diagnosis Date  . Alcohol use   . Atrial flutter ([place])    a. diagnosed in 11/2017 during admission for Sepsis b. s/p successful DCCV in 01/2018.  Marland Kitchen Dysrhythmia    AFib  . Tobacco use     Past Surgical History:  Procedure Laterality Date  . CARDIOVERSION N/A 01/24/2018   Procedure: CARDIOVERSION;  Surgeon: Arnoldo Lenis, MD;  Location: [facility];  Service: Endoscopy;  Laterality: N/A;    Current Medications: Outpatient Medications Prior to Visit  Medication Sig Dispense Refill  . apixaban (ELIQUIS) 5 MG TABS tablet Take 1 tablet (5 mg total) by mouth 2 (two) times daily. 60 tablet 3  . folic acid (FOLVITE) 1 MG tablet Take 1 mg by mouth daily.  3  . metoprolol succinate (TOPROL-XL) 50 MG 24 hr tablet Take 1 tablet (50 mg total) by mouth 2 (two) times daily. Take with or immediately following a meal. 60 tablet 3   No facility-administered medications prior to visit.      Allergies:   Patient has no known allergies.   Social History   Socioeconomic History  . Marital status: Single    Spouse name: Not on file  . Number of children: Not on file  . Years of education: Not on file  . Highest education level: Not on file  Occupational History  . Not on file  Social Needs  . Financial  resource strain: Not on file  . Food insecurity:    Worry: Patient refused    Inability: Patient refused  . Transportation needs:    Medical: Patient refused    Non-medical: Patient refused  Tobacco Use  . Smoking status: Current Every Day Smoker    Packs/day: 0.75    Years: 35.00    Pack years: 26.25    Types: Cigarettes  . Smokeless tobacco: Never Used  Substance and Sexual Activity  . Alcohol use: Not Currently    Comment: stopped 12/10/2017  . Drug use: Yes    Types: Marijuana    Comment: last used 01/22/2018  . Sexual activity: Not Currently  Lifestyle  . Physical activity:    Days per week: Patient refused     Minutes per session: Patient refused  . Stress: Not at all  Relationships  . Social connections:    Talks on phone: Patient refused    Gets together: Patient refused    Attends religious service: Patient refused    Active member of club or organization: Patient refused    Attends meetings of clubs or organizations: Patient refused    Relationship status: Patient refused  Other Topics Concern  . Not on file  Social History Narrative  . Not on file     Family History:  The patient's family history includes Colon cancer in his father; Heart disease in his maternal aunt.   Review of Systems:   Please see the history of present illness.     General:  No chills, fever, night sweats or weight changes.  Cardiovascular:  No chest pain, dyspnea on exertion, edema, orthopnea, palpitations, paroxysmal nocturnal dyspnea. Positive for erectile dysfunction. Dermatological: No rash, lesions/masses Respiratory: No cough, dyspnea Urologic: No hematuria, dysuria Abdominal:   No nausea, vomiting, diarrhea, bright red blood per rectum, melena, or hematemesis Neurologic:  No visual changes, wkns, changes in mental status. All other systems reviewed and are otherwise negative except as noted above.   Physical Exam:    VS:  BP 132/60   Pulse 62   Ht 5' 11.25" (1.81 m)   Wt 144 lb 9.6 oz (65.6 kg)   SpO2 99%   BMI 20.03 kg/m    General: Well developed, well nourished [place] American male appearing in no acute distress. Head: Normocephalic, atraumatic, sclera non-icteric, no xanthomas, nares are without discharge.  Neck: No carotid bruits. JVD not elevated.  Lungs: Respirations regular and unlabored, without wheezes or rales.  Heart: Regular rate and rhythm. No S3 or S4.  No murmur, no rubs, or gallops appreciated. Abdomen: Soft, non-tender, non-distended with normoactive bowel sounds. No hepatomegaly. No rebound/guarding. No obvious abdominal masses. Msk:  Strength and tone appear normal  for age. No joint deformities or effusions. Extremities: No clubbing or cyanosis. No lower extremity edema.  Distal pedal pulses are 2+ bilaterally. Neuro: Alert and oriented X 3. Moves all extremities spontaneously. No focal deficits noted. Psych:  Responds to questions appropriately with a normal affect. Skin: No rashes or lesions noted  Wt Readings from Last 3 Encounters:  02/26/18 144 lb 9.6 oz (65.6 kg)  01/22/18 149 lb (67.6 kg)  01/16/18 145 lb (65.8 kg)     Studies/Labs Reviewed:   EKG:  EKG is ordered today.  The ekg ordered today demonstrates normal sinus rhythm, heart rate 60, with no acute ST changes when compared to prior tracings.  Recent Labs: 12/10/2017: TSH 2.081 12/17/2017: ALT 12; Hemoglobin 12.9; Magnesium 1.6; Platelets 282 01/22/2018: BUN 9;  Creatinine, Ser 0.82; Potassium 3.6; Sodium 142   Lipid Panel No results found for: CHOL, TRIG, HDL, CHOLHDL, VLDL, LDLCALC, LDLDIRECT  Additional studies/ records that were reviewed today include:   Echocardiogram: 12/11/2017 Study Conclusions  - Left ventricle: The cavity size was normal. Wall thickness was   normal. Systolic function was normal. The estimated ejection   fraction was in the range of 50% to 55%. Wall motion was normal;   there were no regional wall motion abnormalities. The study is   not technically sufficient to allow evaluation of LV diastolic   function. - Aortic valve: Mildly calcified annulus. Trileaflet; normal   thickness leaflets. Valve area (VTI): 2.53 cm^2. Valve area   (Vmax): 2.77 cm^2. - Mitral valve: There was mild regurgitation. - Left atrium: The atrium was mildly dilated. - Right ventricle: The cavity size was mildly to moderately   dilated. Systolic function was mildly reduced. - Right atrium: The atrium was moderately dilated. - Tricuspid valve: There was moderate regurgitation. - Pulmonary arteries: Systolic pressure was mildly to moderately   increased. PA peak pressure: 39 mm  Hg (S). - Technically adequate study.  Assessment:    1. Atrial flutter, unspecified type ([place])   2. Pulmonary nodules   3. Drug-induced erectile dysfunction   4. History of alcohol abuse   5. Tobacco use      Plan:   In order of problems listed above:  1. Persistent Atrial Flutter - he is s/p successful DCCV by Dr. Harl Bowie on 01/24/2018 and is maintaining NSR by examination and confirmed by EKG today. He denies any recurrent palpitations or dyspnea and feels back to baseline. He strongly wishes to stop all of his medications and we reviewed that Eliquis can be discontinued as previously outlined by Dr. Harl Bowie as he is 30 days out from his DCCV and his CHA2DS2-VASc Score is 0. He has 2 weeks left of the medication and I encouraged him to finish this.  - His BP is at 132/60 during today's visit and he did not have a formal diagnosis of hypertension prior to his hospitalization. Will plan to reduce Toprol-XL from 50 mg twice daily to 50 mg daily and have him follow blood pressure in the ambulatory setting  If this remains well-controlled over the next few weeks, can likely discontinue.    2. Abnormal Chest CT/ Pulmonary Nodules - Noted to have numerous pulmonary nodules on CT imaging in 11/2017 with a repeat non-contrast chest CT recommended in 3 - 6 months. Will place the order for this today.   3. Erectile Dysfunction - Patient reports symptoms started when he was started on Cardizem CD and Toprol-XL. I suspect symptoms are secondary to beta-blocker therapy. Cardizem CD was discontinued at the time of DCCV and Toprol-XL dosing was reduced. Will plan to further reduce Toprol-XL to 50 mg once daily and have him follow BP at home as this can likely be discontinued pending BP or switched to a different medication if BP remains elevated.  4. Alcohol Abuse - he reports having not consumed alcohol since his hospital admission in 11/2017.  Was congratulated on this.  5. Tobacco Use - He is still  smoking 0.75 ppd. Continued reduction advised.    Medication Adjustments/Labs and Tests Ordered: Current medicines are reviewed at length with the patient today.  Concerns regarding medicines are outlined above.  Medication changes, Labs and Tests ordered today are listed in the Patient Instructions below. Patient Instructions  Medication Instructions:  Your physician has  recommended you make the following change in your medication:  Stop Taking Eliquis when current bottle is complete. Decrease Toprol XL to 50 mg Daily   If you need a refill on your cardiac medications before your next appointment, please call your pharmacy.   Lab work: NONE  If you have labs (blood work) drawn today and your tests are completely normal, you will receive your results only by: Marland Kitchen MyChart Message (if you have MyChart) OR . A paper copy in the mail If you have any lab test that is abnormal or we need to change your treatment, we will call you to review the results.  Testing/Procedures: NONE   Follow-Up: At [facility], you and your health needs are our priority.  As part of our continuing mission to provide you with exceptional heart care, we have created designated Provider Care Teams.  These Care Teams include your primary Cardiologist (physician) and Advanced Practice Providers (APPs -  Physician Assistants and Nurse Practitioners) who all work together to provide you with the care you need, when you need it. You will need a follow up appointment in 3 months.  Please call our office 2 months in advance to schedule this appointment.  You may see Carlyle Dolly, MD or one of the following Advanced Practice Providers on your designated Care Team:   Bernerd Pho, PA-C [facility]) . Ermalinda Barrios, PA-C ([place])  Any Other Special Instructions Will Be Listed Below (If Applicable). Thank you for choosing [facility]!     Signed, Erma Heritage, PA-C  02/26/2018  5:46 PM    [facility] [address] [place], [place] [address] Phone: [phone]

## 2018-02-26 ENCOUNTER — Encounter: Payer: Self-pay | Admitting: Student

## 2018-02-26 ENCOUNTER — Ambulatory Visit (INDEPENDENT_AMBULATORY_CARE_PROVIDER_SITE_OTHER): Payer: BLUE CROSS/BLUE SHIELD | Admitting: Student

## 2018-02-26 VITALS — BP 132/60 | HR 62 | Ht 71.25 in | Wt 144.6 lb

## 2018-02-26 DIAGNOSIS — R918 Other nonspecific abnormal finding of lung field: Secondary | ICD-10-CM

## 2018-02-26 DIAGNOSIS — F1011 Alcohol abuse, in remission: Secondary | ICD-10-CM | POA: Diagnosis not present

## 2018-02-26 DIAGNOSIS — Z720 Tobacco use: Secondary | ICD-10-CM

## 2018-02-26 DIAGNOSIS — I4892 Unspecified atrial flutter: Secondary | ICD-10-CM

## 2018-02-26 DIAGNOSIS — N522 Drug-induced erectile dysfunction: Secondary | ICD-10-CM | POA: Diagnosis not present

## 2018-02-26 MED ORDER — METOPROLOL SUCCINATE ER 50 MG PO TB24: 50.0000 mg | ORAL_TABLET | Freq: Every day | ORAL | 3 refills | Status: DC

## 2018-02-26 NOTE — Patient Instructions (Signed)
Medication Instructions:  Your physician has recommended you make the following change in your medication:  Stop Taking Eliquis when current bottle is complete. Decrease Toprol XL to 50 mg Daily   If you need a refill on your cardiac medications before your next appointment, please call your pharmacy.   Lab work: NONE  If you have labs (blood work) drawn today and your tests are completely normal, you will receive your results only by: Marland Kitchen MyChart Message (if you have MyChart) OR . A paper copy in the mail If you have any lab test that is abnormal or we need to change your treatment, we will call you to review the results.  Testing/Procedures: NONE   Follow-Up: At [facility], you and your health needs are our priority.  As part of our continuing mission to provide you with exceptional heart care, we have created designated Provider Care Teams.  These Care Teams include your primary Cardiologist (physician) and Advanced Practice Providers (APPs -  Physician Assistants and Nurse Practitioners) who all work together to provide you with the care you need, when you need it. You will need a follow up appointment in 3 months.  Please call our office 2 months in advance to schedule this appointment.  You may see Carlyle Dolly, MD or one of the following Advanced Practice Providers on your designated Care Team:   Bernerd Pho, PA-C [facility]) . Ermalinda Barrios, PA-C ([place])  Any Other Special Instructions Will Be Listed Below (If Applicable). Thank you for choosing [facility]!

## 2018-02-28 ENCOUNTER — Telehealth: Payer: Self-pay | Admitting: *Deleted

## 2018-02-28 DIAGNOSIS — R911 Solitary pulmonary nodule: Secondary | ICD-10-CM

## 2018-02-28 NOTE — Telephone Encounter (Signed)
-----   Message from Erma Heritage, [place] sent at 02/26/2018  5:40 PM EDT ----- Regarding: Chest CT Kisha,   Dr. Harl Bowie had mentioned in his past notes that he wanted this patient to have a noncontrast chest CT for pulmonary nodules. I completely forgot to review it with the patient at the time of his visit today but I did call him after clinic and he is in agreement for this. It does not need to be urgent but can be obtained within the next 1 to 2 months. If you can assist with placing the order/scheduling it, I would greatly appreciate it!  Thanks,  [place]

## 2018-03-20 DIAGNOSIS — I480 Paroxysmal atrial fibrillation: Secondary | ICD-10-CM | POA: Diagnosis not present

## 2018-03-20 DIAGNOSIS — F17200 Nicotine dependence, unspecified, uncomplicated: Secondary | ICD-10-CM | POA: Diagnosis not present

## 2018-03-20 DIAGNOSIS — J209 Acute bronchitis, unspecified: Secondary | ICD-10-CM | POA: Diagnosis not present

## 2018-03-25 NOTE — Addendum Note (Signed)
Addended by: Levonne Hubert on: 03/25/2018 05:18 PM   Modules accepted: Orders

## 2018-03-27 ENCOUNTER — Ambulatory Visit (HOSPITAL_COMMUNITY)
Admission: RE | Admit: 2018-03-27 | Discharge: 2018-03-27 | Disposition: A | Discharge status: Home / Self Care | Payer: BLUE CROSS/BLUE SHIELD | Source: Ambulatory Visit | Attending: Student | Admitting: Student

## 2018-03-27 DIAGNOSIS — J432 Centrilobular emphysema: Secondary | ICD-10-CM | POA: Diagnosis not present

## 2018-03-27 DIAGNOSIS — I700 Atherosclerosis of aorta: Secondary | ICD-10-CM | POA: Insufficient documentation

## 2018-03-27 DIAGNOSIS — I2510 Atherosclerotic heart disease of native coronary artery without angina pectoris: Secondary | ICD-10-CM | POA: Diagnosis not present

## 2018-03-27 DIAGNOSIS — R918 Other nonspecific abnormal finding of lung field: Secondary | ICD-10-CM

## 2018-03-27 IMAGING — CT CT CHEST W/O CM
2 of 3 series · 15 of 36 positions shown, 18 images · non-contrast
Comparison: [DATE]

CLINICAL DATA: Follow-up pulmonary nodules

EXAM:
CT CHEST WITHOUT CONTRAST
TECHNIQUE: Multidetector CT imaging of the chest was performed following the
standard protocol without IV contrast.

[Series 2: thorax · axial · 0.63mm/px · z∈[-365,-89]mm · 12 of 163 slices shown, 15 images]
[im 13/163  mediastinal]
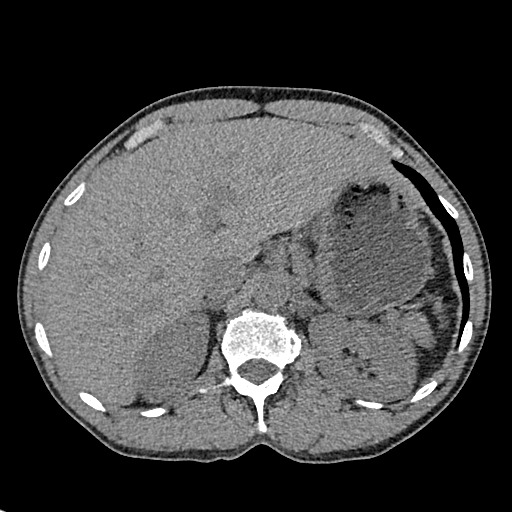
[im 13/163  lung]
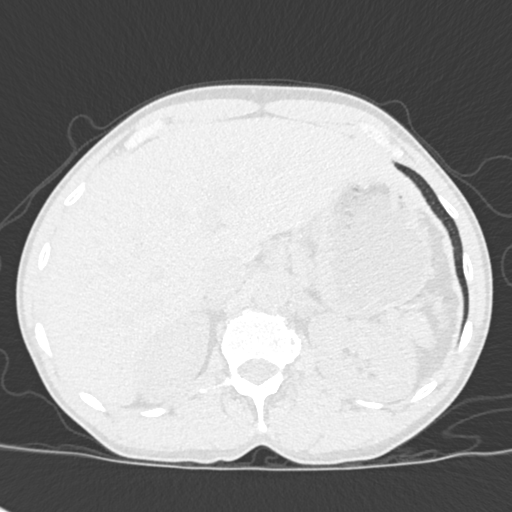
[im 25/163  lung]
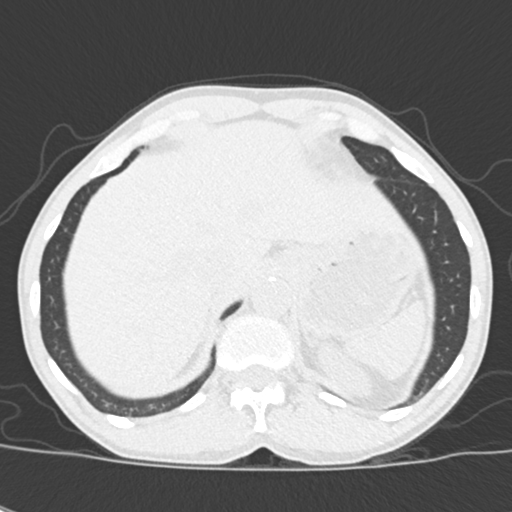
[im 37/163  lung]
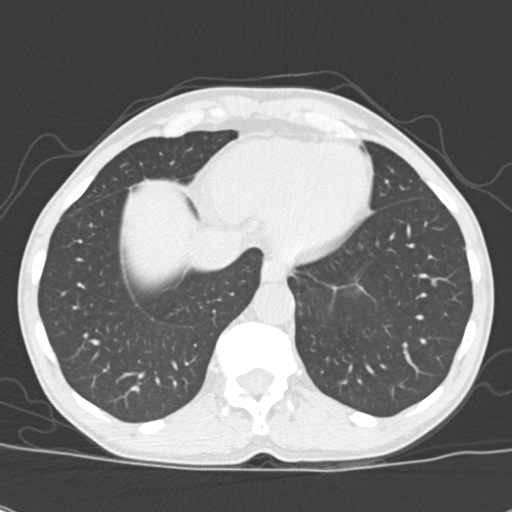
[im 49/163  lung]
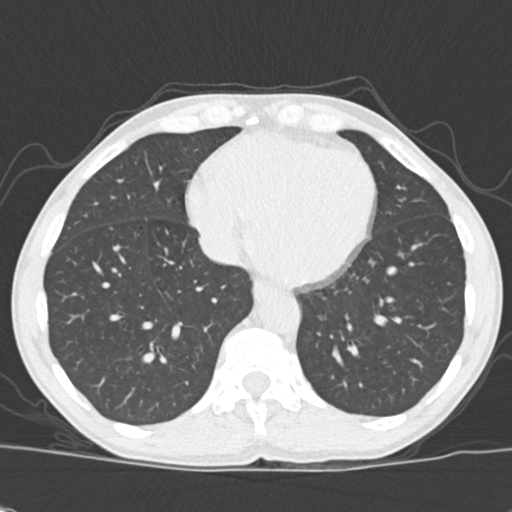
[im 61/163  mediastinal]
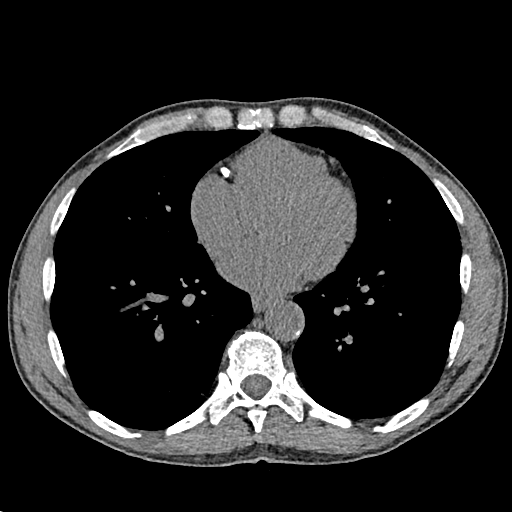
[im 61/163  lung]
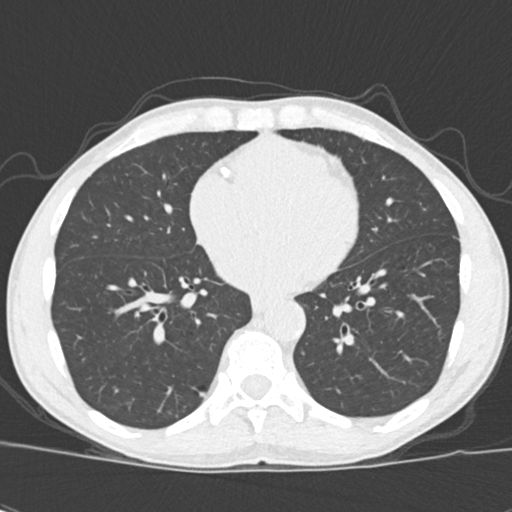
[im 73/163  lung]
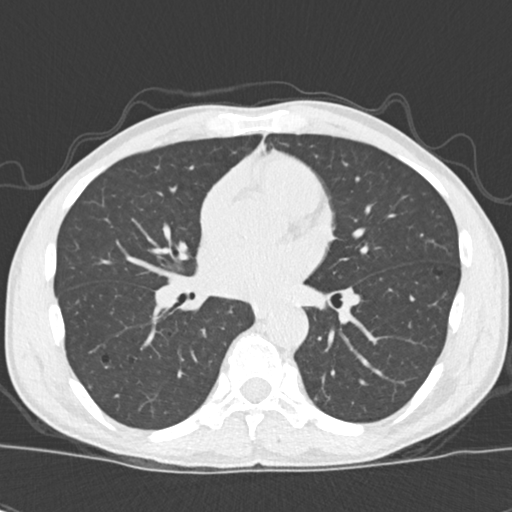
[im 91/163  lung]
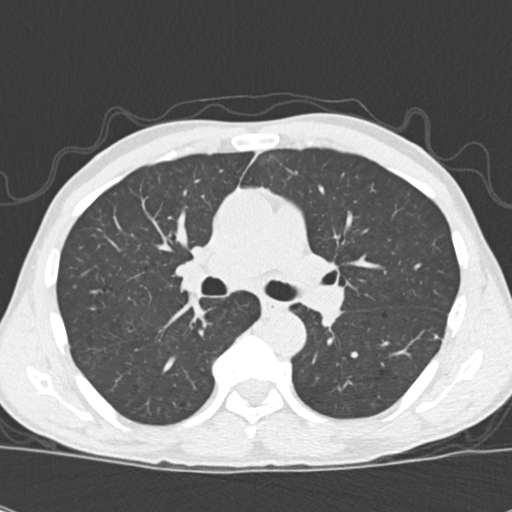
[im 103/163  lung]
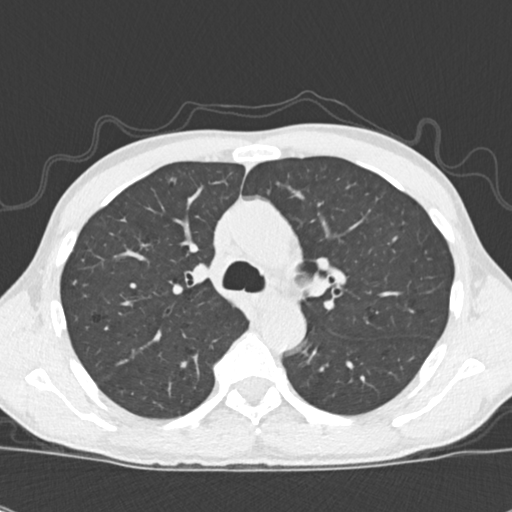
[im 115/163  mediastinal]
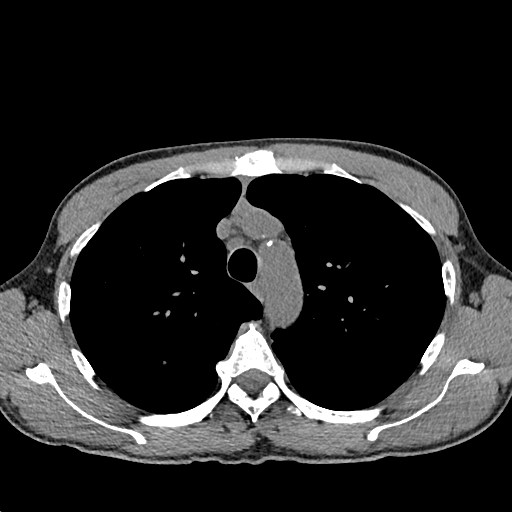
[im 115/163  lung]
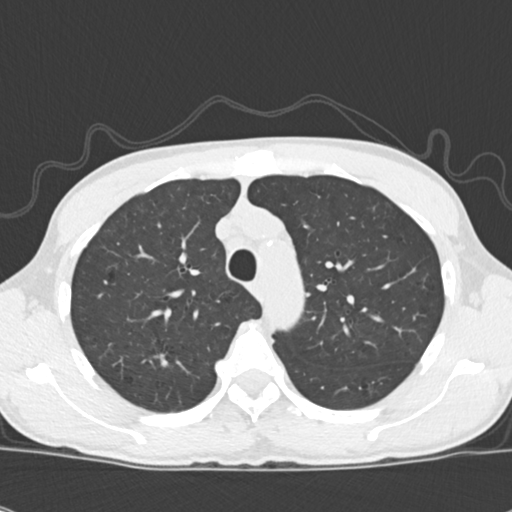
[im 127/163  lung]
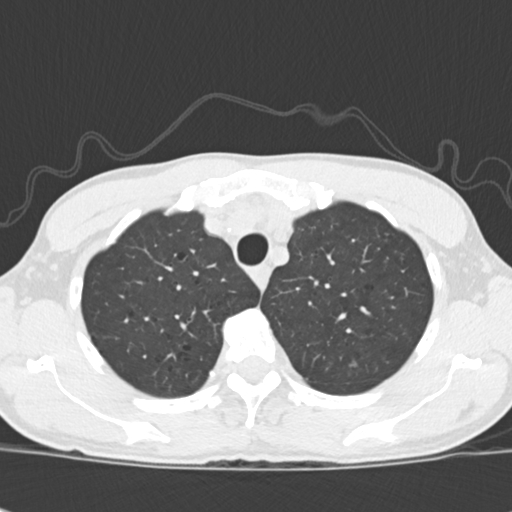
[im 139/163  lung]
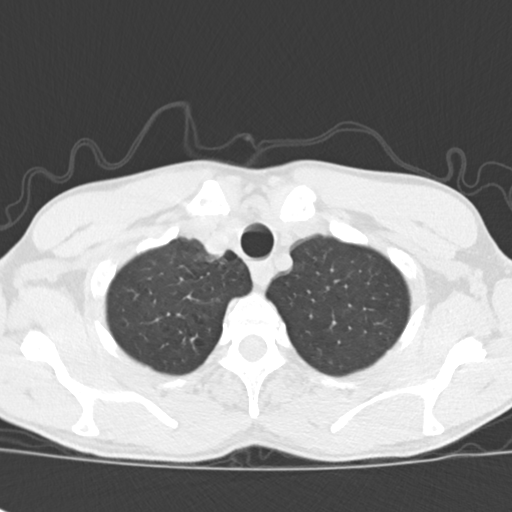
[im 151/163  lung]
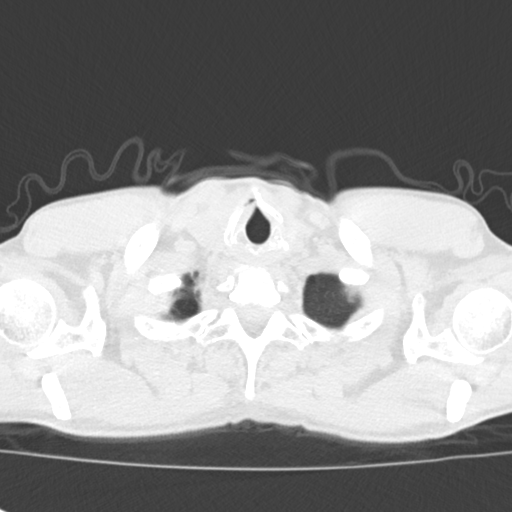

[Series 5: coronal · coronal · 0.64mm/px · 3 of 144 slices shown]
[im 29/144  lung]
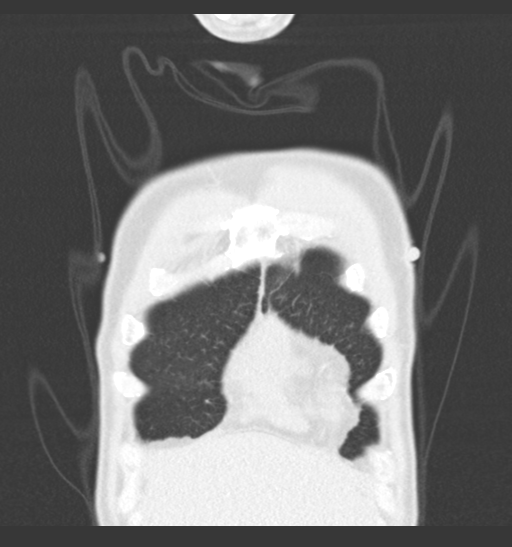
[im 58/144  lung]
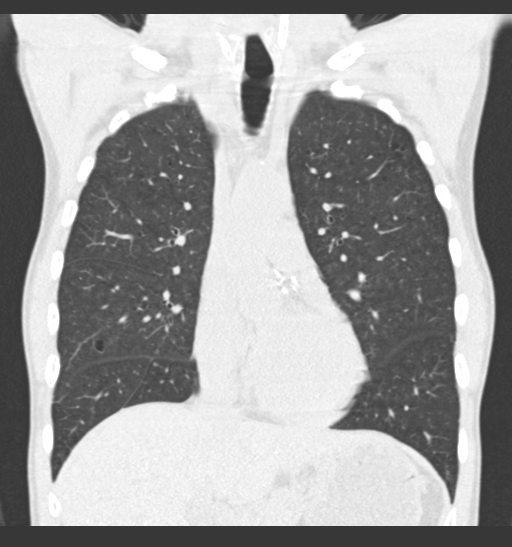
[im 86/144  lung]
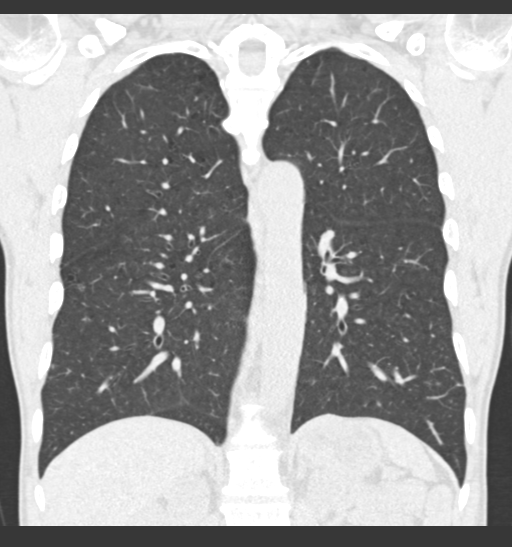

[15 of 36 positions shown; findings below may reference images not displayed]

FINDINGS: Cardiovascular: Scattered moderate aortic calcifications.
Calcifications noted in the left main, left anterior descending and
right coronary arteries. No evidence of aortic aneurysm. Heart is
normal size.

Mediastinum/Nodes: No mediastinal, hilar, or axillary adenopathy.

Lungs/Pleura: Mild centrilobular emphysema. There are several small
scattered solid and subsolid nodules throughout the lungs
bilaterally. Most of which appear stable. Some are slightly less
conspicuous. No new or enlarging pulmonary nodules. No confluent
opacities or effusions.

Upper Abdomen: Imaging into the upper abdomen shows no acute
findings.

Musculoskeletal: Chest wall soft tissues are unremarkable. No acute
bony abnormality.
IMPRESSION: Numerous bilateral solid and subsolid nodules within the lungs, 5 mm
or less in size. These are stable or slightly improved since prior
study. No new or enlarging pulmonary nodules. Favor a benign
process, but recommend continued follow-up with repeat CT in 6-12
months.

Moderate coronary artery disease with calcifications in the left
main, left anterior descending and right coronary arteries.

Aortic Atherosclerosis ([9F]-[9F]) and Emphysema ([9F]-[9F]).

## 2018-04-02 ENCOUNTER — Telehealth: Payer: Self-pay

## 2018-04-02 NOTE — Telephone Encounter (Signed)
-----   Message from Erma Heritage, [place] sent at 03/28/2018  7:17 AM EST ----- Please let the patient know that his Chest CT showed that his lung nodules remained stable which favors a benign process. Repeat imaging was recommended in 6 to 12 months and can be obtained by his PCP. Please forward a copy of this to Rosita Fire, MD. Thank you.

## 2018-04-02 NOTE — Telephone Encounter (Signed)
Called pt. No answer, left message for pt to return call.  

## 2018-04-08 ENCOUNTER — Telehealth: Payer: Self-pay

## 2018-04-08 ENCOUNTER — Encounter: Payer: Self-pay | Admitting: Gastroenterology

## 2018-04-08 ENCOUNTER — Other Ambulatory Visit: Payer: Self-pay

## 2018-04-08 ENCOUNTER — Ambulatory Visit (INDEPENDENT_AMBULATORY_CARE_PROVIDER_SITE_OTHER): Payer: BLUE CROSS/BLUE SHIELD | Admitting: Gastroenterology

## 2018-04-08 DIAGNOSIS — F1021 Alcohol dependence, in remission: Secondary | ICD-10-CM

## 2018-04-08 DIAGNOSIS — Z1211 Encounter for screening for malignant neoplasm of colon: Secondary | ICD-10-CM

## 2018-04-08 MED ORDER — PEG 3350-KCL-NA BICARB-NACL 420 G PO SOLR: 4000.0000 mL | ORAL | 0 refills | Status: DC

## 2018-04-08 NOTE — Assessment & Plan Note (Signed)
Very pleasant 57 year old gentleman with history of prior alcohol abuse, currently in remission, atrial flutter status post cardioversion in September, on Eliquis who presents to schedule first ever screening colonoscopy.  No family history of colon cancer.  He believes his father had prostate cancer, previously was reported as possible colon cancer in patient's medical history.  He is currently completing Eliquis, patient states he has about a week left.  Plan on screening colonoscopy with deep sedation (iven history of alcohol abuse).  I have discussed the risks, alternatives, benefits with regards to but not limited to the risk of reaction to medication, bleeding, infection, perforation and the patient is agreeable to proceed. Written consent to be obtained.

## 2018-04-08 NOTE — Progress Notes (Signed)
Primary Care Physician:  Rosita Fire, MD  Primary Gastroenterologist:  Garfield Cornea, MD   Chief Complaint  Patient presents with  . Consult    Here to schedule TCS. Never had one prior    HPI:  Nicholas Avila is a 57 y.o. male here as a referral for screening colonoscopy at the request of Dr. Legrand Rams.  Due to Eliquis and history of alcohol abuse, patient was brought in for evaluation.  He has a history of atrial flutter, diagnosed during admission in July 2019 for pulmonary infection/sepsis.  He developed acute alcohol withdrawal with DTs during his hospitalization.  Patient reports that his last alcohol use was day of admission on July 29.  He underwent successful cardioversion in September 2019.  Currently remains on Eliquis but states he was told to stop once he completed current prescription.  Patient has a bowel movement twice per day.  No rectal bleeding.  No melena.  Remotely had an EGD for reflux while in [place].  States he was told to stop eating [place].  He denies any heartburn, dysphagia, vomiting, abdominal pain at this time.  Feeling a lot better since he quit drinking in July.  No prior colonoscopy.    Current Outpatient Medications  Medication Sig Dispense Refill  . apixaban (ELIQUIS) 5 MG TABS tablet Take 1 tablet (5 mg total) by mouth 2 (two) times daily. 60 tablet 3  . metoprolol succinate (TOPROL-XL) 50 MG 24 hr tablet Take 1 tablet (50 mg total) by mouth daily. 90 tablet 3   No current facility-administered medications for this visit.     Allergies as of 04/08/2018  . (No Known Allergies)    Past Medical History:  Diagnosis Date  . Alcoholism in remission ([place])    quit 12/10/17. longest time alcohol free. reports multiple previous DUIs (04/08/18)  . Atrial flutter ([place])    a. diagnosed in 11/2017 during admission for Sepsis b. s/p successful DCCV in 01/2018.  Marland Kitchen Dysrhythmia    AFib  . Pulmonary nodules   . Tobacco use     Past Surgical History:   Procedure Laterality Date  . CARDIOVERSION N/A 01/24/2018   Procedure: CARDIOVERSION;  Surgeon: Arnoldo Lenis, MD;  Location: [facility];  Service: Endoscopy;  Laterality: N/A;    Family History  Problem Relation Age of Onset  . Prostate cancer Father        late 71s  . Heart disease Maternal Aunt   . Colon cancer Neg Hx     Social History   Socioeconomic History  . Marital status: Single    Spouse name: Not on file  . Number of children: Not on file  . Years of education: Not on file  . Highest education level: Not on file  Occupational History  . Not on file  Social Needs  . Financial resource strain: Not on file  . Food insecurity:    Worry: Patient refused    Inability: Patient refused  . Transportation needs:    Medical: Patient refused    Non-medical: Patient refused  Tobacco Use  . Smoking status: Current Every Day Smoker    Packs/day: 0.75    Years: 35.00    Pack years: 26.25    Types: Cigarettes  . Smokeless tobacco: Never Used  Substance and Sexual Activity  . Alcohol use: Not Currently    Comment: stopped 12/10/2017, prior heavy use  . Drug use: Yes    Types: Marijuana    Comment: last  used 01/22/2018  . Sexual activity: Not Currently  Lifestyle  . Physical activity:    Days per week: Patient refused    Minutes per session: Patient refused  . Stress: Not at all  Relationships  . Social connections:    Talks on phone: Patient refused    Gets together: Patient refused    Attends religious service: Patient refused    Active member of club or organization: Patient refused    Attends meetings of clubs or organizations: Patient refused    Relationship status: Patient refused  . Intimate partner violence:    Fear of current or ex partner: Patient refused    Emotionally abused: Patient refused    Physically abused: Patient refused    Forced sexual activity: Patient refused  Other Topics Concern  . Not on file  Social History Narrative  . Not  on file      ROS:  General: Negative for anorexia, weight loss, fever, chills, fatigue, weakness. Eyes: Negative for vision changes.  ENT: Negative for hoarseness, difficulty swallowing , nasal congestion. CV: Negative for chest pain, angina, palpitations, dyspnea on exertion, peripheral edema.  Respiratory: Negative for dyspnea at rest, dyspnea on exertion, cough, sputum, wheezing.  GI: See history of present illness. GU:  Negative for dysuria, hematuria, urinary incontinence, urinary frequency, nocturnal urination.  MS: Negative for joint pain, low back pain.  Derm: Negative for rash or itching.  Neuro: Negative for weakness, abnormal sensation, seizure, frequent headaches, memory loss, confusion.  Psych: Negative for anxiety, depression, suicidal ideation, hallucinations.  Endo: Negative for unusual weight change.  Heme: Negative for bruising or bleeding. Allergy: Negative for rash or hives.    Physical Examination:  BP 116/69   Pulse (!) 53   Temp (!) 97 F (36.1 C)   Ht 5\' 11"  (1.803 m)   Wt 140 lb 9.6 oz (63.8 kg)   BMI 19.61 kg/m    General: Well-nourished, well-developed in no acute distress.  Head: Normocephalic, atraumatic.   Eyes: Conjunctiva pink, no icterus. Mouth: Oropharyngeal mucosa moist and pink , no lesions erythema or exudate. Neck: Supple without thyromegaly, masses, or lymphadenopathy.  Lungs: Clear to auscultation bilaterally.  Heart: Regular rate and rhythm, no murmurs rubs or gallops.  Abdomen: Bowel sounds are normal, nontender, nondistended, no hepatosplenomegaly or masses, no abdominal bruits or    hernia , no rebound or guarding.   Rectal: not performed Extremities: No lower extremity edema. No clubbing or deformities.  Neuro: Alert and oriented x 4 , grossly normal neurologically.  Skin: Warm and dry, no rash or jaundice.   Psych: Alert and cooperative, normal mood and affect.  Labs: Lab Results  Component Value Date   CREATININE 0.82  01/22/2018   BUN 9 01/22/2018   NA 142 01/22/2018   K 3.6 01/22/2018   CL 106 01/22/2018   CO2 29 01/22/2018   Lab Results  Component Value Date   ALT 12 12/17/2017   AST 14 (L) 12/17/2017   ALKPHOS 105 12/17/2017   BILITOT 0.5 12/17/2017   Lab Results  Component Value Date   WBC 9.1 12/17/2017   HGB 12.9 (L) 12/17/2017   HCT 39.2 12/17/2017   MCV 94.9 12/17/2017   PLT 282 12/17/2017     Imaging Studies: Ct Chest Wo Contrast  Result Date: 03/27/2018 CLINICAL DATA:  Follow-up pulmonary nodules EXAM: CT CHEST WITHOUT CONTRAST TECHNIQUE: Multidetector CT imaging of the chest was performed following the standard protocol without IV contrast. COMPARISON:  12/10/2017 FINDINGS:  Cardiovascular: Scattered moderate aortic calcifications. Calcifications noted in the left main, left anterior descending and right coronary arteries. No evidence of aortic aneurysm. Heart is normal size. Mediastinum/Nodes: No mediastinal, hilar, or axillary adenopathy. Lungs/Pleura: Mild centrilobular emphysema. There are several small scattered solid and subsolid nodules throughout the lungs bilaterally. Most of which appear stable. Some are slightly less conspicuous. No new or enlarging pulmonary nodules. No confluent opacities or effusions. Upper Abdomen: Imaging into the upper abdomen shows no acute findings. Musculoskeletal: Chest wall soft tissues are unremarkable. No acute bony abnormality. IMPRESSION: Numerous bilateral solid and subsolid nodules within the lungs, 5 mm or less in size. These are stable or slightly improved since prior study. No new or enlarging pulmonary nodules. Favor a benign process, but recommend continued follow-up with repeat CT in 6-12 months. Moderate coronary artery disease with calcifications in the left main, left anterior descending and right coronary arteries. Aortic Atherosclerosis (ICD10-I70.0) and Emphysema (ICD10-J43.9). Electronically Signed   By: Rolm Baptise M.D.   On:  03/27/2018 09:20

## 2018-04-08 NOTE — Progress Notes (Signed)
cc'ed to pcp °

## 2018-04-08 NOTE — Telephone Encounter (Signed)
Called and informed pt of pre-op appt 05/17/18 at 8:00am. Letter mailed.

## 2018-04-08 NOTE — Patient Instructions (Signed)
1. Colonoscopy as scheduled.  Please see separate instructions. PLEASE MAKE SURE YOU ARE OFF ELIQUIS (BLOOD THINNER) AT LEAST TWO FULL DAYS BEFORE YOUR PROCEDURE.

## 2018-04-08 NOTE — Patient Instructions (Signed)
Wrote on printed TCS instructions for him to hold Eliquis for 48 hours prior to TCS scheduled for 05/23/18. Advised to start holding Eliquis 05/21/18. Verbalized understanding.

## 2018-05-16 ENCOUNTER — Encounter (HOSPITAL_COMMUNITY): Payer: Self-pay

## 2018-05-17 ENCOUNTER — Encounter (HOSPITAL_COMMUNITY)
Admission: RE | Admit: 2018-05-17 | Discharge: 2018-05-17 | Disposition: A | Discharge status: Home / Self Care | Payer: BLUE CROSS/BLUE SHIELD | Source: Ambulatory Visit | Attending: Internal Medicine | Admitting: Internal Medicine

## 2018-05-23 ENCOUNTER — Ambulatory Visit (HOSPITAL_COMMUNITY): Payer: BLUE CROSS/BLUE SHIELD | Admitting: Anesthesiology

## 2018-05-23 ENCOUNTER — Encounter (HOSPITAL_COMMUNITY): Payer: Self-pay | Admitting: *Deleted

## 2018-05-23 ENCOUNTER — Other Ambulatory Visit: Payer: Self-pay

## 2018-05-23 ENCOUNTER — Ambulatory Visit (HOSPITAL_COMMUNITY)
Admission: RE | Admit: 2018-05-23 | Discharge: 2018-05-23 | Disposition: A | Discharge status: Home / Self Care | Payer: BLUE CROSS/BLUE SHIELD | Source: Ambulatory Visit | Attending: Internal Medicine | Admitting: Internal Medicine

## 2018-05-23 ENCOUNTER — Encounter (HOSPITAL_COMMUNITY)
Admission: RE | Disposition: A | Discharge status: Home / Self Care | Payer: Self-pay | Source: Ambulatory Visit | Attending: Internal Medicine

## 2018-05-23 DIAGNOSIS — I4891 Unspecified atrial fibrillation: Secondary | ICD-10-CM | POA: Insufficient documentation

## 2018-05-23 DIAGNOSIS — F1021 Alcohol dependence, in remission: Secondary | ICD-10-CM | POA: Diagnosis not present

## 2018-05-23 DIAGNOSIS — K640 First degree hemorrhoids: Secondary | ICD-10-CM | POA: Diagnosis not present

## 2018-05-23 DIAGNOSIS — K635 Polyp of colon: Secondary | ICD-10-CM | POA: Diagnosis not present

## 2018-05-23 DIAGNOSIS — Z79899 Other long term (current) drug therapy: Secondary | ICD-10-CM | POA: Insufficient documentation

## 2018-05-23 DIAGNOSIS — Z1211 Encounter for screening for malignant neoplasm of colon: Secondary | ICD-10-CM | POA: Insufficient documentation

## 2018-05-23 DIAGNOSIS — D120 Benign neoplasm of cecum: Secondary | ICD-10-CM | POA: Diagnosis not present

## 2018-05-23 DIAGNOSIS — I4892 Unspecified atrial flutter: Secondary | ICD-10-CM | POA: Diagnosis not present

## 2018-05-23 DIAGNOSIS — F17210 Nicotine dependence, cigarettes, uncomplicated: Secondary | ICD-10-CM | POA: Insufficient documentation

## 2018-05-23 DIAGNOSIS — D124 Benign neoplasm of descending colon: Secondary | ICD-10-CM | POA: Diagnosis not present

## 2018-05-23 DIAGNOSIS — Z7901 Long term (current) use of anticoagulants: Secondary | ICD-10-CM | POA: Insufficient documentation

## 2018-05-23 HISTORY — PX: POLYPECTOMY: SHX5525

## 2018-05-23 HISTORY — PX: COLONOSCOPY WITH PROPOFOL: SHX5780

## 2018-05-23 SURGERY — COLONOSCOPY WITH PROPOFOL
Anesthesia: Monitor Anesthesia Care

## 2018-05-23 MED ORDER — PROPOFOL 10 MG/ML IV BOLUS
INTRAVENOUS | Status: DC | PRN
  Administered 2018-05-23 (×2): 40 mg via INTRAVENOUS
  Administered 2018-05-23: 60 mg via INTRAVENOUS
  Administered 2018-05-23: 40 mg via INTRAVENOUS

## 2018-05-23 MED ORDER — HYDROCODONE-ACETAMINOPHEN 7.5-325 MG PO TABS: 1.0000 | ORAL_TABLET | Freq: Once | ORAL | Status: DC | PRN

## 2018-05-23 MED ORDER — STERILE WATER FOR IRRIGATION IR SOLN
Status: DC | PRN
  Administered 2018-05-23: 100 mL

## 2018-05-23 MED ORDER — HYDROMORPHONE HCL 1 MG/ML IJ SOLN: 0.2500 mg | INTRAMUSCULAR | Status: DC | PRN

## 2018-05-23 MED ORDER — PROMETHAZINE HCL 25 MG/ML IJ SOLN: 6.2500 mg | INTRAMUSCULAR | Status: DC | PRN

## 2018-05-23 MED ORDER — PROPOFOL 10 MG/ML IV BOLUS
INTRAVENOUS | Status: AC
  Filled 2018-05-23: qty 40

## 2018-05-23 MED ORDER — CHLORHEXIDINE GLUCONATE CLOTH 2 % EX PADS: 6.0000 | MEDICATED_PAD | Freq: Once | CUTANEOUS | Status: DC

## 2018-05-23 MED ORDER — MEPERIDINE HCL 100 MG/ML IJ SOLN: 6.2500 mg | INTRAMUSCULAR | Status: DC | PRN

## 2018-05-23 MED ORDER — PROPOFOL 500 MG/50ML IV EMUL
INTRAVENOUS | Status: DC | PRN
  Administered 2018-05-23: 150 ug/kg/min via INTRAVENOUS
  Administered 2018-05-23: 200 ug/kg/min via INTRAVENOUS
  Administered 2018-05-23: 175 ug/kg/min via INTRAVENOUS

## 2018-05-23 MED ORDER — LACTATED RINGERS IV SOLN: INTRAVENOUS | Status: DC

## 2018-05-23 MED ORDER — LACTATED RINGERS IV SOLN
INTRAVENOUS | Status: DC
  Administered 2018-05-23: 08:00:00 via INTRAVENOUS

## 2018-05-23 NOTE — Discharge Instructions (Signed)
Colonoscopy Discharge Instructions  Read the instructions outlined below and refer to this sheet in the next few weeks. These discharge instructions provide you with general information on caring for yourself after you leave the hospital. Your doctor may also give you specific instructions. While your treatment has been planned according to the most current medical practices available, unavoidable complications occasionally occur. If you have any problems or questions after discharge, call Dr. Gala Romney at [phone]. ACTIVITY  You may resume your regular activity, but move at a slower pace for the next 24 hours.   Take frequent rest periods for the next 24 hours.   Walking will help get rid of the air and reduce the bloated feeling in your belly (abdomen).   No driving for 24 hours (because of the medicine (anesthesia) used during the test).    Do not sign any important legal documents or operate any machinery for 24 hours (because of the anesthesia used during the test).  NUTRITION  Drink plenty of fluids.   You may resume your normal diet as instructed by your doctor.   Begin with a light meal and progress to your normal diet. Heavy or fried foods are harder to digest and may make you feel sick to your stomach (nauseated).   Avoid alcoholic beverages for 24 hours or as instructed.  MEDICATIONS  You may resume your normal medications unless your doctor tells you otherwise.  WHAT YOU CAN EXPECT TODAY  Some feelings of bloating in the abdomen.   Passage of more gas than usual.   Spotting of blood in your stool or on the toilet paper.  IF YOU HAD POLYPS REMOVED DURING THE COLONOSCOPY:  No aspirin products for 7 days or as instructed.   No alcohol for 7 days or as instructed.   Eat a soft diet for the next 24 hours.  FINDING OUT THE RESULTS OF YOUR TEST Not all test results are available during your visit. If your test results are not back during the visit, make an appointment  with your caregiver to find out the results. Do not assume everything is normal if you have not heard from your caregiver or the medical facility. It is important for you to follow up on all of your test results.  SEEK IMMEDIATE MEDICAL ATTENTION IF:  You have more than a spotting of blood in your stool.   Your belly is swollen (abdominal distention).   You are nauseated or vomiting.   You have a temperature over 101.   You have abdominal pain or discomfort that is severe or gets worse throughout the day.    Colon polyp information provided  Further recommendations to follow pending review of pathology report     Colon Polyps  Polyps are tissue growths inside the body. Polyps can grow in many places, including the large intestine (colon). A polyp may be a round bump or a mushroom-shaped growth. You could have one polyp or several. Most colon polyps are noncancerous (benign). However, some colon polyps can become cancerous over time. Finding and removing the polyps early can help prevent this. What are the causes? The exact cause of colon polyps is not known. What increases the risk? You are more likely to develop this condition if you:  Have a family history of colon cancer or colon polyps.  Are older than 43 or older than 45 if you are African American.  Have inflammatory bowel disease, such as ulcerative colitis or Crohn's disease.  Have certain hereditary  conditions, such as: ? Familial adenomatous polyposis. ? Lynch syndrome. ? Turcot syndrome. ? Peutz-Jeghers syndrome.  Are overweight.  Smoke cigarettes.  Do not get enough exercise.  Drink too much alcohol.  Eat a diet that is high in fat and red meat and low in fiber.  Had childhood cancer that was treated with abdominal radiation. What are the signs or symptoms? Most polyps do not cause symptoms. If you have symptoms, they may include:  Blood coming from your rectum when having a bowel  movement.  Blood in your stool. The stool may look dark red or black.  Abdominal pain.  A change in bowel habits, such as constipation or diarrhea. How is this diagnosed? This condition is diagnosed with a colonoscopy. This is a procedure in which a lighted, flexible scope is inserted into the anus and then passed into the colon to examine the area. Polyps are sometimes found when a colonoscopy is done as part of routine cancer screening tests. How is this treated? Treatment for this condition involves removing any polyps that are found. Most polyps can be removed during a colonoscopy. Those polyps will then be tested for cancer. Additional treatment may be needed depending on the results of testing. Follow these instructions at home: Lifestyle  Maintain a healthy weight, or lose weight if recommended by your health care provider.  Exercise every day or as told by your health care provider.  Do not use any products that contain nicotine or tobacco, such as cigarettes and e-cigarettes. If you need help quitting, ask your health care provider.  If you drink alcohol, limit how much you have: ? 0-1 drink a day for women. ? 0-2 drinks a day for men.  Be aware of how much alcohol is in your drink. In the [place] one drink equals one 12 oz bottle of beer (355 mL), one 5 oz glass of wine (148 mL), or one 1 oz shot of hard liquor (44 mL). Eating and drinking   Eat foods that are high in fiber, such as fruits, vegetables, and whole grains.  Eat foods that are high in calcium and vitamin D, such as milk, cheese, yogurt, eggs, liver, fish, and broccoli.  Limit foods that are high in fat, such as fried foods and desserts.  Limit the amount of red meat and processed meat you eat, such as hot dogs, sausage, bacon, and lunch meats. General instructions  Keep all follow-up visits as told by your health care provider. This is important. ? This includes having regularly scheduled  colonoscopies. ? Talk to your health care provider about when you need a colonoscopy. Contact a health care provider if:  You have new or worsening bleeding during a bowel movement.  You have new or increased blood in your stool.  You have a change in bowel habits.  You lose weight for no known reason. Summary  Polyps are tissue growths inside the body. Polyps can grow in many places, including the colon.  Most colon polyps are noncancerous (benign), but some can become cancerous over time.  This condition is diagnosed with a colonoscopy.  Treatment for this condition involves removing any polyps that are found. Most polyps can be removed during a colonoscopy. This information is not intended to replace advice given to you by your health care provider. Make sure you discuss any questions you have with your health care provider. Document Released: 01/26/2004 Document Revised: 08/16/2017 Document Reviewed: 08/16/2017 Elsevier Interactive Patient Education  2019 [employer].  Monitored Anesthesia Care, Care After These instructions provide you with information about caring for yourself after your procedure. Your health care provider may also give you more specific instructions. Your treatment has been planned according to current medical practices, but problems sometimes occur. Call your health care provider if you have any problems or questions after your procedure. What can I expect after the procedure? After your procedure, you may:  Feel sleepy for several hours.  Feel clumsy and have poor balance for several hours.  Feel forgetful about what happened after the procedure.  Have poor judgment for several hours.  Feel nauseous or vomit.  Have a sore throat if you had a breathing tube during the procedure. Follow these instructions at home: For at least 24 hours after the procedure:      Have a responsible adult stay with you. It is important to have someone help  care for you until you are awake and alert.  Rest as needed.  Do not: ? Participate in activities in which you could fall or become injured. ? Drive. ? Use heavy machinery. ? Drink alcohol. ? Take sleeping pills or medicines that cause drowsiness. ? Make important decisions or sign legal documents. ? Take care of children on your own. Eating and drinking  Follow the diet that is recommended by your health care provider.  If you vomit, drink water, juice, or soup when you can drink without vomiting.  Make sure you have little or no nausea before eating solid foods. General instructions  Take over-the-counter and prescription medicines only as told by your health care provider.  If you have sleep apnea, surgery and certain medicines can increase your risk for breathing problems. Follow instructions from your health care provider about wearing your sleep device: ? Anytime you are sleeping, including during daytime naps. ? While taking prescription pain medicines, sleeping medicines, or medicines that make you drowsy.  If you smoke, do not smoke without supervision.  Keep all follow-up visits as told by your health care provider. This is important. Contact a health care provider if:  You keep feeling nauseous or you keep vomiting.  You feel light-headed.  You develop a rash.  You have a fever. Get help right away if:  You have trouble breathing. Summary  For several hours after your procedure, you may feel sleepy and have poor judgment.  Have a responsible adult stay with you for at least 24 hours or until you are awake and alert. This information is not intended to replace advice given to you by your health care provider. Make sure you discuss any questions you have with your health care provider. Document Released: 08/22/2015 Document Revised: 12/15/2016 Document Reviewed: 08/22/2015 Elsevier Interactive Patient Education  2019 [employer].

## 2018-05-23 NOTE — Anesthesia Procedure Notes (Signed)
Procedure Name: MAC Date/Time: 05/23/2018 8:57 AM Performed by: Vista Deck, CRNA Pre-anesthesia Checklist: Patient identified, Emergency Drugs available, Suction available, Timeout performed and Patient being monitored Patient Re-evaluated:Patient Re-evaluated prior to induction Oxygen Delivery Method: Nasal Cannula

## 2018-05-23 NOTE — Anesthesia Postprocedure Evaluation (Signed)
Anesthesia Post Note  Patient: Nicholas Avila  Procedure(s) Performed: COLONOSCOPY WITH PROPOFOL (N/A ) POLYPECTOMY  Patient location during evaluation: Short Stay Anesthesia Type: MAC Level of consciousness: awake and alert and patient cooperative Pain management: satisfactory to patient Vital Signs Assessment: post-procedure vital signs reviewed and stable Respiratory status: spontaneous breathing Cardiovascular status: stable Postop Assessment: no apparent nausea or vomiting Anesthetic complications: no     Last Vitals:  Vitals:   05/23/18 0939 05/23/18 0945  BP: 127/71 131/65  Pulse: (!) 56   Resp: 16 12  Temp: 36.8 C   SpO2: 95% 97%    Last Pain:  Vitals:   05/23/18 0945  TempSrc:   PainSc: 0-No pain                 Deslyn Cavenaugh

## 2018-05-23 NOTE — Progress Notes (Signed)
Nicholas Avila cannot drive, operate heavy machinery, sign legal documents or return to work until after 10:00am on May 24, 2018.

## 2018-05-23 NOTE — Transfer of Care (Signed)
Immediate Anesthesia Transfer of Care Note  Patient: Nicholas Avila  Procedure(s) Performed: COLONOSCOPY WITH PROPOFOL (N/A ) POLYPECTOMY  Patient Location: PACU  Anesthesia Type:MAC  Level of Consciousness: awake, alert  and patient cooperative  Airway & Oxygen Therapy: Patient Spontanous Breathing  Post-op Assessment: Report given to RN and Post -op Vital signs reviewed and stable  Post vital signs: Reviewed and stable  Last Vitals:  Vitals Value Taken Time  BP    Temp    Pulse    Resp 15 05/23/2018  9:38 AM  SpO2    Vitals shown include unvalidated device data.  Last Pain:  Vitals:   05/23/18 0930  TempSrc:   PainSc: 0-No pain      Patients Stated Pain Goal: 7 (49/67/59 1638)  Complications: No apparent anesthesia complications

## 2018-05-23 NOTE — Anesthesia Preprocedure Evaluation (Signed)
Anesthesia Evaluation    Airway Mallampati: II   Neck ROM: full    Dental  (+) Teeth Intact   Pulmonary Current Smoker,    breath sounds clear to auscultation       Cardiovascular Atrial Fibrillation and Ventricular Tachycardia  Rhythm:regular     Neuro/Psych PSYCHIATRIC DISORDERS    GI/Hepatic   Endo/Other    Renal/GU      Musculoskeletal   Abdominal   Peds  Hematology   Anesthesia Other Findings P-Afib, Aflutter, VT hx ETOH abuse, h/o withdrawal syndrome, remission? MJ and tobacco abuse States no other drug use States npo  Reproductive/Obstetrics                             Anesthesia Physical Anesthesia Plan  ASA: III  Anesthesia Plan: MAC   Post-op Pain Management:    Induction:   PONV Risk Score and Plan:   Airway Management Planned:   Additional Equipment:   Intra-op Plan:   Post-operative Plan:   Informed Consent: I have reviewed the patients History and Physical, chart, labs and discussed the procedure including the risks, benefits and alternatives for the proposed anesthesia with the patient or authorized representative who has indicated his/her understanding and acceptance.   Dental Advisory Given  Plan Discussed with: Anesthesiologist  Anesthesia Plan Comments:         Anesthesia Quick Evaluation

## 2018-05-23 NOTE — Op Note (Signed)
[facility] Patient Name: Nicholas Avila Procedure Date: 05/23/2018 8:50 AM MRN: [id number] Date of Birth: October 17, 1960 Attending MD: Norvel Richards , MD CSN: [id number] Age: 58 Admit Type: Outpatient Procedure:                Colonoscopy Indications:              Screening for colorectal malignant neoplasm Providers:                Norvel Richards, MD, Rosina Lowenstein, RN, Nelma Rothman, Technician Referring MD:              Medicines:                Propofol per Anesthesia Complications:            No immediate complications. Estimated Blood Loss:     Estimated blood loss: none. Procedure:                Pre-Anesthesia Assessment:                           - Prior to the procedure, a History and Physical                            was performed, and patient medications and                            allergies were reviewed. The patient's tolerance of                            previous anesthesia was also reviewed. The risks                            and benefits of the procedure and the sedation                            options and risks were discussed with the patient.                            All questions were answered, and informed consent                            was obtained. Prior Anticoagulants: The patient has                            taken no previous anticoagulant or antiplatelet                            agents. ASA Grade Assessment: II - A patient with                            mild systemic disease. After reviewing the risks  and benefits, the patient was deemed in                            satisfactory condition to undergo the procedure.                           After obtaining informed consent, the colonoscope                            was passed under direct vision. Throughout the                            procedure, the patient's blood pressure, pulse, and                            oxygen  saturations were monitored continuously. The                            CF-[id number] ([id number]) scope was introduced through                            the and advanced to the the cecum, identified by                            appendiceal orifice and ileocecal valve. The                            colonoscopy was performed without difficulty. The                            patient tolerated the procedure well. The quality                            of the bowel preparation was adequate. The                            ileocecal valve, appendiceal orifice, and rectum                            were photographed. The entire colon was well                            visualized. Scope In: 9:04:07 AM Scope Out: 9:29:44 AM Scope Withdrawal Time: 0 hours 21 minutes 52 seconds  Total Procedure Duration: 0 hours 25 minutes 37 seconds  Findings:      The perianal and digital rectal examinations were normal.      A 6 mm polyp was found in the descending colon. The polyp was sessile.       The polyp was removed with a hot snare. Resection and retrieval were       complete. Estimated blood loss: none. A single 2 x 2 centimeter sessile       polyp was found deep within the fold between the appendiceal orifice and       the proximal side of the ileocecal valve  seen only with deep cecal       intubation. The polyp was removed with a hot snare. There was nicely       lifted away from the cecal wall with eleoview - approximately mately 3.5       cc injected. The polyp was removed with a hot snare - picemeal.       Resection and retrieval were complete. Estimated blood loss: none.      Non-bleeding internal hemorrhoids were found during retroflexion. The       hemorrhoids were moderate, medium-sized and Grade I (internal       hemorrhoids that do not prolapse).      The exam was otherwise without abnormality on direct and retroflexion       views. Impression:               - One 6 mm polyp in the descending  colon, removed                            with a hot snare and SKIP. Resected and retrieved.                            Sessile cecal polyp removed as described above.                           - Non-bleeding internal hemorrhoids.                           - The examination was otherwise normal on direct                            and retroflexion views. Moderate Sedation:      Moderate (conscious) sedation was personally administered by an       anesthesia professional. The following parameters were monitored: oxygen       saturation, heart rate, blood pressure, respiratory rate, EKG, adequacy       of pulmonary ventilation, and response to care. Recommendation:           - Patient has a contact number available for                            emergencies. The signs and symptoms of potential                            delayed complications were discussed with the                            patient. Return to normal activities tomorrow.                            Written discharge instructions were provided to the                            patient.                           - Advance diet as tolerated.                           -  Continue present medications.                           - Repeat colonoscopy date to be determined after                            pending pathology results are reviewed for                            surveillance based on pathology results.                           - Return to GI office in 6 months. Procedure Code(s):        --- Professional ---                           [phone], Colonoscopy, flexible; with removal of                            tumor(s), polyp(s), or other lesion(s) by snare                            technique Diagnosis Code(s):        --- Professional ---                           Z12.11, Encounter for screening for malignant                            neoplasm of colon                           D12.4, Benign neoplasm of descending colon                            K64.0, First degree hemorrhoids CPT copyright 2018 [employer]. All rights reserved. The codes documented in this report are preliminary and upon coder review may  be revised to meet current compliance requirements. Cristopher Estimable. Alanys Godino, MD Norvel Richards, MD 05/23/2018 9:39:14 AM This report has been signed electronically. Number of Addenda: 0

## 2018-05-23 NOTE — H&P (Signed)
@LOGO @   Primary Care Physician:  Rosita Fire, MD Primary Gastroenterologist:  Dr. Gala Romney  Pre-Procedure History & Physical: HPI:  Nicholas Avila is a 58 y.o. male here for first ever screening colonoscopy.  No bowel symptoms.  No family history of colon cancer.  Eliquis stopped indefinitely 2 weeks ago.  Past Medical History:  Diagnosis Date  . Alcoholism in remission ([place])    quit 12/10/17. longest time alcohol free. reports multiple previous DUIs (04/08/18)  . Atrial flutter ([place])    a. diagnosed in 11/2017 during admission for Sepsis b. s/p successful DCCV in 01/2018.  Marland Kitchen Dysrhythmia    AFib  . Pulmonary nodules   . Tobacco use     Past Surgical History:  Procedure Laterality Date  . CARDIOVERSION N/A 01/24/2018   Procedure: CARDIOVERSION;  Surgeon: Arnoldo Lenis, MD;  [facility];  Service: Endoscopy;  Laterality: N/A;    Prior to Admission medications   Medication Sig Start Date End Date Taking? Authorizing Provider  apixaban (ELIQUIS) 5 MG TABS tablet Take 1 tablet (5 mg total) by mouth 2 (two) times daily. Patient not taking: Reported on 05/07/2018 01/16/18 05/07/18  Erma Heritage, PA-C  metoprolol succinate (TOPROL-XL) 50 MG 24 hr tablet Take 1 tablet (50 mg total) by mouth daily. Patient not taking: Reported on 05/07/2018 02/26/18   Erma Heritage, PA-C  polyethylene glycol-electrolytes (TRILYTE) 420 g solution Take 4,000 mLs by mouth as directed. 04/08/18   Daneil Dolin, MD    Allergies as of 04/08/2018  . (No Known Allergies)    Family History  Problem Relation Age of Onset  . Prostate cancer Father        late 29s  . Heart disease Maternal Aunt   . Colon cancer Neg Hx     Social History   Socioeconomic History  . Marital status: Single    Spouse name: Not on file  . Number of children: Not on file  . Years of education: Not on file  . Highest education level: Not on file  Occupational History  . Not on file  Social  Needs  . Financial resource strain: Not on file  . Food insecurity:    Worry: Patient refused    Inability: Patient refused  . Transportation needs:    Medical: Patient refused    Non-medical: Patient refused  Tobacco Use  . Smoking status: Current Every Day Smoker    Packs/day: 0.75    Years: 35.00    Pack years: 26.25    Types: Cigarettes  . Smokeless tobacco: Never Used  Substance and Sexual Activity  . Alcohol use: Not Currently    Comment: stopped 12/10/2017, prior heavy use  . Drug use: Yes    Types: Marijuana    Comment: last used 01/22/2018  . Sexual activity: Not Currently  Lifestyle  . Physical activity:    Days per week: Patient refused    Minutes per session: Patient refused  . Stress: Not at all  Relationships  . Social connections:    Talks on phone: Patient refused    Gets together: Patient refused    Attends religious service: Patient refused    Active member of club or organization: Patient refused    Attends meetings of clubs or organizations: Patient refused    Relationship status: Patient refused  . Intimate partner violence:    Fear of current or ex partner: Patient refused    Emotionally abused: Patient refused  Physically abused: Patient refused    Forced sexual activity: Patient refused  Other Topics Concern  . Not on file  Social History Narrative  . Not on file    Review of Systems: See HPI, otherwise negative ROS  Physical Exam: There were no vitals taken for this visit. General:   Alert,  Well-developed, well-nourished, pleasant and cooperative in NAD Neck:  Supple; no masses or thyromegaly. No significant cervical adenopathy. Lungs:  Clear throughout to auscultation.   No wheezes, crackles, or rhonchi. No acute distress. Heart:  Regular rate and rhythm; no murmurs, clicks, rubs,  or gallops. Abdomen: Non-distended, normal bowel sounds.  Soft and nontender without appreciable mass or hepatosplenomegaly.  Pulses:  Normal pulses  noted. Extremities:  Without clubbing or edema.  Impression/Plan: 58 year old gentleman here for first ever screening colonoscopy.The risks, benefits, limitations, alternatives and imponderables have been reviewed with the patient. Questions have been answered. All parties are agreeable.      Notice: This dictation was prepared with Dragon dictation along with smaller phrase technology. Any transcriptional errors that result from this process are unintentional and may not be corrected upon review.

## 2018-05-25 ENCOUNTER — Encounter: Payer: Self-pay | Admitting: Internal Medicine

## 2018-05-28 ENCOUNTER — Encounter (HOSPITAL_COMMUNITY): Payer: Self-pay | Admitting: Internal Medicine

## 2018-05-31 ENCOUNTER — Encounter: Payer: Self-pay | Admitting: Cardiology

## 2018-05-31 ENCOUNTER — Ambulatory Visit (INDEPENDENT_AMBULATORY_CARE_PROVIDER_SITE_OTHER): Payer: BLUE CROSS/BLUE SHIELD | Admitting: Cardiology

## 2018-05-31 VITALS — BP 123/66 | HR 70 | Ht 71.0 in | Wt 138.0 lb

## 2018-05-31 DIAGNOSIS — I4892 Unspecified atrial flutter: Secondary | ICD-10-CM | POA: Diagnosis not present

## 2018-05-31 NOTE — Progress Notes (Signed)
Clinical Summary [name] is a 58 y.o.male  1. Aflutter - new onset aflutter during admission 12/2017 in setting of sepsis and heavy EtOH use followed by withdrawal - succeful DCCV 01/24/18 - weaned AV nodal meds after cardioversion, low CHADS2Vasc score anticoag stopped after 1 month after DCCV   - no recent palpitations. Has been off his beta blocker since Rx expired.     2. Pulmonary nodules - stable by 03/2018 CT, needs repeat 1 year Past Medical History:  Diagnosis Date  . Alcoholism in remission (Nicholas Avila)    quit 12/10/17. longest time alcohol free. reports multiple previous DUIs (04/08/18)  . Atrial flutter ([place])    a. diagnosed in 11/2017 during admission for Sepsis b. s/p successful DCCV in 01/2018.  Marland Kitchen Dysrhythmia    AFib  . Pulmonary nodules   . Tobacco use      No Known Allergies   Current Outpatient Medications  Medication Sig Dispense Refill  . polyethylene glycol-electrolytes (TRILYTE) 420 g solution Take 4,000 mLs by mouth as directed. 4000 mL 0   No current facility-administered medications for this visit.      Past Surgical History:  Procedure Laterality Date  . CARDIOVERSION N/A 01/24/2018   Procedure: CARDIOVERSION;  Surgeon: Arnoldo Lenis, MD;  [facility];  Service: Endoscopy;  Laterality: N/A;  . COLONOSCOPY WITH PROPOFOL N/A 05/23/2018   Procedure: COLONOSCOPY WITH PROPOFOL;  Surgeon: Daneil Dolin, MD;  [facility];  Service: Endoscopy;  Laterality: N/A;  8:30am  . POLYPECTOMY  05/23/2018   Procedure: POLYPECTOMY;  Surgeon: Daneil Dolin, MD;  [facility];  Service: Endoscopy;;  cecal polyp, descending polyp     No Known Allergies    Family History  Problem Relation Age of Onset  . Prostate cancer Father        late 20s  . Heart disease Maternal Aunt   . Colon cancer Neg Hx      Social History [name] reports that he has been smoking cigarettes. He has a 26.25 pack-year smoking history.  He has never used smokeless tobacco. [name] reports previous alcohol use.   Review of Systems CONSTITUTIONAL: No weight loss, fever, chills, weakness or fatigue.  HEENT: Eyes: No visual loss, blurred vision, double vision or yellow sclerae.No hearing loss, sneezing, congestion, runny nose or sore throat.  SKIN: No rash or itching.  CARDIOVASCULAR: per hpi RESPIRATORY: No shortness of breath, cough or sputum.  GASTROINTESTINAL: No anorexia, nausea, vomiting or diarrhea. No abdominal pain or blood.  GENITOURINARY: No burning on urination, no polyuria NEUROLOGICAL: No headache, dizziness, syncope, paralysis, ataxia, numbness or tingling in the extremities. No change in bowel or bladder control.  MUSCULOSKELETAL: No muscle, back pain, joint pain or stiffness.  LYMPHATICS: No enlarged nodes. No history of splenectomy.  PSYCHIATRIC: No history of depression or anxiety.  ENDOCRINOLOGIC: No reports of sweating, cold or heat intolerance. No polyuria or polydipsia.  Marland Kitchen   Physical Examination Vitals:   05/31/18 0824  BP: 123/66  Pulse: 70  SpO2: 95%   Vitals:   05/31/18 0824  Weight: 138 lb (62.6 kg)  Height: 5\' 11"  (1.803 m)    Gen: resting comfortably, no acute distress HEENT: no scleral icterus, pupils equal round and reactive, no palptable cervical adenopathy,  CV: RRR, no m/r/g, no jvd Resp: Clear to auscultation bilaterally GI: abdomen is soft, non-tender, non-distended, normal bowel sounds, no hepatosplenomegaly MSK: extremities are warm, no edema.  Skin:  warm, no rash Neuro:  no focal deficits Psych: appropriate affect     Assessment and Plan  1. Aflutter - isolated episode in setting of sepsis and severe EtOH use/withdraw. S/p DCCV, no recurrence - low CHADS2Vasc score of 0, anticog stopped 1 month after DCCV - remains asymptomatic off beta blocker, he favors not being on meds at this time - continue to monitor for recurrence, at this time has done well 4 months s/p  DCCV without clear recurrence, hopefully main driving force was the sepsis and EtOH withdrawa, he contniues to avoid EtOH     F/u 6 months   Arnoldo Lenis, M.D.

## 2018-05-31 NOTE — Patient Instructions (Signed)
Medication Instructions:  Your physician recommends that you continue on your current medications as directed. Please refer to the Current Medication list given to you today.  If you need a refill on your cardiac medications before your next appointment, please call your pharmacy.   Lab work: NONE   If you have labs (blood work) drawn today and your tests are completely normal, you will receive your results only by: . MyChart Message (if you have MyChart) OR . A paper copy in the mail If you have any lab test that is abnormal or we need to change your treatment, we will call you to review the results.  Testing/Procedures: NONE   Follow-Up: At [facility], you and your health needs are our priority.  As part of our continuing mission to provide you with exceptional heart care, we have created designated Provider Care Teams.  These Care Teams include your primary Cardiologist (physician) and Advanced Practice Providers (APPs -  Physician Assistants and Nurse Practitioners) who all work together to provide you with the care you need, when you need it. You will need a follow up appointment in 6 months.  Please call our office 2 months in advance to schedule this appointment.  You may see Branch, Jonathan, MD or one of the following Advanced Practice Providers on your designated Care Team:   Brittany Strader, PA-C ([place] Office) . Michele Lenze, PA-C ([place] Office)  Any Other Special Instructions Will Be Listed Below (If Applicable). Thank you for choosing  [facility]!     

## 2018-06-10 ENCOUNTER — Telehealth: Payer: Self-pay | Admitting: Internal Medicine

## 2018-06-10 NOTE — Telephone Encounter (Signed)
Spoke with pt. He understands the letter that was sent by [facility] with his results. He is aware that closer to the 6 months, our office will set up a repeat TCS. If pt hasn't heard from our office in 6 months, he was asked to call back.

## 2018-06-10 NOTE — Telephone Encounter (Signed)
[phone]  Please call patient , he has questions about results from his procedure

## 2018-06-19 DIAGNOSIS — I480 Paroxysmal atrial fibrillation: Secondary | ICD-10-CM | POA: Diagnosis not present

## 2018-06-19 DIAGNOSIS — F5221 Male erectile disorder: Secondary | ICD-10-CM | POA: Diagnosis not present

## 2018-06-19 DIAGNOSIS — F17210 Nicotine dependence, cigarettes, uncomplicated: Secondary | ICD-10-CM | POA: Diagnosis not present

## 2018-06-19 DIAGNOSIS — J209 Acute bronchitis, unspecified: Secondary | ICD-10-CM | POA: Diagnosis not present

## 2018-11-11 ENCOUNTER — Encounter: Payer: Self-pay | Admitting: Internal Medicine

## 2019-04-14 ENCOUNTER — Telehealth: Payer: Self-pay

## 2019-04-14 NOTE — Telephone Encounter (Signed)

## 2019-04-16 ENCOUNTER — Encounter: Payer: Self-pay | Admitting: Cardiology

## 2019-04-16 ENCOUNTER — Telehealth (INDEPENDENT_AMBULATORY_CARE_PROVIDER_SITE_OTHER): Payer: BC Managed Care – PPO | Admitting: Cardiology

## 2019-04-16 VITALS — BP 147/99 | HR 93 | Ht 71.0 in | Wt 148.0 lb

## 2019-04-16 DIAGNOSIS — R911 Solitary pulmonary nodule: Secondary | ICD-10-CM

## 2019-04-16 DIAGNOSIS — R0602 Shortness of breath: Secondary | ICD-10-CM

## 2019-04-16 DIAGNOSIS — I4892 Unspecified atrial flutter: Secondary | ICD-10-CM

## 2019-04-16 DIAGNOSIS — R918 Other nonspecific abnormal finding of lung field: Secondary | ICD-10-CM

## 2019-04-16 DIAGNOSIS — R030 Elevated blood-pressure reading, without diagnosis of hypertension: Secondary | ICD-10-CM

## 2019-04-16 NOTE — Patient Instructions (Signed)
Medication Instructions:  Your physician recommends that you continue on your current medications as directed. Please refer to the Current Medication list given to you today.   Labwork: DAY BEFORE CHEST CT  BMET  Testing/Procedures: Non-Cardiac CT scanning, (CAT scanning), is a noninvasive, special x-ray that produces cross-sectional images of the body using x-rays and a computer. CT scans help physicians diagnose and treat medical conditions. For some CT exams, a contrast material is used to enhance visibility in the area of the body being studied. CT scans provide greater clarity and reveal more details than regular x-ray exams.  CT OF CHEST   Follow-Up: Your physician recommends that you schedule a follow-up appointment in: [place] [facility]  Your physician wants you to follow-up in: 1 YEAR.  You will receive a reminder letter in the mail two months in advance. If you don't receive a letter, please call our office to schedule the follow-up appointment.    Any Other Special Instructions Will Be Listed Below (If Applicable).     If you need a refill on your cardiac medications before your next appointment, please call your pharmacy.

## 2019-04-16 NOTE — Progress Notes (Signed)
Virtual Visit via Telephone Note   This visit type was conducted due to national recommendations for restrictions regarding the COVID-19 Pandemic (e.g. social distancing) in an effort to limit this patient's exposure and mitigate transmission in our community.  Due to his co-morbid illnesses, this patient is at least at moderate risk for complications without adequate follow up.  This format is felt to be most appropriate for this patient at this time.  The patient did not have access to video technology/had technical difficulties with video requiring transitioning to audio format only (telephone).  All issues noted in this document were discussed and addressed.  No physical exam could be performed with this format.  Please refer to the patient's chart for his  consent to telehealth for [facility].   Date:  04/16/2019   ID:  Nicholas Avila, DOB 12/23/1960, MRN [id number]  Patient Location: Home Provider Location: Home  PCP:  Rosita Fire, MD  Cardiologist:  Carlyle Dolly, MD  Electrophysiologist:  None   Evaluation Performed:  Follow-Up Visit  Chief Complaint:  Follow up  History of Present Illness:    Nicholas Avila is a 58 y.o. male with seen today for follow up of the following medical problems.   1. Aflutter - new onset aflutter during admission 12/2017 in setting of sepsis and heavy EtOH use followed by withdrawal - succeful DCCV 01/24/18 - weaned AV nodal meds after cardioversion, low CHADS2Vasc score anticoag stopped after 1 month after DCCV   - no recent palpitaitons   2. Pulmonary nodules - stable by 03/2018 CT - no recent pulmonary symptoms.    The patient does not have symptoms concerning for COVID-19 infection (fever, chills, cough, or new shortness of breath).    Past Medical History:  Diagnosis Date  . Alcoholism in remission ([place])    quit 12/10/17. longest time alcohol free. reports multiple previous DUIs (04/08/18)  . Atrial flutter ([place])    a.  diagnosed in 11/2017 during admission for Sepsis b. s/p successful DCCV in 01/2018.  Marland Kitchen Dysrhythmia    AFib  . Pulmonary nodules   . Tobacco use    Past Surgical History:  Procedure Laterality Date  . CARDIOVERSION N/A 01/24/2018   Procedure: CARDIOVERSION;  Surgeon: Arnoldo Lenis, MD;  [facility];  Service: Endoscopy;  Laterality: N/A;  . COLONOSCOPY WITH PROPOFOL N/A 05/23/2018   Procedure: COLONOSCOPY WITH PROPOFOL;  Surgeon: Daneil Dolin, MD;  [facility];  Service: Endoscopy;  Laterality: N/A;  8:30am  . POLYPECTOMY  05/23/2018   Procedure: POLYPECTOMY;  Surgeon: Daneil Dolin, MD;  [facility];  Service: Endoscopy;;  cecal polyp, descending polyp     No outpatient medications have been marked as taking for the 04/16/19 encounter (Appointment) with Arnoldo Lenis, MD.     Allergies:   Patient has no known allergies.   Social History   Tobacco Use  . Smoking status: Current Every Day Smoker    Packs/day: 0.75    Years: 35.00    Pack years: 26.25    Types: Cigarettes  . Smokeless tobacco: Never Used  Substance Use Topics  . Alcohol use: Not Currently    Comment: stopped 12/10/2017, prior heavy use  . Drug use: Yes    Types: Marijuana    Comment: last used 01/22/2018     Family Hx: The patient's family history includes Heart disease in his maternal aunt; Prostate cancer in his father. There is no history  of Colon cancer.  ROS:   Please see the history of present illness.     All other systems reviewed and are negative.   Prior CV studies:   The following studies were reviewed today:   Labs/Other Tests and Data Reviewed:    EKG:  No ECG reviewed.  Recent Labs: No results found for requested labs within last 8760 hours.   Recent Lipid Panel No results found for: CHOL, TRIG, HDL, CHOLHDL, LDLCALC, LDLDIRECT  Wt Readings from Last 3 Encounters:  05/31/18 138 lb (62.6 kg)  05/23/18 147 lb (66.7 kg)  04/08/18 140 lb  9.6 oz (63.8 kg)     Objective:    Vital Signs:   Today's Vitals   04/16/19 0923  BP: (!) 147/99  Pulse: 93  Weight: 148 lb (67.1 kg)  Height: 5\' 11"  (1.803 m)   Body mass index is 20.64 kg/m. Normal affect. Normal speech pattern and tone. Comfortable, no apparent distress. No audible signs of SOB or wheezing.   ASSESSMENT & PLAN:    1. Aflutter - isolated episode in setting of sepsis and severe EtOH use/withdraw. S/p DCCV, no recurrence - low CHADS2Vasc score of 0, anticog stopped 1 month after DCCV - no recent symptom to suggest recurrence, continue to monitor  2. Elevated blood pressure - home bp elevated, at our last clinic visit was at goal - he will come to clinic for a nursing bp check, also obtain EKG at that time to check still in SR  3. Pulmonary nodules -repeat CT chest without contrast  COVID-19 Education: The signs and symptoms of COVID-19 were discussed with the patient and how to seek care for testing (follow up with PCP or arrange E-visit).  The importance of social distancing was discussed today.  Time:   Today, I have spent 22 minutes with the patient with telehealth technology discussing the above problems.     Medication Adjustments/Labs and Tests Ordered: Current medicines are reviewed at length with the patient today.  Concerns regarding medicines are outlined above.   Tests Ordered: No orders of the defined types were placed in this encounter.   Medication Changes: No orders of the defined types were placed in this encounter.   Follow Up:  In Person in 1 year(s)  Signed, Carlyle Dolly, MD  04/16/2019 8:47 AM    [place]

## 2019-04-16 NOTE — Addendum Note (Signed)
Addended byDebbora Lacrosse R on: 04/16/2019 11:09 AM   Modules accepted: Orders

## 2019-04-17 ENCOUNTER — Telehealth: Payer: Self-pay

## 2019-04-17 DIAGNOSIS — R0602 Shortness of breath: Secondary | ICD-10-CM

## 2019-04-17 DIAGNOSIS — R911 Solitary pulmonary nodule: Secondary | ICD-10-CM

## 2019-04-17 NOTE — Telephone Encounter (Signed)
Placed new order

## 2019-04-17 NOTE — Telephone Encounter (Signed)
-----   Message from Arnoldo Lenis, MD sent at 04/17/2019 12:52 PM EST ----- Clinic patient from yesterday, CT chest should be without contrast. Can we change order   Zandra Abts MD

## 2019-05-02 ENCOUNTER — Ambulatory Visit (INDEPENDENT_AMBULATORY_CARE_PROVIDER_SITE_OTHER): Payer: BC Managed Care – PPO | Admitting: *Deleted

## 2019-05-02 ENCOUNTER — Ambulatory Visit (HOSPITAL_COMMUNITY)
Admission: RE | Admit: 2019-05-02 | Discharge: 2019-05-02 | Disposition: A | Discharge status: Home / Self Care | Payer: BC Managed Care – PPO | Source: Ambulatory Visit | Attending: Cardiology | Admitting: Cardiology

## 2019-05-02 ENCOUNTER — Other Ambulatory Visit: Payer: Self-pay

## 2019-05-02 VITALS — BP 127/81 | HR 74 | Temp 97.5°F | Ht 71.0 in | Wt 132.6 lb

## 2019-05-02 DIAGNOSIS — J432 Centrilobular emphysema: Secondary | ICD-10-CM | POA: Diagnosis not present

## 2019-05-02 DIAGNOSIS — I4891 Unspecified atrial fibrillation: Secondary | ICD-10-CM | POA: Diagnosis not present

## 2019-05-02 DIAGNOSIS — R0602 Shortness of breath: Secondary | ICD-10-CM

## 2019-05-02 DIAGNOSIS — R911 Solitary pulmonary nodule: Secondary | ICD-10-CM | POA: Insufficient documentation

## 2019-05-02 IMAGING — CT CT CHEST W/O CM
2 of 4 series · 15 of 36 positions shown, 18 images · non-contrast
Comparison: [DATE], [DATE]

CLINICAL DATA: Follow-up pulmonary nodule

EXAM:
CT CHEST WITHOUT CONTRAST
TECHNIQUE: Multidetector CT imaging of the chest was performed following the
standard protocol without IV contrast.

[Series 3: routine chest without · axial · non-contrast · 0.62mm/px · z∈[+1380,+1656]mm · 12 of 164 slices shown, 15 images]
[im 13/164  mediastinal]
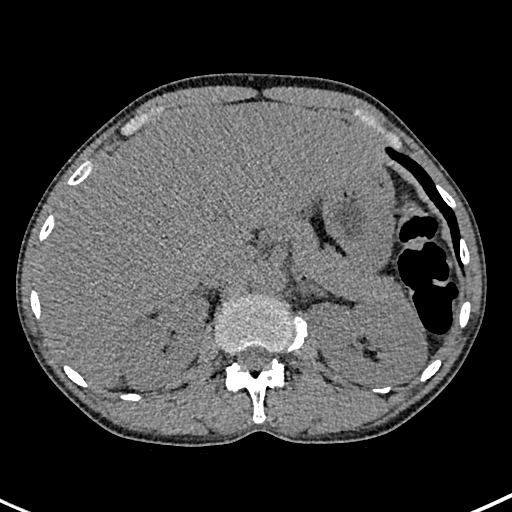
[im 13/164  lung]
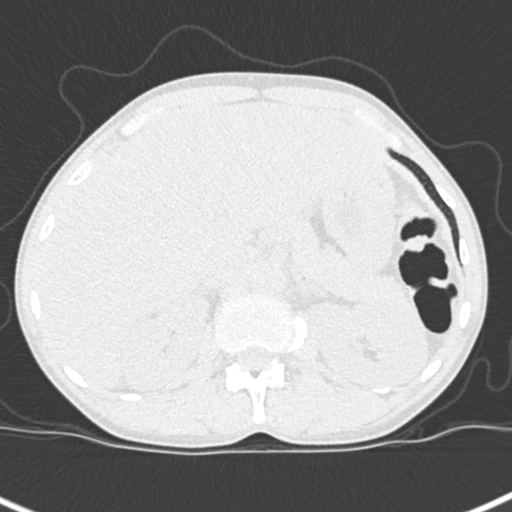
[im 26/164  lung]
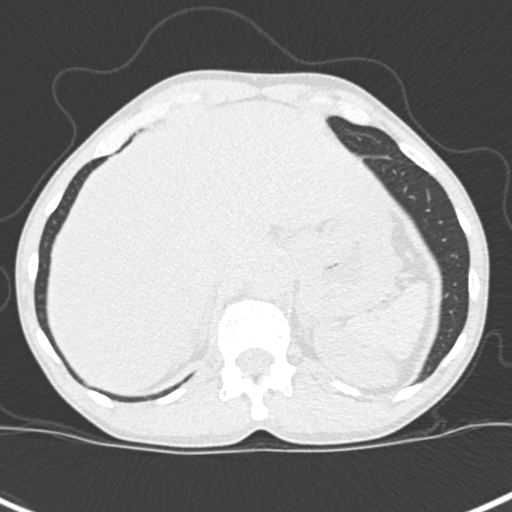
[im 38/164  lung]
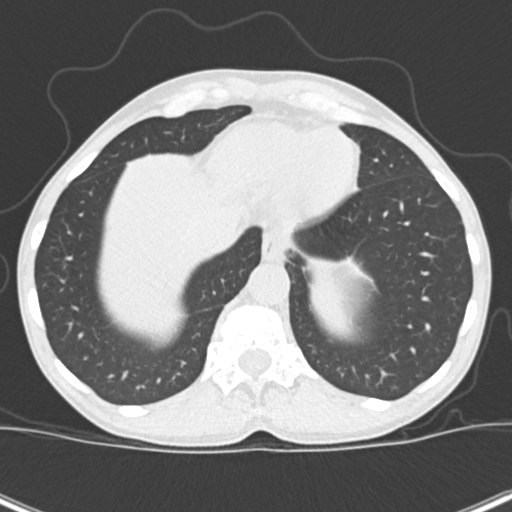
[im 51/164  lung]
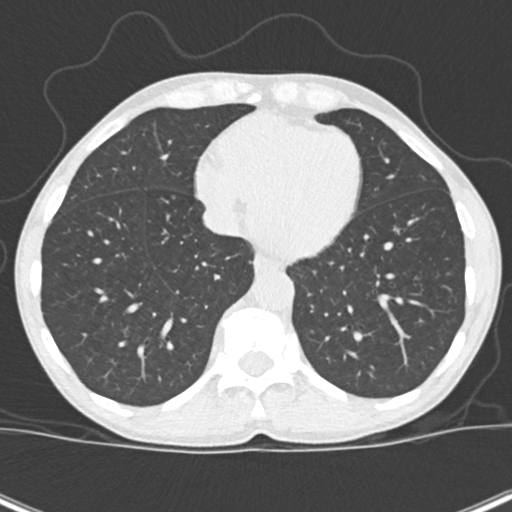
[im 63/164  mediastinal]
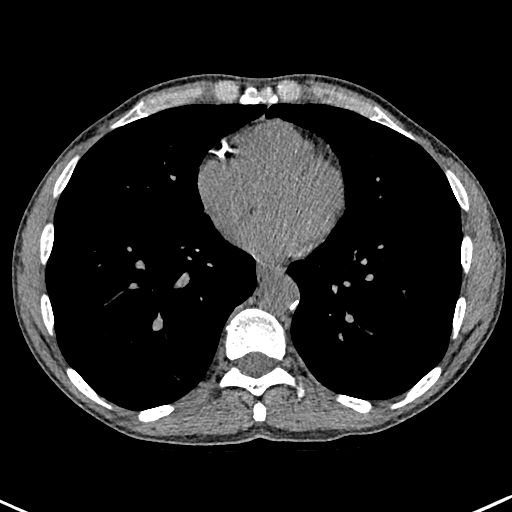
[im 63/164  lung]
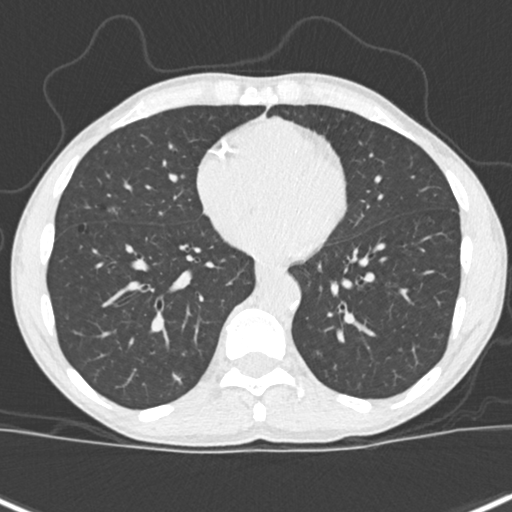
[im 76/164  lung]
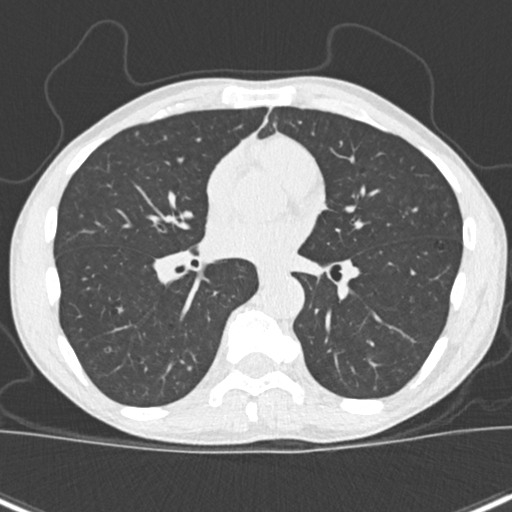
[im 88/164  lung]
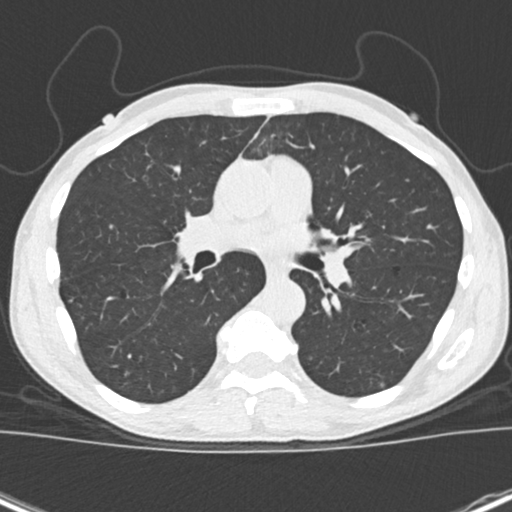
[im 101/164  lung]
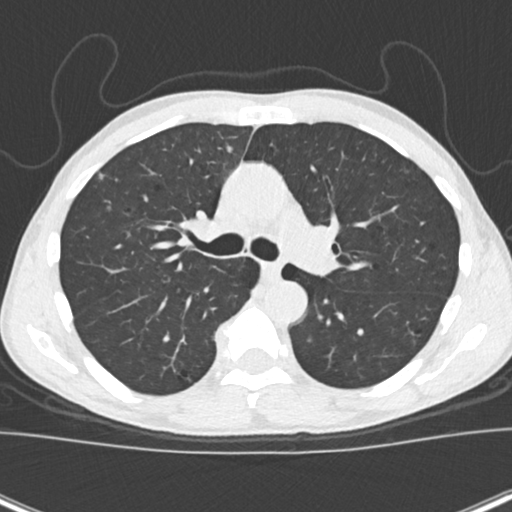
[im 113/164  mediastinal]
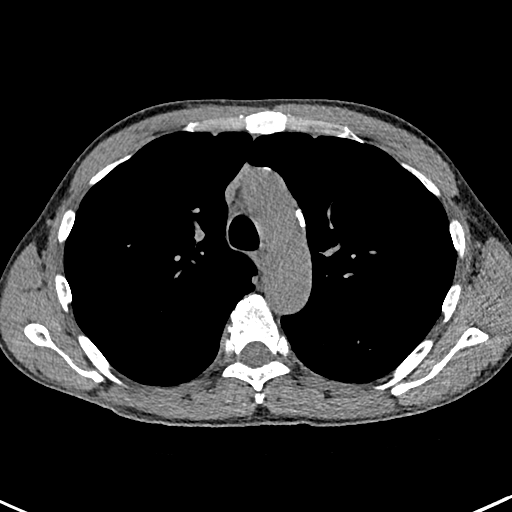
[im 113/164  lung]
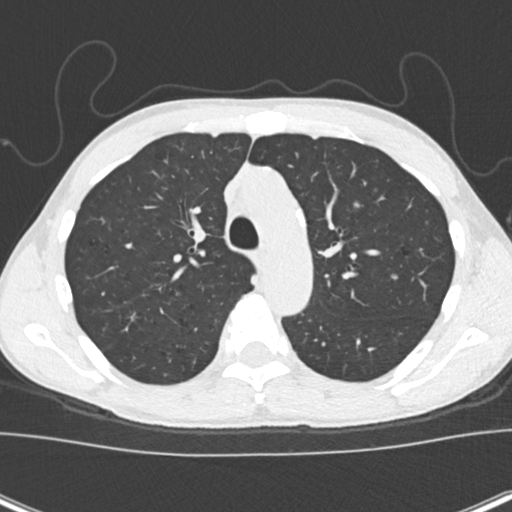
[im 126/164  lung]
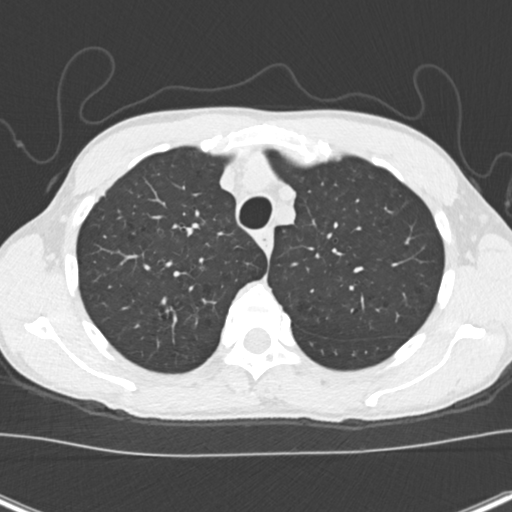
[im 138/164  lung]
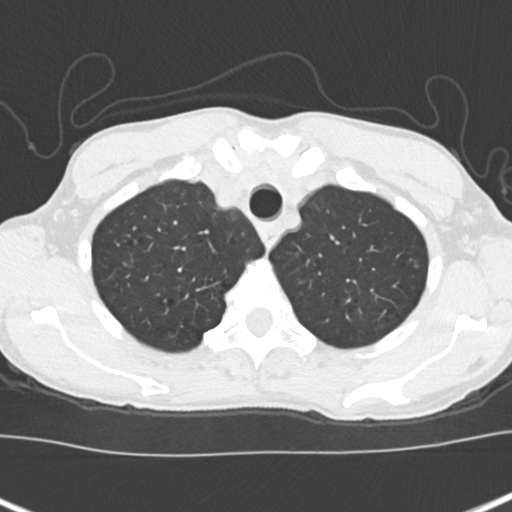
[im 151/164  lung]
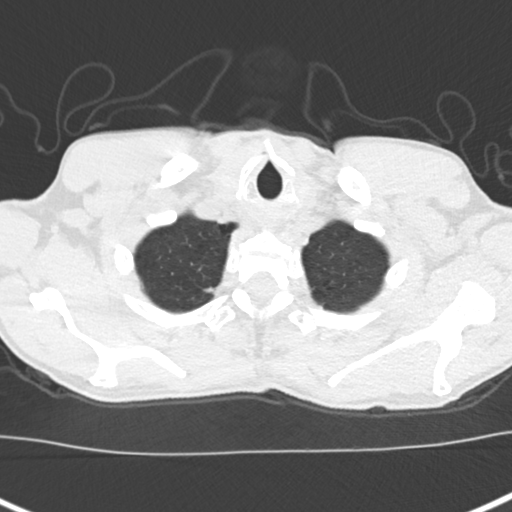

[Series 6: coronal · coronal · 0.62mm/px · 3 of 124 slices shown]
[im 25/124  lung]
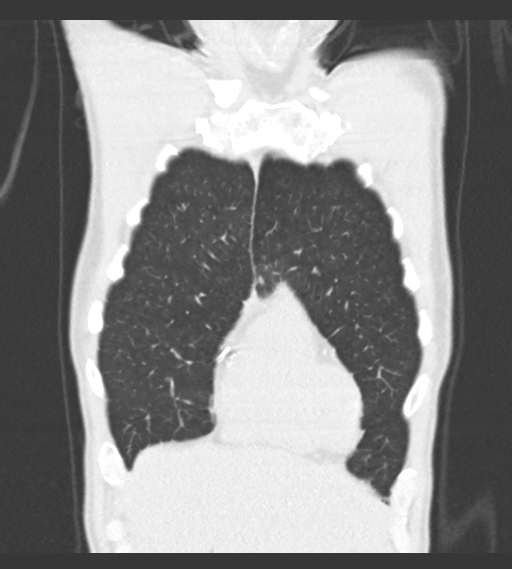
[im 50/124  lung]
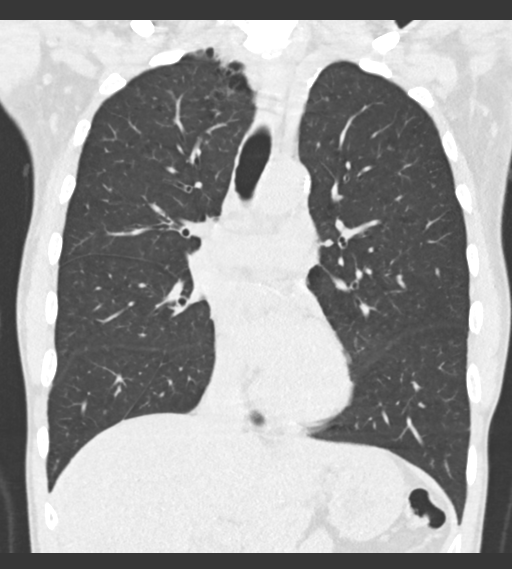
[im 74/124  lung]
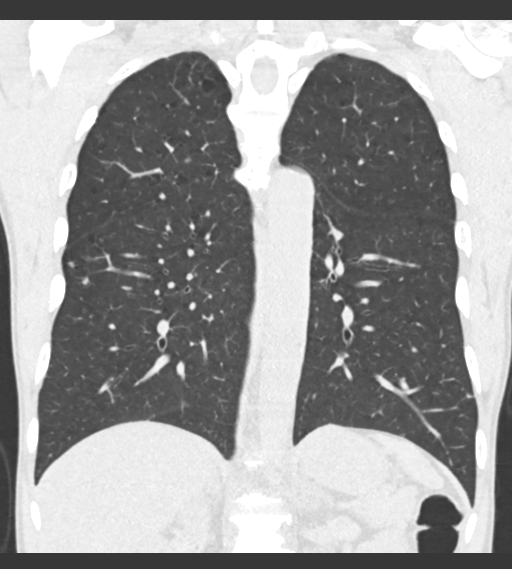

[15 of 36 positions shown; findings below may reference images not displayed]

FINDINGS: Cardiovascular: Normal heart size. No pericardial effusion. Thoracic
aorta is nonaneurysmal. Atherosclerotic calcifications of the aorta
and coronary arteries. Main pulmonary trunk is not dilated.

Mediastinum/Nodes: No enlarged mediastinal or axillary lymph nodes.
Thyroid gland, trachea, and esophagus demonstrate no significant
findings.

Lungs/Pleura: Mild centrilobular emphysema. Redemonstration multiple
small scattered solid and subsolid pulmonary nodules throughout the
bilateral lungs. No enlarging nodule. There are a few new scattered
primarily subsolid nodules including 4 mm left upper lobe nodules
(series 5, image 36 and 52) and 4 mm inferior right upper lobe
nodule (series 5, image 71). Additional 5 mm juxtapleural nodule
within the right lower lobe adjacent to the diaphragm (series 5,
image 122), also new from prior. No focal airspace consolidation,
pleural effusion, or pneumothorax.

Upper Abdomen: No acute abnormality.

Musculoskeletal: No chest wall mass or suspicious bone lesions
identified.
IMPRESSION: 1. Redemonstration of multiple small scattered solid and subsolid
pulmonary nodules throughout the bilateral lungs, with a few new
scattered primarily subsolid nodules, largest measuring up to 5 mm
in the right lower lobe. Findings could reflect ongoing infectious
or inflammatory process versus developing smoking related
interstitial lung disease such as respiratory bronchiolitis.
2. Emphysema and aortic atherosclerosis.

Aortic Atherosclerosis ([K9]-[K9]) and Emphysema ([K9]-[K9]).

## 2019-05-06 ENCOUNTER — Telehealth: Payer: Self-pay

## 2019-05-06 NOTE — Telephone Encounter (Signed)
PT made aware. Voiced understanding. Copy to pcp.

## 2019-05-06 NOTE — Telephone Encounter (Signed)
-----   Message from Massie Maroon, [place] sent at 05/05/2019  9:43 AM EST -----  ----- Message ----- From: Herminio Commons, MD Sent: 05/05/2019   9:39 AM EST To: Massie Maroon, CMA  Multiple pulmonary nodules again noted.  He also appears to have emphysema.  Follow-up with Dr. Harl Bowie as scheduled.

## 2019-05-06 NOTE — Telephone Encounter (Signed)
-----   Message from Massie Maroon, [place] sent at 05/05/2019  9:43 AM EST -----  ----- Message ----- From: Herminio Commons, MD Sent: 05/05/2019   9:39 AM EST To: Massie Maroon, CMA  Multiple pulmonary nodules again noted.  He also appears to have emphysema.  Follow-up with Dr. Harl Bowie as scheduled.

## 2019-05-06 NOTE — Telephone Encounter (Signed)
-----   Message from Massie Maroon, [place] sent at 05/05/2019  9:43 AM EST -----  ----- Message ----- From: Herminio Commons, MD Sent: 05/05/2019   9:39 AM EST To: Massie Maroon, CMA  Multiple pulmonary nodules again noted.  He also appears to have emphysema.  Follow-up with Dr. Harl Bowie as scheduled.

## 2019-05-06 NOTE — Telephone Encounter (Signed)
Called pt. No answer, mailbox full.

## 2019-08-05 DIAGNOSIS — Z0001 Encounter for general adult medical examination with abnormal findings: Secondary | ICD-10-CM | POA: Diagnosis not present

## 2019-08-05 DIAGNOSIS — F17200 Nicotine dependence, unspecified, uncomplicated: Secondary | ICD-10-CM | POA: Diagnosis not present

## 2019-08-05 DIAGNOSIS — F17210 Nicotine dependence, cigarettes, uncomplicated: Secondary | ICD-10-CM | POA: Diagnosis not present

## 2019-08-05 DIAGNOSIS — J42 Unspecified chronic bronchitis: Secondary | ICD-10-CM | POA: Diagnosis not present

## 2019-08-20 DIAGNOSIS — Z0001 Encounter for general adult medical examination with abnormal findings: Secondary | ICD-10-CM | POA: Diagnosis not present

## 2019-08-20 DIAGNOSIS — I482 Chronic atrial fibrillation, unspecified: Secondary | ICD-10-CM | POA: Diagnosis not present

## 2019-08-26 DIAGNOSIS — E7800 Pure hypercholesterolemia, unspecified: Secondary | ICD-10-CM | POA: Diagnosis not present

## 2019-08-26 DIAGNOSIS — F1020 Alcohol dependence, uncomplicated: Secondary | ICD-10-CM | POA: Diagnosis not present

## 2019-08-26 DIAGNOSIS — I10 Essential (primary) hypertension: Secondary | ICD-10-CM | POA: Diagnosis not present

## 2019-08-26 DIAGNOSIS — R945 Abnormal results of liver function studies: Secondary | ICD-10-CM | POA: Diagnosis not present

## 2019-10-16 DIAGNOSIS — J309 Allergic rhinitis, unspecified: Secondary | ICD-10-CM | POA: Diagnosis not present

## 2019-10-16 DIAGNOSIS — F5221 Male erectile disorder: Secondary | ICD-10-CM | POA: Diagnosis not present

## 2020-05-05 DIAGNOSIS — L089 Local infection of the skin and subcutaneous tissue, unspecified: Secondary | ICD-10-CM | POA: Diagnosis not present

## 2020-05-05 DIAGNOSIS — E785 Hyperlipidemia, unspecified: Secondary | ICD-10-CM | POA: Diagnosis not present

## 2020-05-11 ENCOUNTER — Other Ambulatory Visit: Payer: Self-pay | Admitting: Internal Medicine

## 2020-05-11 DIAGNOSIS — D1432 Benign neoplasm of left bronchus and lung: Secondary | ICD-10-CM

## 2020-06-02 ENCOUNTER — Ambulatory Visit (HOSPITAL_COMMUNITY): Payer: BC Managed Care – PPO

## 2020-06-02 ENCOUNTER — Encounter (HOSPITAL_COMMUNITY): Payer: Self-pay

## 2020-06-08 ENCOUNTER — Other Ambulatory Visit: Payer: Self-pay

## 2020-06-08 ENCOUNTER — Other Ambulatory Visit: Payer: BC Managed Care – PPO

## 2020-06-08 DIAGNOSIS — Z20822 Contact with and (suspected) exposure to covid-19: Secondary | ICD-10-CM | POA: Diagnosis not present

## 2020-06-09 LAB — SARS-COV-2, NAA 2 DAY TAT

## 2020-06-09 LAB — NOVEL CORONAVIRUS, NAA: SARS-CoV-2, NAA: DETECTED — AB

## 2020-06-15 ENCOUNTER — Other Ambulatory Visit: Payer: BC Managed Care – PPO

## 2020-06-15 DIAGNOSIS — Z20822 Contact with and (suspected) exposure to covid-19: Secondary | ICD-10-CM | POA: Diagnosis not present

## 2020-06-16 LAB — NOVEL CORONAVIRUS, NAA: SARS-CoV-2, NAA: NOT DETECTED

## 2020-06-16 LAB — SARS-COV-2, NAA 2 DAY TAT

## 2020-07-27 ENCOUNTER — Other Ambulatory Visit (HOSPITAL_COMMUNITY): Payer: Self-pay | Admitting: Gerontology

## 2020-07-27 ENCOUNTER — Other Ambulatory Visit: Payer: Self-pay

## 2020-07-27 ENCOUNTER — Ambulatory Visit (HOSPITAL_COMMUNITY)
Admission: RE | Admit: 2020-07-27 | Discharge: 2020-07-27 | Disposition: A | Discharge status: Home / Self Care | Payer: BC Managed Care – PPO | Source: Ambulatory Visit | Attending: Gerontology | Admitting: Gerontology

## 2020-07-27 ENCOUNTER — Other Ambulatory Visit (HOSPITAL_COMMUNITY)
Admission: RE | Admit: 2020-07-27 | Discharge: 2020-07-27 | Disposition: A | Discharge status: Home / Self Care | Payer: BC Managed Care – PPO | Source: Ambulatory Visit | Attending: Internal Medicine | Admitting: Internal Medicine

## 2020-07-27 DIAGNOSIS — E785 Hyperlipidemia, unspecified: Secondary | ICD-10-CM | POA: Insufficient documentation

## 2020-07-27 DIAGNOSIS — R739 Hyperglycemia, unspecified: Secondary | ICD-10-CM | POA: Insufficient documentation

## 2020-07-27 DIAGNOSIS — Z0001 Encounter for general adult medical examination with abnormal findings: Secondary | ICD-10-CM | POA: Insufficient documentation

## 2020-07-27 DIAGNOSIS — F1010 Alcohol abuse, uncomplicated: Secondary | ICD-10-CM | POA: Diagnosis not present

## 2020-07-27 DIAGNOSIS — R059 Cough, unspecified: Secondary | ICD-10-CM | POA: Insufficient documentation

## 2020-07-27 DIAGNOSIS — I4892 Unspecified atrial flutter: Secondary | ICD-10-CM | POA: Diagnosis not present

## 2020-07-27 DIAGNOSIS — F17210 Nicotine dependence, cigarettes, uncomplicated: Secondary | ICD-10-CM | POA: Diagnosis not present

## 2020-07-27 DIAGNOSIS — R0602 Shortness of breath: Secondary | ICD-10-CM | POA: Diagnosis not present

## 2020-07-27 DIAGNOSIS — R911 Solitary pulmonary nodule: Secondary | ICD-10-CM | POA: Diagnosis not present

## 2020-07-27 DIAGNOSIS — I6521 Occlusion and stenosis of right carotid artery: Secondary | ICD-10-CM | POA: Insufficient documentation

## 2020-07-27 LAB — CBC WITH DIFFERENTIAL/PLATELET
Abs Immature Granulocytes: 0.03 10*3/uL (ref 0.00–0.07)
Basophils Absolute: 0.1 10*3/uL (ref 0.0–0.1)
Basophils Relative: 2 %
Eosinophils Absolute: 0.1 10*3/uL (ref 0.0–0.5)
Eosinophils Relative: 1 %
HCT: 35.7 % — ABNORMAL LOW (ref 39.0–52.0)
Hemoglobin: 12.4 g/dL — ABNORMAL LOW (ref 13.0–17.0)
Immature Granulocytes: 0 %
Lymphocytes Relative: 16 %
Lymphs Abs: 1.1 10*3/uL (ref 0.7–4.0)
MCH: 31.6 pg (ref 26.0–34.0)
MCHC: 34.7 g/dL (ref 30.0–36.0)
MCV: 91.1 fL (ref 80.0–100.0)
Monocytes Absolute: 0.9 10*3/uL (ref 0.1–1.0)
Monocytes Relative: 13 %
Neutro Abs: 4.7 10*3/uL (ref 1.7–7.7)
Neutrophils Relative %: 68 %
Platelets: 130 10*3/uL — ABNORMAL LOW (ref 150–400)
RBC: 3.92 MIL/uL — ABNORMAL LOW (ref 4.22–5.81)
RDW: 16.3 % — ABNORMAL HIGH (ref 11.5–15.5)
WBC: 6.9 10*3/uL (ref 4.0–10.5)
nRBC: 0 % (ref 0.0–0.2)

## 2020-07-27 LAB — HEPATIC FUNCTION PANEL
ALT: 42 U/L (ref 0–44)
AST: 75 U/L — ABNORMAL HIGH (ref 15–41)
Albumin: 3.9 g/dL (ref 3.5–5.0)
Alkaline Phosphatase: 170 U/L — ABNORMAL HIGH (ref 38–126)
Bilirubin, Direct: 0.1 mg/dL (ref 0.0–0.2)
Indirect Bilirubin: 0.5 mg/dL (ref 0.3–0.9)
Total Bilirubin: 0.6 mg/dL (ref 0.3–1.2)
Total Protein: 8.6 g/dL — ABNORMAL HIGH (ref 6.5–8.1)

## 2020-07-27 LAB — BASIC METABOLIC PANEL
Anion gap: 17 — ABNORMAL HIGH (ref 5–15)
BUN: <5 mg/dL — ABNORMAL LOW (ref 6–20)
CO2: 21 mmol/L — ABNORMAL LOW (ref 22–32)
Calcium: 9 mg/dL (ref 8.9–10.3)
Chloride: 95 mmol/L — ABNORMAL LOW (ref 98–111)
Creatinine, Ser: 0.4 mg/dL — ABNORMAL LOW (ref 0.61–1.24)
GFR, Estimated: >60 mL/min (ref >60)
Glucose, Bld: 97 mg/dL (ref 70–99)
Potassium: 3.6 mmol/L (ref 3.5–5.1)
Sodium: 133 mmol/L — ABNORMAL LOW (ref 135–145)

## 2020-07-27 LAB — LIPID PANEL
Cholesterol: 272 mg/dL — ABNORMAL HIGH (ref 0–200)
HDL: 106 mg/dL (ref >40)
LDL Cholesterol: 149 mg/dL — ABNORMAL HIGH (ref 0–99)
Total CHOL/HDL Ratio: 2.6 RATIO
Triglycerides: 83 mg/dL (ref <150)
VLDL: 17 mg/dL (ref 0–40)

## 2020-07-27 LAB — HEMOGLOBIN A1C
Hgb A1c MFr Bld: 5.4 % (ref 4.8–5.6)
Mean Plasma Glucose: 108.28 mg/dL

## 2020-07-27 IMAGING — DX DG CHEST 2V
2 series · 2 of 2 positions shown · non-contrast
Comparison: [DATE] chest radiograph; chest CT [DATE]

CLINICAL DATA: Cough and shortness of breath

EXAM:
CHEST - 2 VIEW

[chest pa]
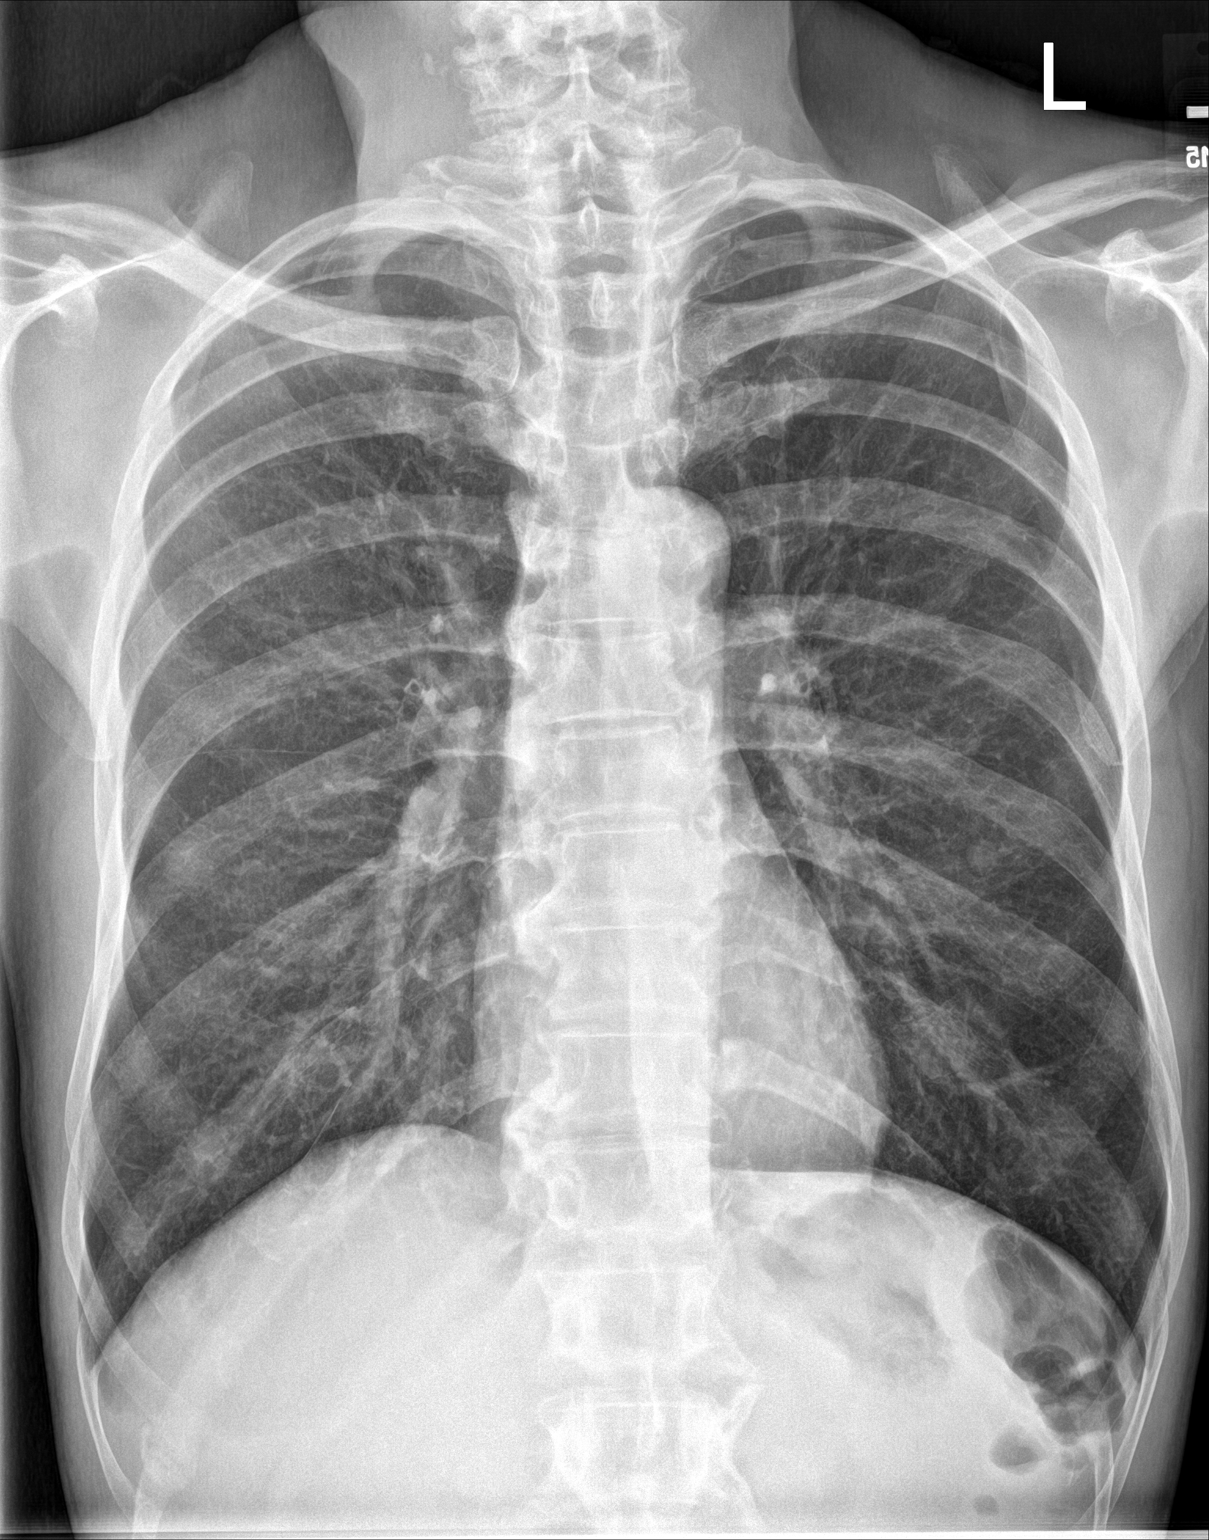

[chest lat]
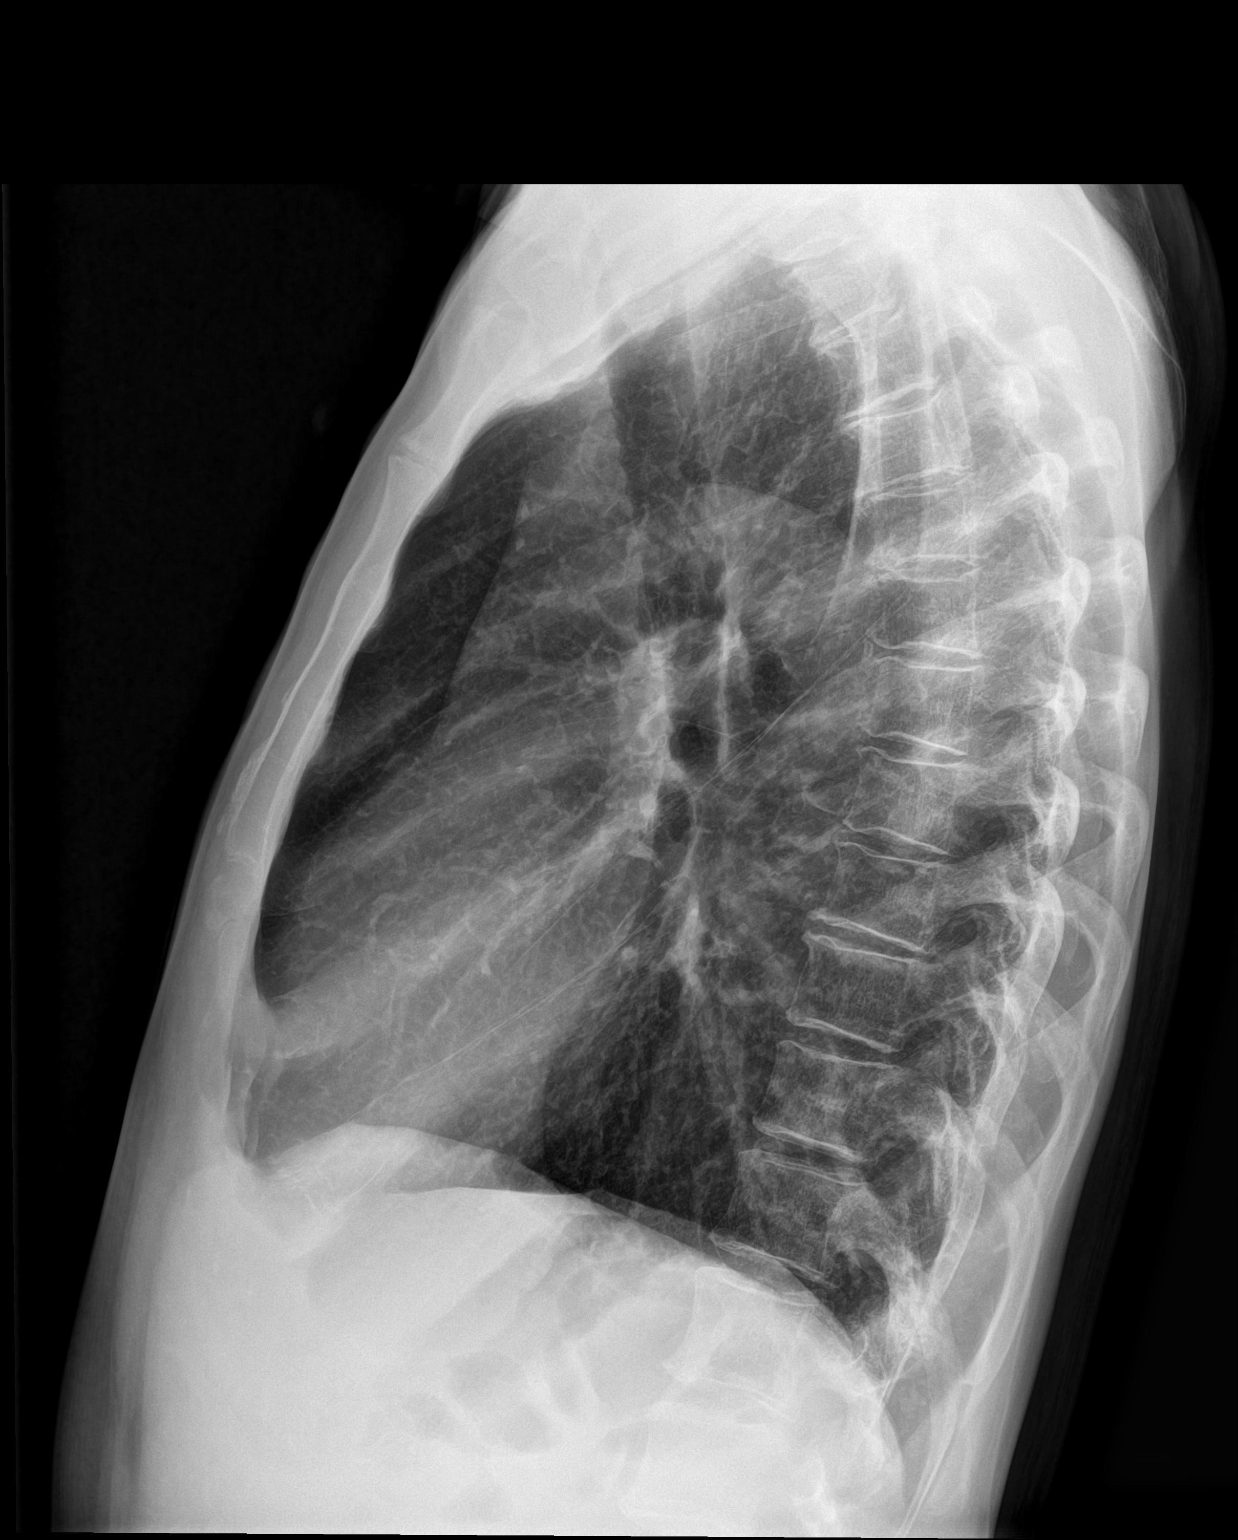

[2 of 2 positions shown; findings below may reference images not displayed]

FINDINGS: There is a suspected nipple shadow on the left. Lungs otherwise
clear. Heart size and pulmonary vascularity are normal. No
adenopathy. No bone lesions. Calcification noted in right carotid
artery.
IMPRESSION: Suspect nipple shadow on the left; advise repeat study with nipple
markers to confirm. No edema or airspace opacity. Heart size normal.

Focus of calcification noted in right carotid artery.

## 2020-08-06 ENCOUNTER — Ambulatory Visit (HOSPITAL_COMMUNITY)
Admission: RE | Admit: 2020-08-06 | Discharge: 2020-08-06 | Disposition: A | Discharge status: Home / Self Care | Payer: BC Managed Care – PPO | Source: Ambulatory Visit | Attending: Internal Medicine | Admitting: Internal Medicine

## 2020-08-06 ENCOUNTER — Other Ambulatory Visit: Payer: Self-pay

## 2020-08-06 DIAGNOSIS — J432 Centrilobular emphysema: Secondary | ICD-10-CM | POA: Diagnosis not present

## 2020-08-06 DIAGNOSIS — I2510 Atherosclerotic heart disease of native coronary artery without angina pectoris: Secondary | ICD-10-CM | POA: Diagnosis not present

## 2020-08-06 DIAGNOSIS — D1432 Benign neoplasm of left bronchus and lung: Secondary | ICD-10-CM | POA: Diagnosis not present

## 2020-08-06 DIAGNOSIS — I700 Atherosclerosis of aorta: Secondary | ICD-10-CM | POA: Diagnosis not present

## 2020-08-06 DIAGNOSIS — R918 Other nonspecific abnormal finding of lung field: Secondary | ICD-10-CM | POA: Diagnosis not present

## 2020-08-06 IMAGING — CT CT CHEST W/O CM
2 of 4 series · 15 of 36 positions shown, 18 images · non-contrast
Comparison: Chest CT [DATE]

CLINICAL DATA: Follow-up pulmonary nodules.

EXAM:
CT CHEST WITHOUT CONTRAST
TECHNIQUE: Multidetector CT imaging of the chest was performed following the
standard protocol without IV contrast.

[Series 2: routine chest without · axial · non-contrast · 0.71mm/px · z∈[+1056,+1352]mm · 12 of 176 slices shown, 15 images]
[im 14/176  mediastinal]
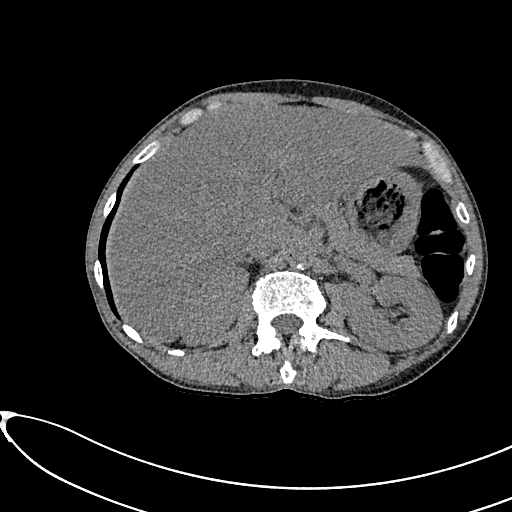
[im 14/176  lung]
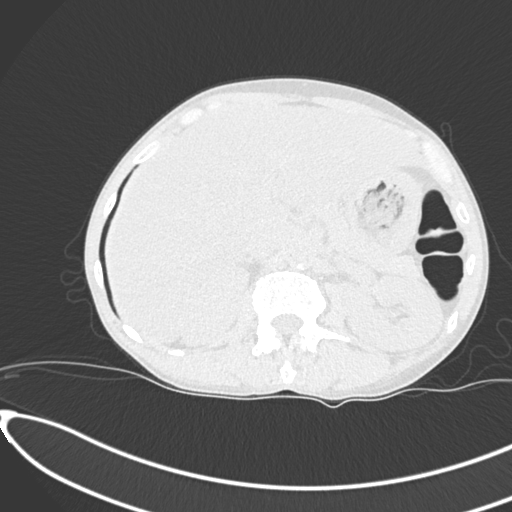
[im 27/176  lung]
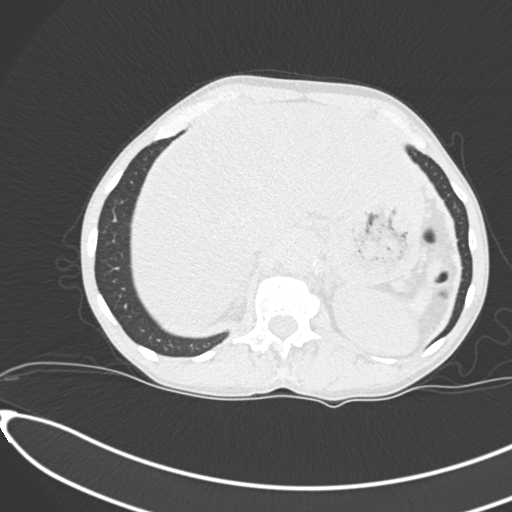
[im 41/176  lung]
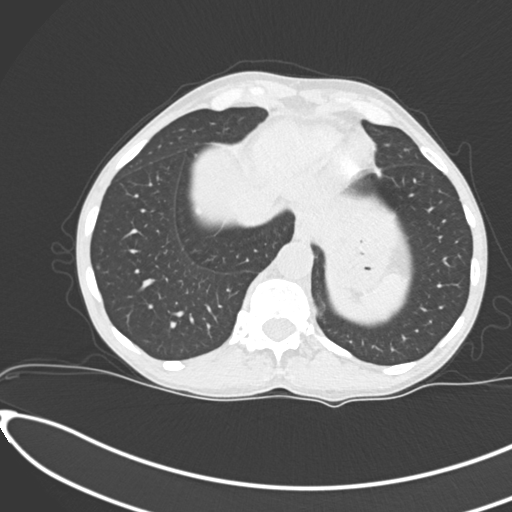
[im 54/176  lung]
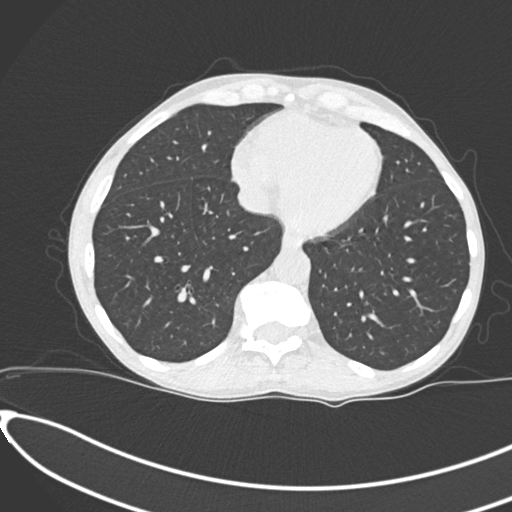
[im 68/176  mediastinal]
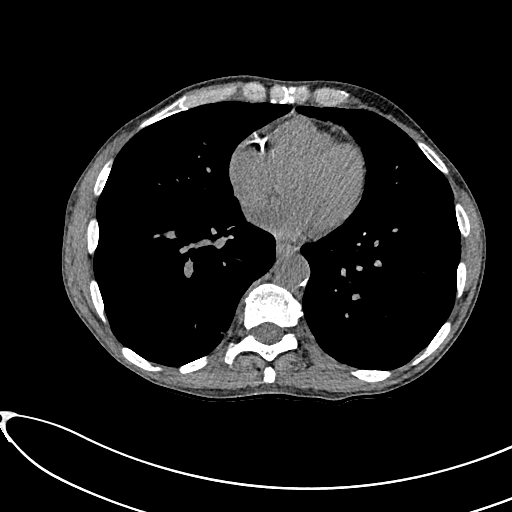
[im 68/176  lung]
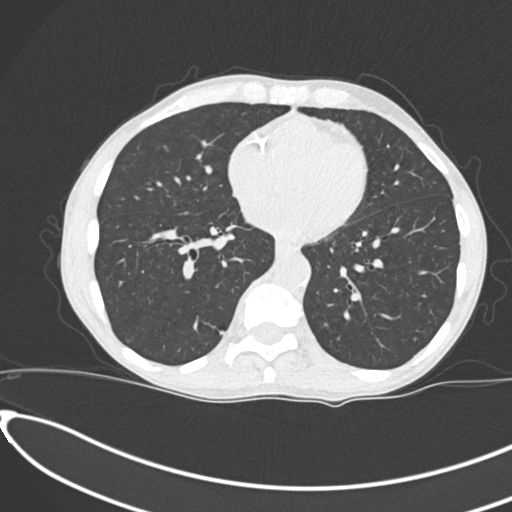
[im 81/176  lung]
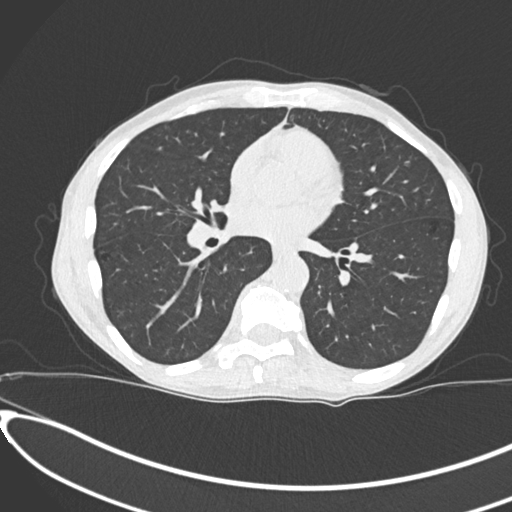
[im 95/176  lung]
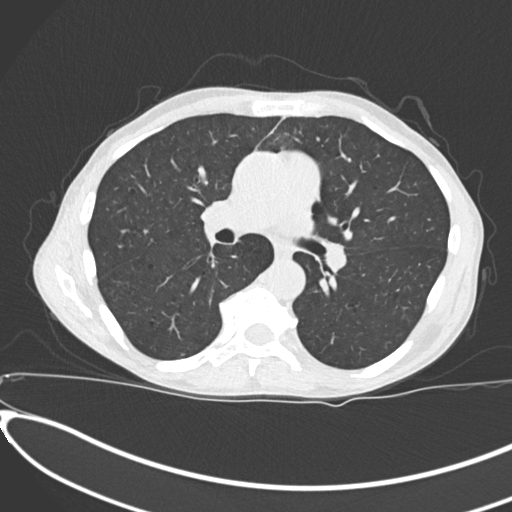
[im 108/176  lung]
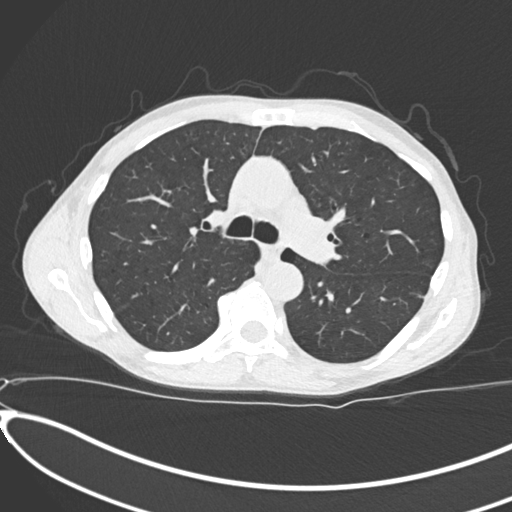
[im 122/176  mediastinal]
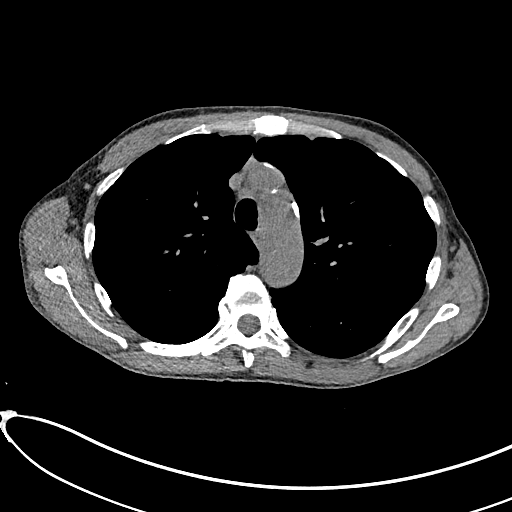
[im 122/176  lung]
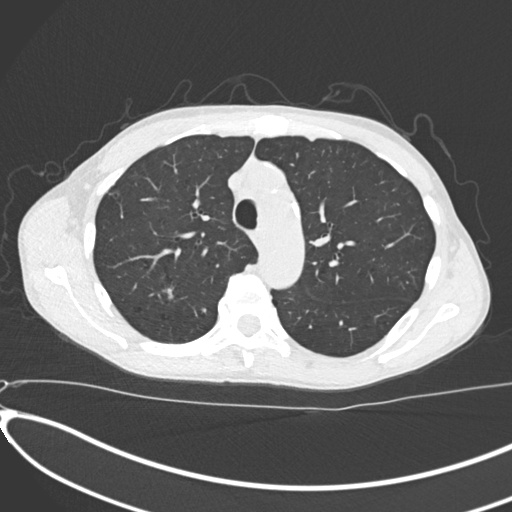
[im 135/176  lung]
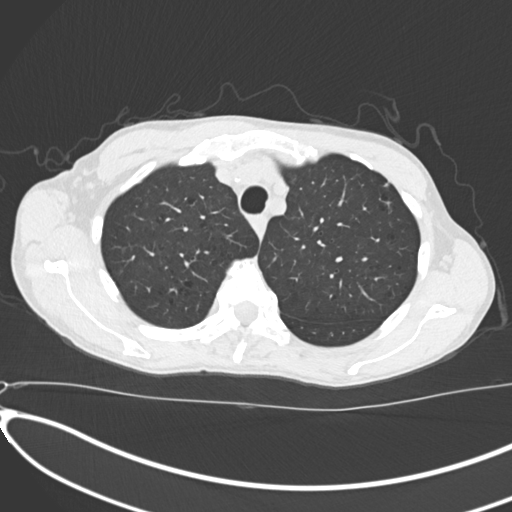
[im 149/176  lung]
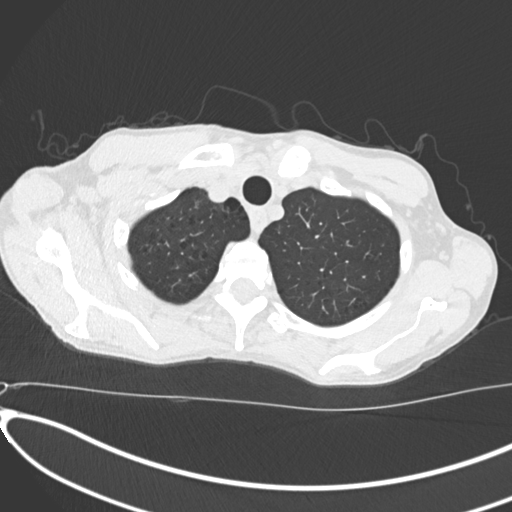
[im 162/176  lung]
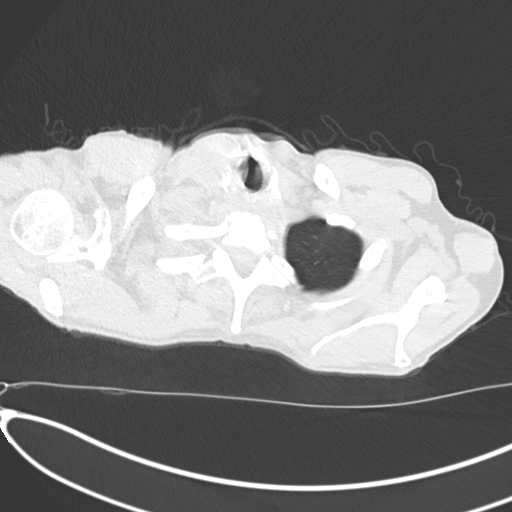

[Series 5: coronal · coronal · 0.67mm/px · 3 of 127 slices shown]
[im 26/127  lung]
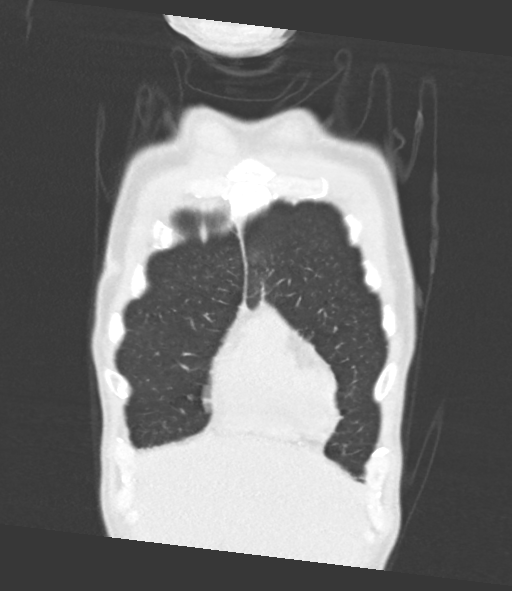
[im 51/127  lung]
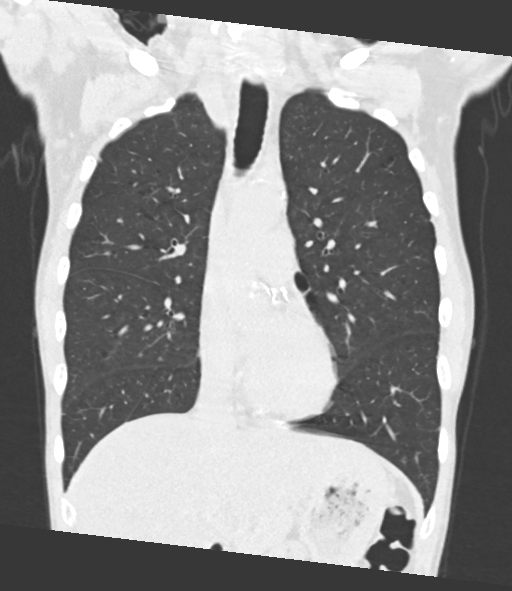
[im 76/127  lung]
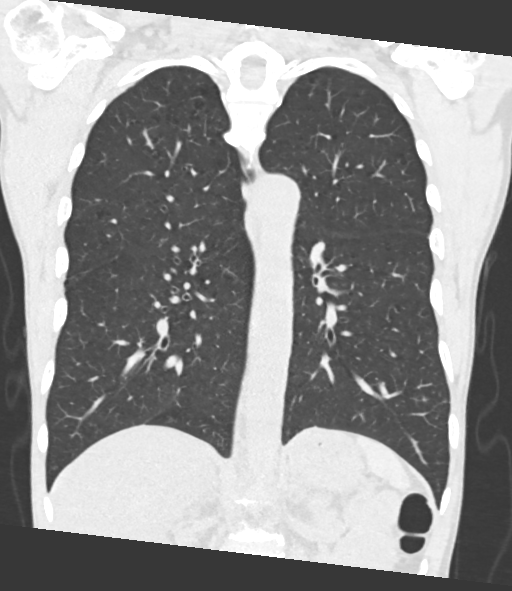

[15 of 36 positions shown; findings below may reference images not displayed]

FINDINGS: Cardiovascular: Atherosclerotic calcifications of the aorta and
branch vessels. No thoracic aortic aneurysm. A age coronary artery
calcifications. Normal size heart. No significant pericardial
effusion/thickening.

Mediastinum/Nodes: No enlarged mediastinal or axillary lymph nodes.
Thyroid gland, trachea, and esophagus demonstrate no significant
findings.

Lungs/Pleura: Mild centrilobular emphysema. No significant change in
the multiple small scattered small pulmonary nodules in the
bilateral lungs. No new or enlarging suspicious pulmonary nodule.
Reference nodules are as follows:

-5 mm right upper lobe subpleural pulmonary nodule on image 76/4,
unchanged.

-4 mm right upper lobe pulmonary nodule on image 54/4, unchanged

-3 mm left upper lobe pulmonary nodule on image 52/4, unchanged

-5 mm left lower lobe pulmonary nodule on image 128/4, unchanged

Upper Abdomen: No acute abnormality

Musculoskeletal: No acute abnormality.
IMPRESSION: 1. No significant change in the multiple small scattered small
pulmonary nodules in the bilateral lungs. No new or enlarging
suspicious pulmonary nodule.
2. Emphysema and aortic atherosclerosis.

Aortic Atherosclerosis ([IX]-[IX]) and Emphysema ([IX]-[IX]).

## 2020-08-11 ENCOUNTER — Other Ambulatory Visit: Payer: Self-pay

## 2020-08-11 ENCOUNTER — Emergency Department (HOSPITAL_COMMUNITY)
Admission: EM | Admit: 2020-08-11 | Discharge: 2020-08-11 | Disposition: A | Discharge status: Home / Self Care | Payer: BC Managed Care – PPO | Attending: Emergency Medicine | Admitting: Emergency Medicine

## 2020-08-11 ENCOUNTER — Emergency Department (HOSPITAL_COMMUNITY): Payer: BC Managed Care – PPO

## 2020-08-11 ENCOUNTER — Encounter (HOSPITAL_COMMUNITY): Payer: Self-pay | Admitting: Emergency Medicine

## 2020-08-11 DIAGNOSIS — R410 Disorientation, unspecified: Secondary | ICD-10-CM | POA: Diagnosis not present

## 2020-08-11 DIAGNOSIS — R569 Unspecified convulsions: Secondary | ICD-10-CM | POA: Diagnosis not present

## 2020-08-11 DIAGNOSIS — R404 Transient alteration of awareness: Secondary | ICD-10-CM | POA: Diagnosis not present

## 2020-08-11 DIAGNOSIS — F17210 Nicotine dependence, cigarettes, uncomplicated: Secondary | ICD-10-CM | POA: Insufficient documentation

## 2020-08-11 DIAGNOSIS — S01512A Laceration without foreign body of oral cavity, initial encounter: Secondary | ICD-10-CM | POA: Insufficient documentation

## 2020-08-11 DIAGNOSIS — Y990 Civilian activity done for income or pay: Secondary | ICD-10-CM | POA: Insufficient documentation

## 2020-08-11 DIAGNOSIS — S0993XA Unspecified injury of face, initial encounter: Secondary | ICD-10-CM | POA: Diagnosis not present

## 2020-08-11 DIAGNOSIS — W19XXXA Unspecified fall, initial encounter: Secondary | ICD-10-CM | POA: Insufficient documentation

## 2020-08-11 DIAGNOSIS — R000 Tachycardia, unspecified: Secondary | ICD-10-CM | POA: Diagnosis not present

## 2020-08-11 DIAGNOSIS — I10 Essential (primary) hypertension: Secondary | ICD-10-CM | POA: Diagnosis not present

## 2020-08-11 LAB — CBC WITH DIFFERENTIAL/PLATELET
Abs Immature Granulocytes: 0.03 10*3/uL (ref 0.00–0.07)
Basophils Absolute: 0.1 10*3/uL (ref 0.0–0.1)
Basophils Relative: 1 %
Eosinophils Absolute: 0.1 10*3/uL (ref 0.0–0.5)
Eosinophils Relative: 1 %
HCT: 38.4 % — ABNORMAL LOW (ref 39.0–52.0)
Hemoglobin: 12.5 g/dL — ABNORMAL LOW (ref 13.0–17.0)
Immature Granulocytes: 0 %
Lymphocytes Relative: 22 %
Lymphs Abs: 2 10*3/uL (ref 0.7–4.0)
MCH: 30.8 pg (ref 26.0–34.0)
MCHC: 32.6 g/dL (ref 30.0–36.0)
MCV: 94.6 fL (ref 80.0–100.0)
Monocytes Absolute: 0.9 10*3/uL (ref 0.1–1.0)
Monocytes Relative: 10 %
Neutro Abs: 6.1 10*3/uL (ref 1.7–7.7)
Neutrophils Relative %: 66 %
Platelets: 182 10*3/uL (ref 150–400)
RBC: 4.06 MIL/uL — ABNORMAL LOW (ref 4.22–5.81)
RDW: 17.1 % — ABNORMAL HIGH (ref 11.5–15.5)
WBC: 9.1 10*3/uL (ref 4.0–10.5)
nRBC: 0 % (ref 0.0–0.2)

## 2020-08-11 LAB — BASIC METABOLIC PANEL
Anion gap: 14 (ref 5–15)
BUN: 7 mg/dL (ref 6–20)
CO2: 25 mmol/L (ref 22–32)
Calcium: 9.2 mg/dL (ref 8.9–10.3)
Chloride: 95 mmol/L — ABNORMAL LOW (ref 98–111)
Creatinine, Ser: 0.66 mg/dL (ref 0.61–1.24)
GFR, Estimated: >60 mL/min (ref >60)
Glucose, Bld: 127 mg/dL — ABNORMAL HIGH (ref 70–99)
Potassium: 3.3 mmol/L — ABNORMAL LOW (ref 3.5–5.1)
Sodium: 134 mmol/L — ABNORMAL LOW (ref 135–145)

## 2020-08-11 LAB — ETHANOL: Alcohol, Ethyl (B): <10 mg/dL (ref <10)

## 2020-08-11 IMAGING — CT CT HEAD W/O CM
4 series · 16 of 47 positions shown, 18 images · non-contrast
Comparison: None.

CLINICAL DATA: Seizure

EXAM:
CT HEAD WITHOUT CONTRAST
TECHNIQUE: Contiguous axial images were obtained from the base of the skull
through the vertex without intravenous contrast.

[Series 2: head w o · axial · 0.45mm/px · z∈[+70,+185]mm · 7 of 31 slices shown, 9 images]
[im 4/31  brain]
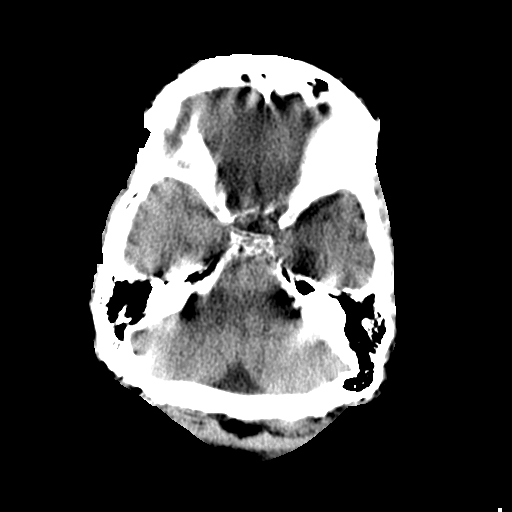
[im 4/31  bone]
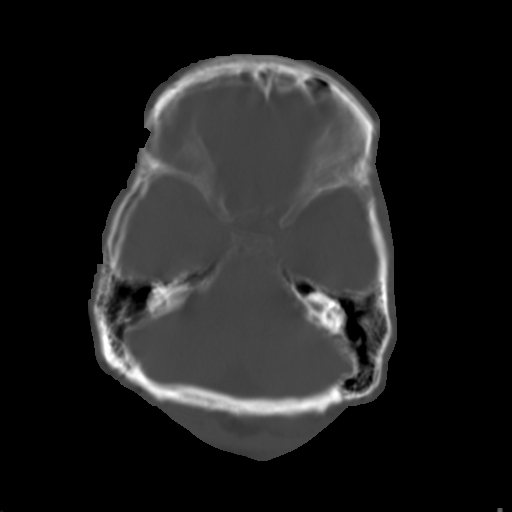
[im 8/31  brain]
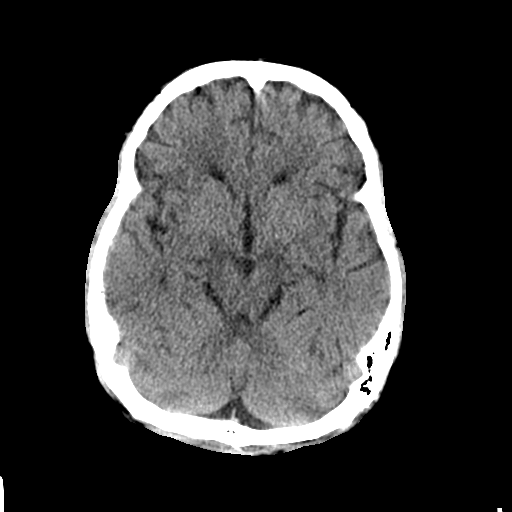
[im 12/31  brain]
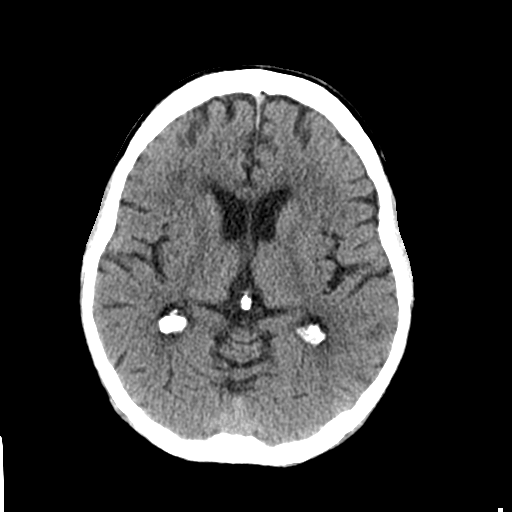
[im 16/31  brain]
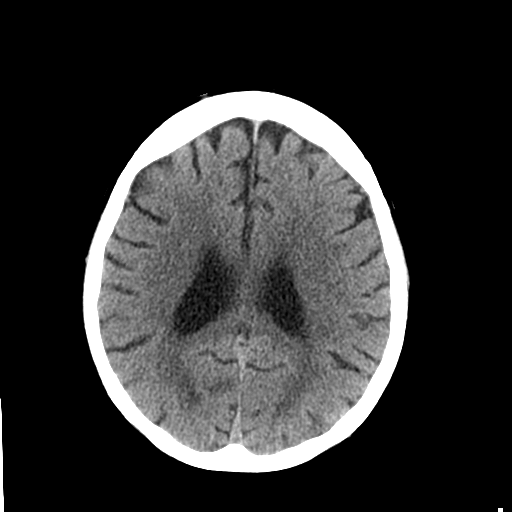
[im 19/31  brain]
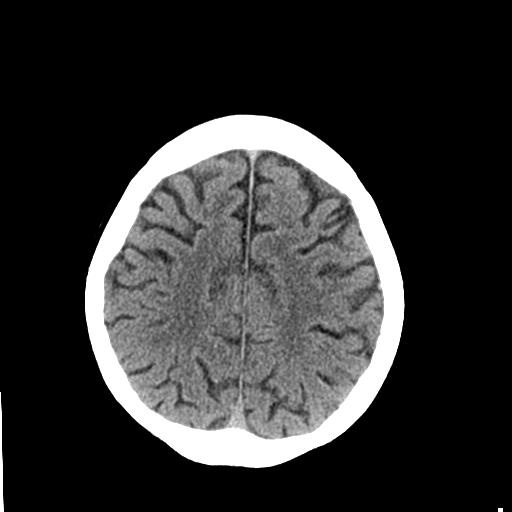
[im 19/31  bone]
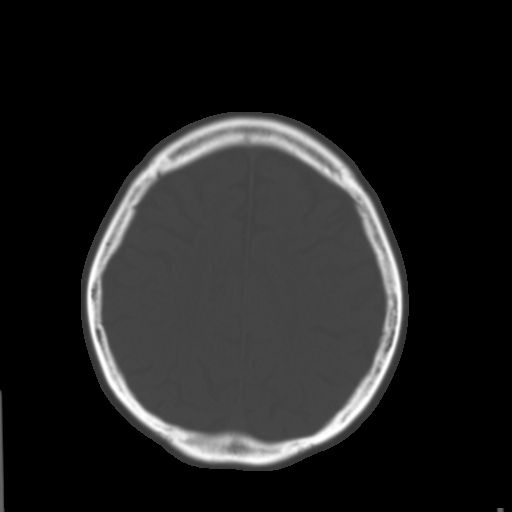
[im 23/31  brain]
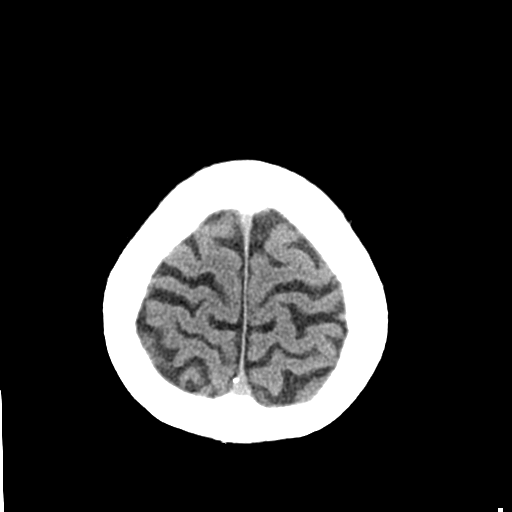
[im 27/31  brain]
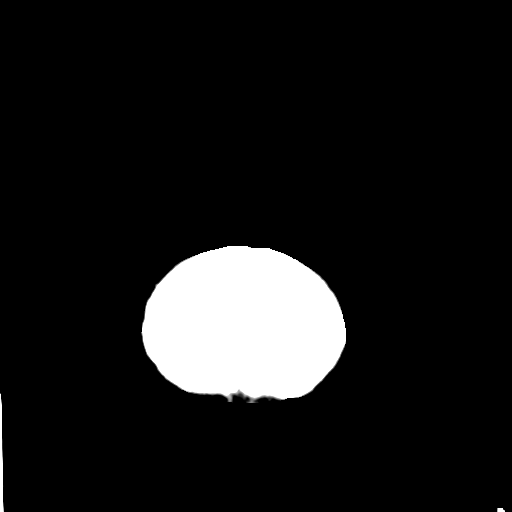

[Series 3: head bone · axial · 0.45mm/px · z∈[+69,+99]mm · 3 of 77 slices shown]
[im 8/77  bone]
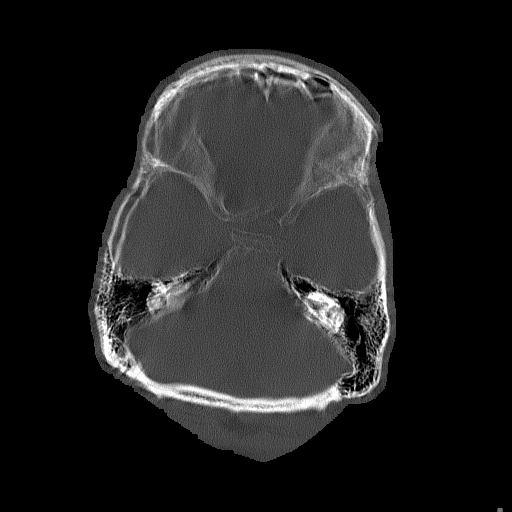
[im 16/77  bone]
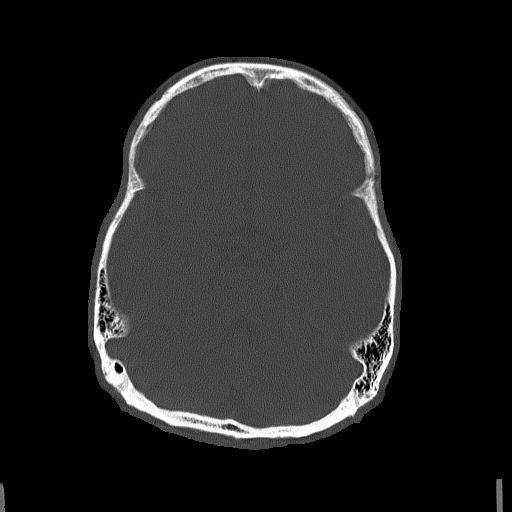
[im 23/77  bone]
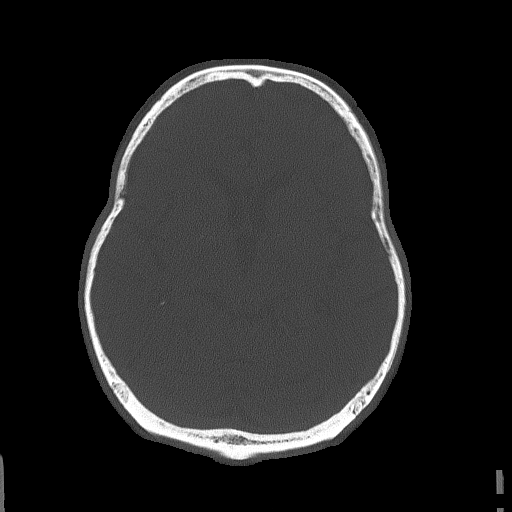

[Series 4: coronal soft · coronal · 0.30mm/px · 3 of 66 slices shown]
[im 22/66  brain]
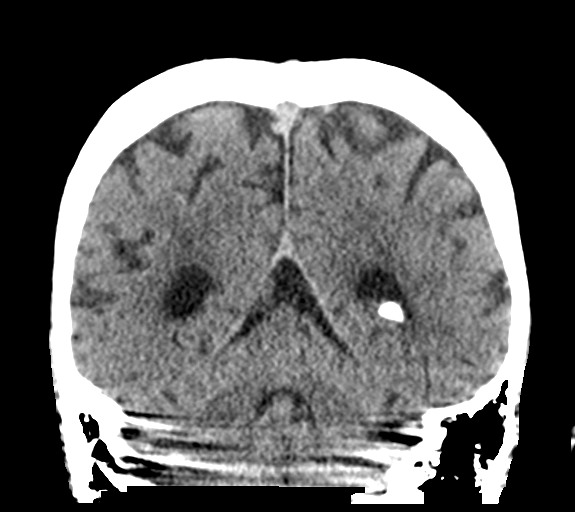
[im 29/66  brain]
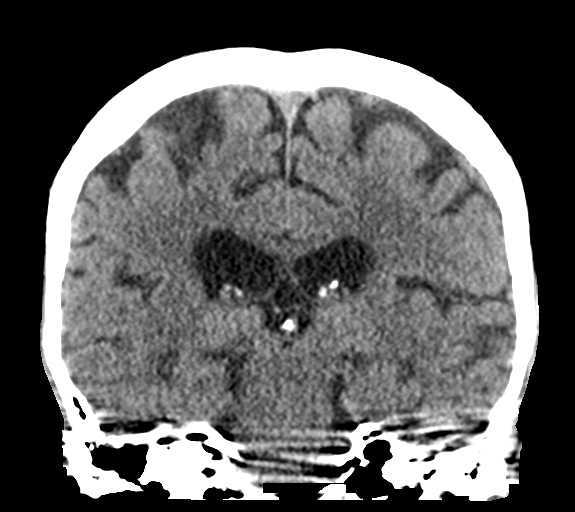
[im 37/66  brain]
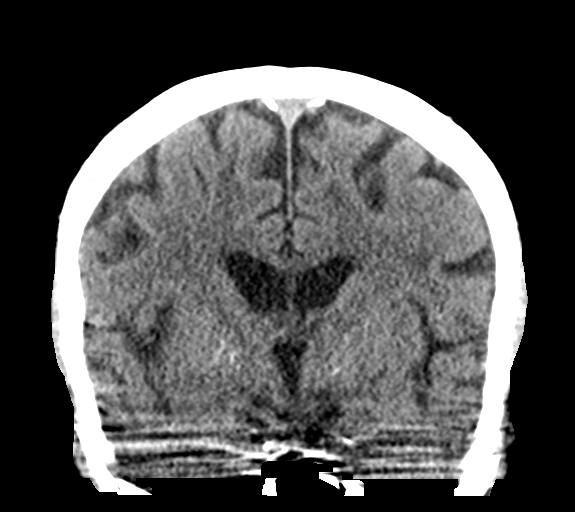

[Series 5: sagittal soft · sagittal · 0.30mm/px · 3 of 53 slices shown]
[im 18/53  brain]
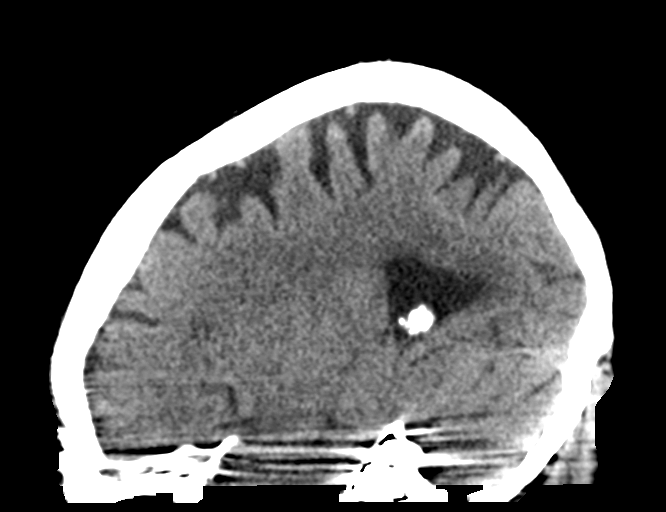
[im 27/53  brain]
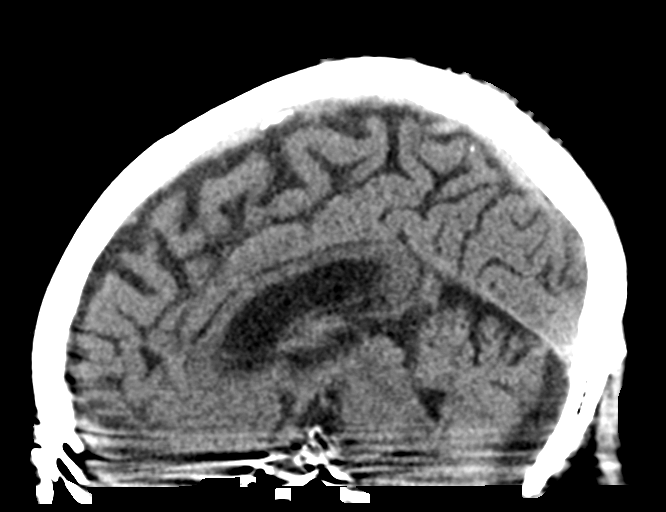
[im 35/53  brain]
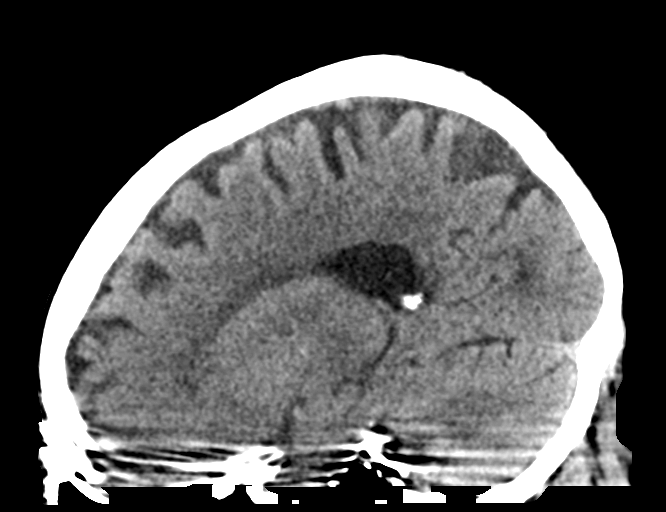

[16 of 47 positions shown; findings below may reference images not displayed]

FINDINGS: Images are slightly limited, particularly the skull base, by motion
artifact.

Brain: Mild parenchymal volume loss, slightly advanced given the
patient's age. Mild periventricular white matter changes are present
likely reflecting the sequela of small vessel ischemia. No abnormal
mass effect or midline shift. No evidence of acute intracranial
hemorrhage or infarct. No abnormal intra or extra-axial mass lesion
or fluid collection. Cerebellum is unremarkable. Ventricular size is
normal.

Vascular: No asymmetric hyperdense vasculature at the skull base.

Skull: No acute bone abnormality.

Sinuses/Orbits: The visualized orbits are unremarkable. The
visualized paranasal sinuses are clear.

Other: The visualized mastoid air cells and middle ear cavities are
clear.
IMPRESSION: Slightly limited examination without evidence of acute intracranial
hemorrhage or infarct.

Senescent changes, slightly advanced in relation to the patient's
given age.

## 2020-08-11 MED ORDER — SODIUM CHLORIDE 0.9 % IV SOLN: Freq: Once | INTRAVENOUS | Status: AC

## 2020-08-11 MED ORDER — LORAZEPAM 1 MG PO TABS: ORAL_TABLET | ORAL | 0 refills | Status: DC

## 2020-08-11 MED ORDER — LORAZEPAM 2 MG/ML IJ SOLN
2.0000 mg | Freq: Once | INTRAMUSCULAR | Status: AC
  Administered 2020-08-11: 2 mg via INTRAVENOUS
  Filled 2020-08-11: qty 1

## 2020-08-11 NOTE — ED Notes (Signed)
Had to change bed sheet pt was wet

## 2020-08-11 NOTE — Discharge Instructions (Addendum)
Please be aware you may have another seizure.  Do not drink any form of alcohol including liquor and beer.  Do not drive until seen by your physician for your condition  Do not climb ladders/roofs/trees as a seizure can occur at that height and cause serious harm  Do not bathe/swim alone as a seizure can occur and cause serious harm  Please followup with your physician or neurologist for further testing and possible treatment

## 2020-08-11 NOTE — ED Provider Notes (Signed)
7:15 AM-patient presented for evaluation of seizure felt to be possibly related to alcohol withdrawal, will work this morning.  This is a single seizure, first-time.  Currently, patient is being observed.  Work-up does not reveal acute abnormalities.  Dr. Christy Gentles has sent a prescription of benzodiazepine to his pharmacy, and written AVS instructions for discharge with neurology follow-up and precautions for seizure.  11:38 AM-reviewed labs and vital signs.  Minor metabolic abnormalities on be met, hemoglobin slightly low.  I suspect that these are nutritionally related secondary to alcohol abuse.  No indication for hospitalization or intervention at this time.  He has not had any seizures during the ED stay.  Currently he is alert and comfortable and states he will call his wife to come pick him up. He has been given discharge instructions with instructions to not drive or drink alcohol.  He has been referred to neurology.  Work note has been given, until 08/16/2020.   Daleen Bo, MD 08/11/20 [phone]

## 2020-08-11 NOTE — ED Provider Notes (Signed)
[facility] EMERGENCY DEPARTMENT Provider Note   CSN: [id number] Arrival date & time: 08/11/20  0531     History Chief Complaint  Patient presents with  . Seizures    Nicholas Avila is a 60 y.o. male.  The history is provided by the patient.  Seizures Seizure activity on arrival: no   Postictal symptoms: confusion   Return to baseline: yes   Severity:  Moderate Timing:  Once Progression:  Improving  Patient with previous history of atrial fibrillation, alcohol use disorder presents with seizure.  Patient was at work when his coworkers noted that he was on the floor having a seizure.  It was described as a tonic-clonic seizure.  The seizure resolved and patient is now back to baseline.  Patient denies any known history of seizures.  Patient does admit to alcohol use, last use was yesterday.  Denies any headache or fevers.  No chest pain or abdominal pain.  He did bite his tongue during the seizure   His course is improving. Past Medical History:  Diagnosis Date  . Alcoholism in remission ([place])    quit 12/10/17. longest time alcohol free. reports multiple previous DUIs (04/08/18)  . Atrial flutter ([place])    a. diagnosed in 11/2017 during admission for Sepsis b. s/p successful DCCV in 01/2018.  Marland Kitchen Dysrhythmia    AFib  . Pulmonary nodules   . Tobacco use     Patient Active Problem List   Diagnosis Date Noted  . Encounter for screening colonoscopy 04/08/2018  . Alcoholism in remission ([place]) 04/08/2018  . Alcohol withdrawal syndrome with complication, with unspecified complication ([place]) [id number]  . Delirium tremens ([place]) 12/13/2017  . Tachycardia 12/10/2017  . Syncopal episodes 12/10/2017  . Abnormal EKG 12/10/2017  . Hyperglycemia 12/10/2017  . Sepsis ([place]) 12/10/2017  . Calcium blood decreased 12/10/2017  . Atrial fibrillation, transient ([place]) 12/10/2017  . Smoking 12/10/2017    Past Surgical History:  Procedure Laterality Date  . CARDIOVERSION N/A 01/24/2018    Procedure: CARDIOVERSION;  Surgeon: Arnoldo Lenis, MD;  Location: [facility] ENDO SUITE;  Service: Endoscopy;  Laterality: N/A;  . COLONOSCOPY WITH PROPOFOL N/A 05/23/2018   Procedure: COLONOSCOPY WITH PROPOFOL;  Surgeon: Daneil Dolin, MD;  Location: [facility] ENDO SUITE;  Service: Endoscopy;  Laterality: N/A;  8:30am  . POLYPECTOMY  05/23/2018   Procedure: POLYPECTOMY;  Surgeon: Daneil Dolin, MD;  Location: [facility] ENDO SUITE;  Service: Endoscopy;;  cecal polyp, descending polyp       Family History  Problem Relation Age of Onset  . Prostate cancer Father        late 42s  . Heart disease Maternal Aunt   . Colon cancer Neg Hx     Social History   Tobacco Use  . Smoking status: Current Every Day Smoker    Packs/day: 0.75    Years: 35.00    Pack years: 26.25    Types: Cigarettes  . Smokeless tobacco: Never Used  Vaping Use  . Vaping Use: Never used  Substance Use Topics  . Alcohol use: Yes    Comment: couple beers a day  . Drug use: Yes    Types: Marijuana    Comment: last used 01/22/2018    Home Medications Prior to Admission medications   Not on File    Allergies    Patient has no known allergies.  Review of Systems   Review of Systems  Constitutional: Negative for fever.  HENT:  Tongue laceration  Cardiovascular: Negative for chest pain.  Gastrointestinal: Negative for abdominal pain.  Neurological: Positive for seizures. Negative for headaches.  All other systems reviewed and are negative.   Physical Exam Updated Vital Signs BP (!) 188/106   Pulse (!) 106   Temp 98 F (36.7 C)   Resp 20   Ht 1.803 m (5\' 11" )   Wt 61.2 kg   SpO2 96%   BMI 18.83 kg/m   Physical Exam CONSTITUTIONAL: Thin appearing, no acute distress HEAD: Normocephalic/atraumatic, no signs of trauma EYES: EOMI/PERRL, no nystagmus ENMT: Mucous membranes moist, small laceration noted to tongue, no active bleed NECK: supple no meningeal signs SPINE/BACK:entire spine nontender, no  bruising/crepitance/stepoffs noted to spine CV: S1/S2 noted, no murmurs/rubs/gallops noted LUNGS: Lungs are clear to auscultation bilaterally, no apparent distress ABDOMEN: soft, nontender, no rebound or guarding, bowel sounds noted throughout abdomen GU:no cva tenderness NEURO: Pt is awake/alert/appropriate, moves all extremitiesx4.  No facial droop.  No focal weakness noted.  GCS 15.  Mild tremor noted to bilateral hands EXTREMITIES: pulses normal/equal, full ROM, no deformities or tenderness SKIN: warm, color normal PSYCH: no abnormalities of mood noted, alert and oriented to situation  ED Results / Procedures / Treatments   Labs (all labs ordered are listed, but only abnormal results are displayed) Labs Reviewed  BASIC METABOLIC PANEL - Abnormal; Notable for the following components:      Result Value   Sodium 134 (*)    Potassium 3.3 (*)    Chloride 95 (*)    Glucose, Bld 127 (*)    All other components within normal limits  CBC WITH DIFFERENTIAL/PLATELET - Abnormal; Notable for the following components:   RBC 4.06 (*)    Hemoglobin 12.5 (*)    HCT 38.4 (*)    RDW 17.1 (*)    All other components within normal limits  ETHANOL    EKG EKG Interpretation  Date/Time:  Wednesday August 11 2020 05:38:11 EDT Ventricular Rate:  105 PR Interval:  145 QRS Duration: 98 QT Interval:  352 QTC Calculation: 466 R Axis:   97 Text Interpretation: Sinus tachycardia Right atrial enlargement Borderline right axis deviation Left ventricular hypertrophy Confirmed by Ripley Fraise [phone]) on 08/11/2020 5:51:16 AM   Radiology CT Head Wo Contrast  Result Date: 08/11/2020 CLINICAL DATA:  Seizure EXAM: CT HEAD WITHOUT CONTRAST TECHNIQUE: Contiguous axial images were obtained from the base of the skull through the vertex without intravenous contrast. COMPARISON:  None. FINDINGS: Images are slightly limited, particularly the skull base, by motion artifact. Brain: Mild parenchymal volume loss,  slightly advanced given the patient's age. Mild periventricular white matter changes are present likely reflecting the sequela of small vessel ischemia. No abnormal mass effect or midline shift. No evidence of acute intracranial hemorrhage or infarct. No abnormal intra or extra-axial mass lesion or fluid collection. Cerebellum is unremarkable. Ventricular size is normal. Vascular: No asymmetric hyperdense vasculature at the skull base. Skull: No acute bone abnormality. Sinuses/Orbits: The visualized orbits are unremarkable. The visualized paranasal sinuses are clear. Other: The visualized mastoid air cells and middle ear cavities are clear. IMPRESSION: Slightly limited examination without evidence of acute intracranial hemorrhage or infarct. Senescent changes, slightly advanced in relation to the patient's given age. Electronically Signed   By: Fidela Salisbury MD   On: 08/11/2020 06:39    Procedures Procedures   Medications Ordered in ED Medications  LORazepam (ATIVAN) injection 2 mg (2 mg Intravenous Given 08/11/20 0553)  0.9 %  sodium  chloride infusion ( Intravenous New Bag/Given 08/11/20 0554)    ED Course  I have reviewed the triage vital signs and the nursing notes.  Pertinent labs & imaging results that were available during my care of the patient were reviewed by me and considered in my medical decision making (see chart for details).    MDM Rules/Calculators/A&P                          6:11 AM Patient presents with new onset seizure.  He is currently awake and alert at this time.  Patient does have a known history of alcohol abuse.  He has suffered alcohol withdrawal in the past while being admitted to the hospital.  I suspect this may be an alcohol withdrawal seizure.  Ativan has been ordered.  Due to new onset seizure, will obtain CT head 7:00 AM CT head negative.  Patient is now resting comfortably.  Wife is now bedside.  She reports the patient drinks beer every day.  However he has  been cutting back so he can go back to work. Continue suspicion for alcohol withdrawal.  But overall patient appears to be improving.  He will be given a referral to neurology.  We will give a short taper of Ativan.  Discussed with wife the patient cannot drive and we discussed other limitations until he is seen by neurology 7:10 AM Signed out to Dr Eulis Foster at shift change to monitor  and ensure pt is at baseline Final Clinical Impression(s) / ED Diagnoses Final diagnoses:  Seizure [facility])    Rx / DC Orders ED Discharge Orders         Ordered    LORazepam (ATIVAN) 1 MG tablet        08/11/20 7588           Ripley Fraise, MD 08/11/20 0710

## 2020-08-11 NOTE — ED Triage Notes (Signed)
Pt was on break at work and had seizure like activity witnessed by coworkers.

## 2020-08-16 ENCOUNTER — Encounter: Payer: Self-pay | Admitting: Internal Medicine

## 2020-08-16 DIAGNOSIS — F1010 Alcohol abuse, uncomplicated: Secondary | ICD-10-CM | POA: Diagnosis not present

## 2020-08-16 DIAGNOSIS — R569 Unspecified convulsions: Secondary | ICD-10-CM | POA: Diagnosis not present

## 2020-08-16 DIAGNOSIS — R911 Solitary pulmonary nodule: Secondary | ICD-10-CM | POA: Diagnosis not present

## 2020-08-16 DIAGNOSIS — R053 Chronic cough: Secondary | ICD-10-CM | POA: Diagnosis not present

## 2020-08-23 ENCOUNTER — Encounter: Payer: Self-pay | Admitting: Internal Medicine

## 2020-09-22 ENCOUNTER — Ambulatory Visit: Payer: BC Managed Care – PPO | Admitting: Nurse Practitioner

## 2020-10-13 ENCOUNTER — Ambulatory Visit: Payer: BC Managed Care – PPO | Admitting: Gastroenterology

## 2020-12-03 DIAGNOSIS — I10 Essential (primary) hypertension: Secondary | ICD-10-CM | POA: Diagnosis not present

## 2020-12-03 DIAGNOSIS — J439 Emphysema, unspecified: Secondary | ICD-10-CM | POA: Diagnosis not present

## 2020-12-03 DIAGNOSIS — F17200 Nicotine dependence, unspecified, uncomplicated: Secondary | ICD-10-CM | POA: Diagnosis not present

## 2020-12-03 DIAGNOSIS — E785 Hyperlipidemia, unspecified: Secondary | ICD-10-CM | POA: Diagnosis not present

## 2020-12-10 DIAGNOSIS — I10 Essential (primary) hypertension: Secondary | ICD-10-CM | POA: Diagnosis not present

## 2020-12-10 DIAGNOSIS — J449 Chronic obstructive pulmonary disease, unspecified: Secondary | ICD-10-CM | POA: Diagnosis not present

## 2021-03-04 DIAGNOSIS — I10 Essential (primary) hypertension: Secondary | ICD-10-CM | POA: Diagnosis not present

## 2021-03-04 DIAGNOSIS — F1020 Alcohol dependence, uncomplicated: Secondary | ICD-10-CM | POA: Diagnosis not present

## 2021-03-04 DIAGNOSIS — R945 Abnormal results of liver function studies: Secondary | ICD-10-CM | POA: Diagnosis not present

## 2021-03-04 DIAGNOSIS — J449 Chronic obstructive pulmonary disease, unspecified: Secondary | ICD-10-CM | POA: Diagnosis not present

## 2021-03-10 DIAGNOSIS — I10 Essential (primary) hypertension: Secondary | ICD-10-CM | POA: Diagnosis not present

## 2021-03-24 ENCOUNTER — Ambulatory Visit (HOSPITAL_COMMUNITY)
Admission: RE | Admit: 2021-03-24 | Discharge: 2021-03-24 | Disposition: A | Discharge status: Home / Self Care | Payer: BC Managed Care – PPO | Source: Ambulatory Visit | Attending: Gerontology | Admitting: Gerontology

## 2021-03-24 ENCOUNTER — Other Ambulatory Visit: Payer: Self-pay

## 2021-03-24 ENCOUNTER — Other Ambulatory Visit (HOSPITAL_COMMUNITY): Payer: Self-pay | Admitting: Gerontology

## 2021-03-24 DIAGNOSIS — M25552 Pain in left hip: Secondary | ICD-10-CM | POA: Insufficient documentation

## 2021-03-24 IMAGING — DX DG HIP (WITH OR WITHOUT PELVIS) 2-3V*L*
3 series · 3 of 3 positions shown · non-contrast
Comparison: None.

CLINICAL DATA: Left hip pain radiating down left leg

EXAM:
DG HIP (WITH OR WITHOUT PELVIS) 2-3V LEFT

[pelvis ap]
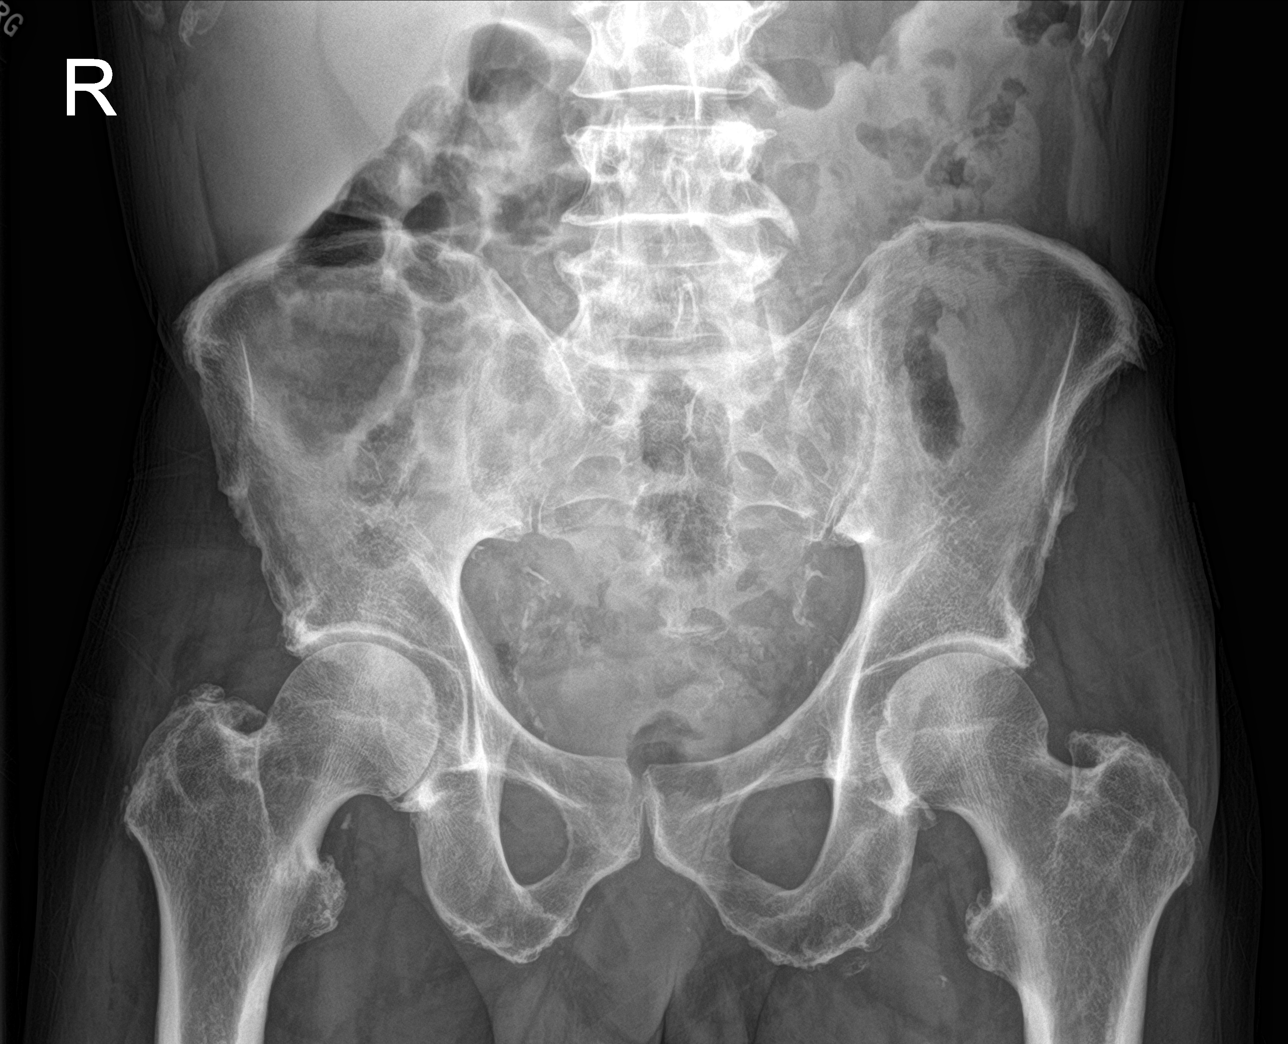

[hip ap]
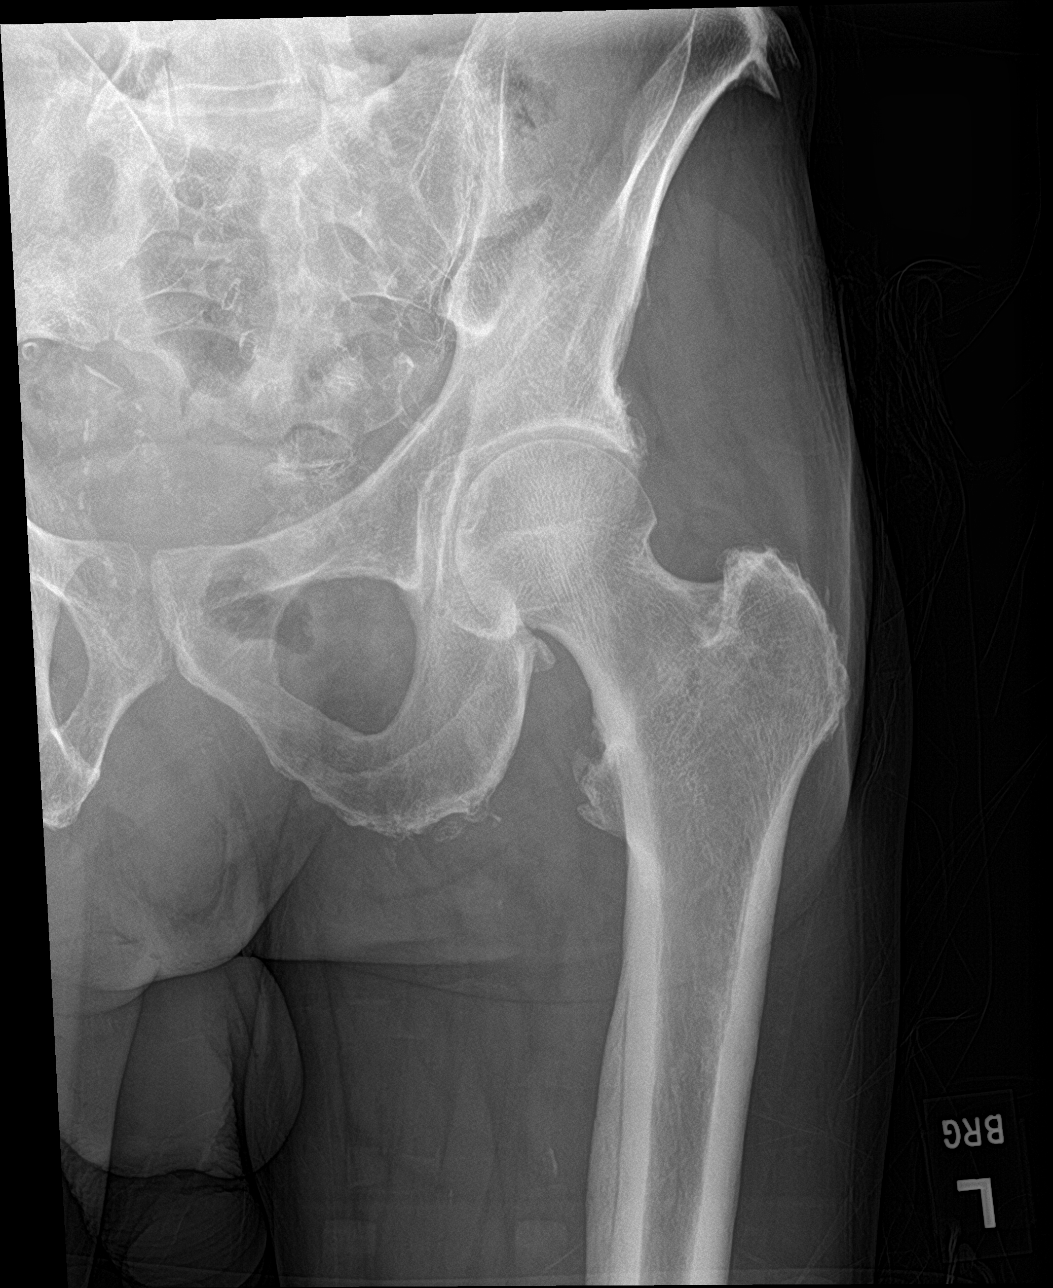

[hip lat]
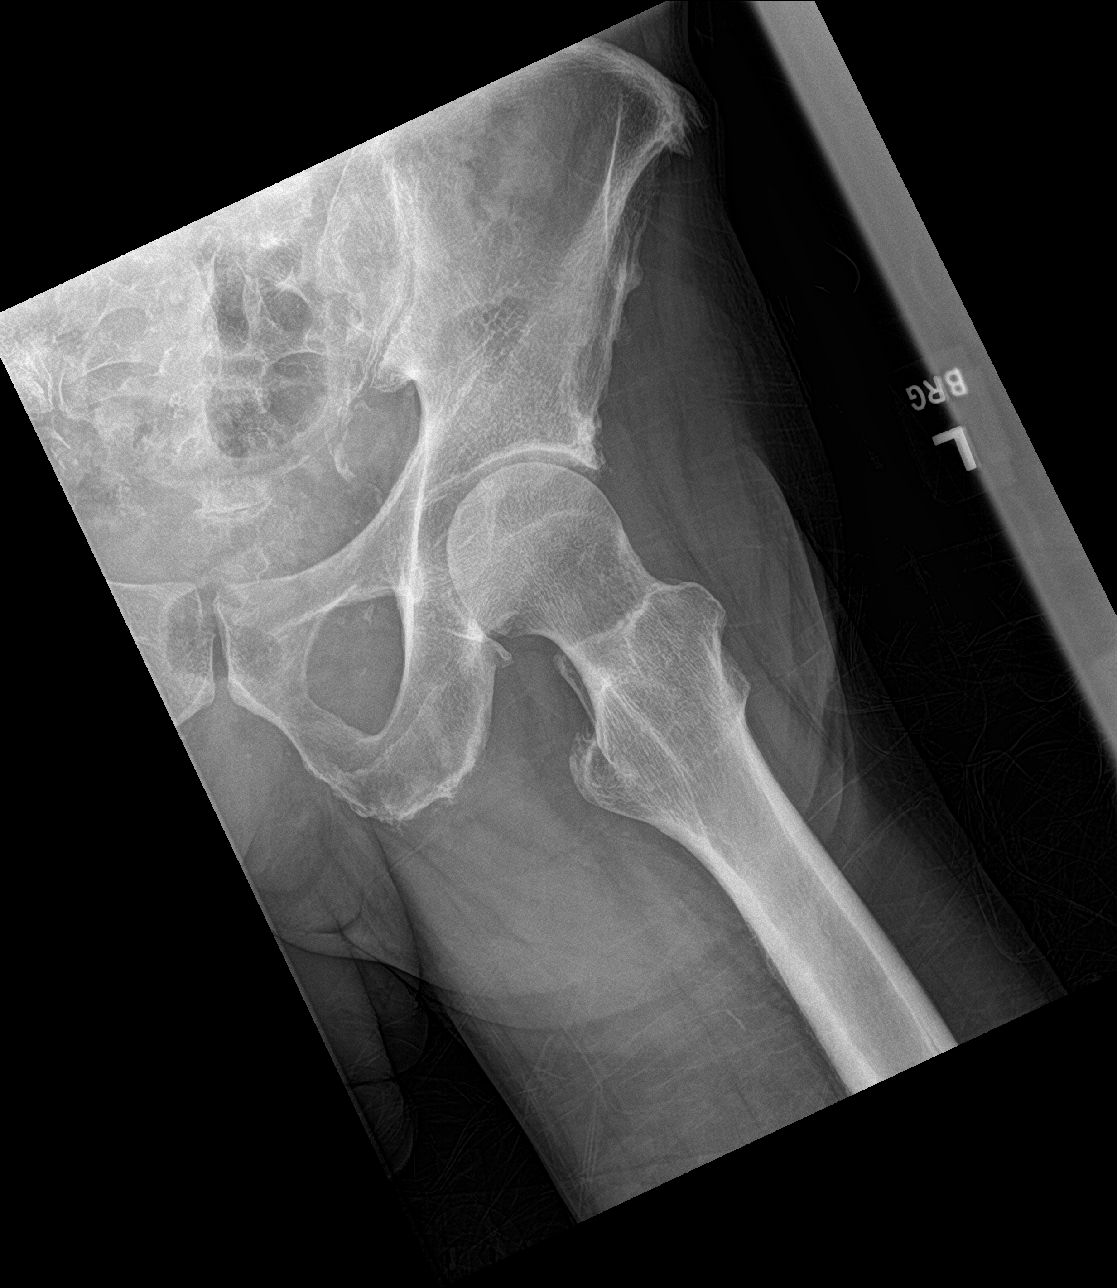

[3 of 3 positions shown; findings below may reference images not displayed]

FINDINGS: Frontal view of the pelvis as well as frontal and frogleg lateral
views of the left hip are obtained. No fracture, subluxation, or
dislocation within either hip. There is asymmetric cortical
thickening involving the medial aspect of the proximal left femoral
diaphysis, incompletely evaluated on this study. This could be
sequela of prior surgery or healed fracture. Dedicated left femur
series may be useful given clinical symptoms.

There is symmetrical bilateral hip osteoarthritis. Sacroiliac joints
are normal. There is mild lower lumbar spondylosis. Soft tissues are
unremarkable.
IMPRESSION: 1. Symmetrical bilateral hip osteoarthritis.
2. Mild lower lumbar spondylosis.
3. Cortical thickening along the visualized portion of the proximal
left femoral diaphysis, which may reflect sequela of previous healed
injury or buttressing. Further evaluation with dedicated left femur
x-rays may be useful for complete characterization.

## 2021-03-31 ENCOUNTER — Encounter: Payer: Self-pay | Admitting: Orthopaedic Surgery

## 2021-03-31 ENCOUNTER — Ambulatory Visit: Payer: BC Managed Care – PPO

## 2021-03-31 ENCOUNTER — Ambulatory Visit (INDEPENDENT_AMBULATORY_CARE_PROVIDER_SITE_OTHER): Payer: BC Managed Care – PPO | Admitting: Orthopaedic Surgery

## 2021-03-31 ENCOUNTER — Other Ambulatory Visit: Payer: Self-pay

## 2021-03-31 VITALS — BP 128/77 | HR 81 | Ht 71.0 in | Wt 129.0 lb

## 2021-03-31 DIAGNOSIS — M25562 Pain in left knee: Secondary | ICD-10-CM

## 2021-03-31 DIAGNOSIS — M25552 Pain in left hip: Secondary | ICD-10-CM

## 2021-03-31 DIAGNOSIS — G8929 Other chronic pain: Secondary | ICD-10-CM | POA: Diagnosis not present

## 2021-03-31 DIAGNOSIS — F17210 Nicotine dependence, cigarettes, uncomplicated: Secondary | ICD-10-CM

## 2021-03-31 MED ORDER — PREDNISONE 5 MG (21) PO TBPK: ORAL_TABLET | ORAL | 0 refills | Status: DC

## 2021-03-31 MED ORDER — HYDROCODONE-ACETAMINOPHEN 5-325 MG PO TABS: ORAL_TABLET | ORAL | 0 refills | Status: DC

## 2021-03-31 NOTE — Progress Notes (Signed)
Subjective:    Patient ID: Nicholas Avila, male    DOB: [birth year], 60 y.o.   MRN: [id number]  HPI He has had left hip pain, left knee pain and some back pain for several weeks.  He saw Dr. Legrand Rams and had X-rays of the hips done 03-24-21.  I have reviewed the notes from Dr. Legrand Rams.  Patient has pain after working his shift in the left hip and upper thigh area.  He denies any trauma.  He has tried Tylenol with no help. He has used heat.  He has no numbness or weakness.  He has an ace sleeve on the left knee.  He has no giving way of the knee.  I have independently reviewed and interpreted x-rays of this patient done at another site by another physician or qualified health professional.    Review of Systems  Constitutional:  Positive for activity change.  Musculoskeletal:  Positive for arthralgias, back pain, gait problem, joint swelling and myalgias.  All other systems reviewed and are negative. For Review of Systems, all other systems reviewed and are negative.  The following is a summary of the past history medically, past history surgically, known current medicines, social history and family history.  This information is gathered electronically by the computer from prior information and documentation.  I review this each visit and have found including this information at this point in the chart is beneficial and informative.   Past Medical History:  Diagnosis Date   Alcoholism in remission ([place])    quit 12/10/17. longest time alcohol free. reports multiple previous DUIs (04/08/18)   Atrial flutter ([place])    a. diagnosed in 11/2017 during admission for Sepsis b. s/p successful DCCV in 01/2018.   Dysrhythmia    AFib   Pulmonary nodules    Tobacco use     Past Surgical History:  Procedure Laterality Date   CARDIOVERSION N/A 01/24/2018   Procedure: CARDIOVERSION;  Surgeon: Arnoldo Lenis, MD;  [facility];  Service: Endoscopy;  Laterality: N/A;   COLONOSCOPY WITH  PROPOFOL N/A 05/23/2018   Procedure: COLONOSCOPY WITH PROPOFOL;  Surgeon: Daneil Dolin, MD;  [facility];  Service: Endoscopy;  Laterality: N/A;  8:30am   POLYPECTOMY  05/23/2018   Procedure: POLYPECTOMY;  Surgeon: Daneil Dolin, MD;  [facility];  Service: Endoscopy;;  cecal polyp, descending polyp    Current Outpatient Medications on File Prior to Visit  Medication Sig Dispense Refill   atorvastatin (LIPITOR) 20 MG tablet Take 20 mg by mouth daily.     LORazepam (ATIVAN) 1 MG tablet Take 3 tablets PO on day one, then take 2 tablets PO on day two, then take 1 tablet PO on day 3  then STOP 6 tablet 0   No current facility-administered medications on file prior to visit.    Social History   Socioeconomic History   Marital status: Married    Spouse name: Not on file   Number of children: Not on file   Years of education: Not on file   Highest education level: Not on file  Occupational History   Not on file  Tobacco Use   Smoking status: Every Day    Packs/day: 0.75    Years: 35.00    Pack years: 26.25    Types: Cigarettes   Smokeless tobacco: Never  Vaping Use   Vaping Use: Never used  Substance and Sexual Activity   Alcohol use: Yes  Comment: couple beers a day   Drug use: Yes    Types: Marijuana    Comment: last used 01/22/2018   Sexual activity: Not Currently  Other Topics Concern   Not on file  Social History Narrative   Not on file   Social Determinants of Health   Financial Resource Strain: Not on file  Food Insecurity: Not on file  Transportation Needs: Not on file  Physical Activity: Not on file  Stress: Not on file  Social Connections: Not on file  Intimate Partner Violence: Not on file    Family History  Problem Relation Age of Onset   Prostate cancer Father        late 54s   Heart disease Maternal Aunt    Colon cancer Neg Hx     BP 128/77   Pulse 81   Ht 5\' 11"  (1.803 m)   Wt 129 lb (58.5 kg)   BMI 17.99 kg/m    Body mass index is 17.99 kg/m.     Objective:   Physical Exam Vitals and nursing note reviewed. Exam conducted with a chaperone present.  Constitutional:      Appearance: He is well-developed.  HENT:     Head: Normocephalic and atraumatic.  Eyes:     Conjunctiva/sclera: Conjunctivae normal.     Pupils: Pupils are equal, round, and reactive to light.  Cardiovascular:     Rate and Rhythm: Normal rate and regular rhythm.  Pulmonary:     Effort: Pulmonary effort is normal.  Abdominal:     Palpations: Abdomen is soft.  Musculoskeletal:     Cervical back: Normal range of motion and neck supple.       Legs:  Skin:    General: Skin is warm and dry.  Neurological:     Mental Status: He is alert and oriented to person, place, and time.     Cranial Nerves: No cranial nerve deficit.     Motor: No abnormal muscle tone.     Coordination: Coordination normal.     Deep Tendon Reflexes: Reflexes are normal and symmetric. Reflexes normal.  Psychiatric:        Behavior: Behavior normal.        Thought Content: Thought content normal.        Judgment: Judgment normal.  X-rays were done of the left femur and left knee, reported separately.        Assessment & Plan:   Encounter Diagnoses  Name Primary?   Chronic left hip pain Yes   Chronic pain of left knee    Nicotine dependence, cigarettes, uncomplicated    I will give prednisone dose pack and pain medicine.    Return in ten days.  He may need MRI.  Call if any problem.  Precautions discussed.  Electronically Signed Sanjuana Kava, MD 11/17/202211:05 AM

## 2021-03-31 NOTE — Patient Instructions (Signed)

## 2021-03-31 NOTE — Progress Notes (Signed)
Dg knee   

## 2021-04-12 ENCOUNTER — Encounter: Payer: Self-pay | Admitting: Orthopaedic Surgery

## 2021-04-12 ENCOUNTER — Other Ambulatory Visit: Payer: Self-pay

## 2021-04-12 ENCOUNTER — Ambulatory Visit (INDEPENDENT_AMBULATORY_CARE_PROVIDER_SITE_OTHER): Payer: BC Managed Care – PPO | Admitting: Orthopaedic Surgery

## 2021-04-12 VITALS — BP 150/84 | HR 88 | Ht 71.0 in | Wt 131.8 lb

## 2021-04-12 DIAGNOSIS — G8929 Other chronic pain: Secondary | ICD-10-CM | POA: Diagnosis not present

## 2021-04-12 DIAGNOSIS — F17210 Nicotine dependence, cigarettes, uncomplicated: Secondary | ICD-10-CM

## 2021-04-12 DIAGNOSIS — M25552 Pain in left hip: Secondary | ICD-10-CM

## 2021-04-12 DIAGNOSIS — M25562 Pain in left knee: Secondary | ICD-10-CM

## 2021-04-12 NOTE — Progress Notes (Signed)
I feel a little better  He has less pain of the left hip and left knee.  The knee is more of a problem with swelling and popping.  He has no giving way.  He has no numbness, no new trauma.  ROM of the left knee is 0 to 110, pain medially, weakly positive medial McMurray, no distal edema, crepitus and no effusion today.  Slight limp left.  Back is not tender today with good ROM.  Encounter Diagnoses  Name Primary?   Chronic pain of left knee Yes   Chronic left hip pain    Nicotine dependence, cigarettes, uncomplicated    Continue his medicine and knee brace.  Return in five weeks.  Call if any problem.  Precautions discussed.  Electronically Signed Sanjuana Kava, MD 11/29/20228:51 AM

## 2021-04-21 ENCOUNTER — Telehealth: Payer: Self-pay | Admitting: Orthopaedic Surgery

## 2021-04-21 MED ORDER — HYDROCODONE-ACETAMINOPHEN 5-325 MG PO TABS: ORAL_TABLET | ORAL | 0 refills | Status: DC

## 2021-04-21 NOTE — Telephone Encounter (Signed)
Patient called requesting refill for his pain medicine.    HYDROcodone-acetaminophen (NORCO/VICODIN) 5-325 MG tablet  Pharmacy: [facility] in [place]

## 2021-05-17 ENCOUNTER — Other Ambulatory Visit: Payer: Self-pay

## 2021-05-17 ENCOUNTER — Ambulatory Visit (INDEPENDENT_AMBULATORY_CARE_PROVIDER_SITE_OTHER): Payer: BC Managed Care – PPO | Admitting: Orthopaedic Surgery

## 2021-05-17 ENCOUNTER — Encounter: Payer: Self-pay | Admitting: Orthopaedic Surgery

## 2021-05-17 VITALS — Ht 71.0 in | Wt 131.0 lb

## 2021-05-17 DIAGNOSIS — M545 Low back pain, unspecified: Secondary | ICD-10-CM

## 2021-05-17 DIAGNOSIS — M79605 Pain in left leg: Secondary | ICD-10-CM | POA: Diagnosis not present

## 2021-05-17 DIAGNOSIS — F17210 Nicotine dependence, cigarettes, uncomplicated: Secondary | ICD-10-CM | POA: Diagnosis not present

## 2021-05-17 DIAGNOSIS — G8929 Other chronic pain: Secondary | ICD-10-CM

## 2021-05-17 DIAGNOSIS — M25562 Pain in left knee: Secondary | ICD-10-CM

## 2021-05-17 MED ORDER — HYDROCODONE-ACETAMINOPHEN 5-325 MG PO TABS: ORAL_TABLET | ORAL | 0 refills | Status: DC

## 2021-05-17 MED ORDER — PREDNISONE 5 MG (21) PO TBPK
ORAL_TABLET | ORAL | 0 refills | Status: DC
Start: 2021-05-17 — End: 2021-08-30

## 2021-05-17 NOTE — Progress Notes (Signed)
I fell and hurt my back.  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  He hurt his left lower back.  He has had some back pain but the fall made it worse.  He has pain in the lower back going to the left knee but not beyond.  He has no weakness.  He has taken Aleve with help.  He is out of his pain medicine.  He has left knee pain, more medially but the fall did not hurt the knee more.  Exam: Spine/Pelvis examination:  Inspection:  Overall, sacoiliac joint benign and hips nontender; without crepitus or defects.   Thoracic spine inspection: Alignment normal without kyphosis present   Lumbar spine inspection:  Alignment  with normal lumbar lordosis, without scoliosis apparent.   Thoracic spine palpation:  without tenderness of spinal processes   Lumbar spine palpation: without tenderness of lumbar area; without tightness of lumbar muscles    Range of Motion:   Lumbar flexion, forward flexion is normal without pain or tenderness    Lumbar extension is full without pain or tenderness   Left lateral bend is normal without pain or tenderness   Right lateral bend is normal without pain or tenderness   Straight leg raising is normal  Strength & tone: normal   Stability overall normal stability  Left knee has ROM 0 to 110, crepitus, medial pain, weakly positive medial McMurray, limp left, NV intact.  Encounter Diagnoses  Name Primary?   Lumbar pain with radiation down left leg Yes   Chronic pain of left knee    Nicotine dependence, cigarettes, uncomplicated    I will renew his pain medicine.  I will begin prednisone dose pack.  Return in three weeks.  Call if any problem.  Precautions discussed.  Electronically Signed Sanjuana Kava, MD 1/3/20238:27 AM

## 2021-06-07 ENCOUNTER — Ambulatory Visit: Payer: BC Managed Care – PPO

## 2021-06-07 ENCOUNTER — Other Ambulatory Visit: Payer: Self-pay

## 2021-06-07 ENCOUNTER — Encounter: Payer: Self-pay | Admitting: Orthopaedic Surgery

## 2021-06-07 ENCOUNTER — Ambulatory Visit (INDEPENDENT_AMBULATORY_CARE_PROVIDER_SITE_OTHER): Payer: BC Managed Care – PPO | Admitting: Orthopaedic Surgery

## 2021-06-07 VITALS — BP 145/93 | HR 99 | Ht 71.0 in | Wt 118.0 lb

## 2021-06-07 DIAGNOSIS — M545 Low back pain, unspecified: Secondary | ICD-10-CM | POA: Diagnosis not present

## 2021-06-07 DIAGNOSIS — M79605 Pain in left leg: Secondary | ICD-10-CM

## 2021-06-07 MED ORDER — HYDROCODONE-ACETAMINOPHEN 5-325 MG PO TABS: ORAL_TABLET | ORAL | 0 refills | Status: DC

## 2021-06-07 NOTE — Progress Notes (Signed)
My knee is better but my back hurts more.  He has swelling of the left knee at times but not today.  He has pain that radiates from the lower back past the knee to the mid calf and at times to the foot.  The prednisone and pain medicine helped.  He has no new trauma.    Spine/Pelvis examination:  Inspection:  Overall, sacoiliac joint benign and hips nontender; without crepitus or defects.   Thoracic spine inspection: Alignment normal without kyphosis present   Lumbar spine inspection:  Alignment  with normal lumbar lordosis, without scoliosis apparent.   Thoracic spine palpation:  with tenderness of spinal processes   Lumbar spine palpation: with tenderness of lumbar area; without tightness of lumbar muscles    Range of Motion:   Lumbar flexion, forward flexion is 45 without pain or tenderness    Lumbar extension is full without pain or tenderness   Left lateral bend is Normal  without pain or tenderness   Right lateral bend is Normal without pain or tenderness   Straight leg raising is Normal   Strength & tone: Normal   Stability overall normal stability    X-rays were done of the lumbar spine, reported separately.  Encounter Diagnosis  Name Primary?   Lumbar pain with radiation down left leg Yes   I will set up PT for his back.  He may need MRI.  I will refill his pain medicine.  Continue his Aleve.  Return in three weeks.  I have reviewed the [facility] web site prior to prescribing narcotic medicine for this patient.  Call if any problem.  Precautions discussed.  Electronically Signed Sanjuana Kava, MD 1/24/20238:57 AM

## 2021-06-10 DIAGNOSIS — I10 Essential (primary) hypertension: Secondary | ICD-10-CM | POA: Diagnosis not present

## 2021-06-10 DIAGNOSIS — J449 Chronic obstructive pulmonary disease, unspecified: Secondary | ICD-10-CM | POA: Diagnosis not present

## 2021-06-10 DIAGNOSIS — E785 Hyperlipidemia, unspecified: Secondary | ICD-10-CM | POA: Diagnosis not present

## 2021-06-10 DIAGNOSIS — R945 Abnormal results of liver function studies: Secondary | ICD-10-CM | POA: Diagnosis not present

## 2021-06-10 DIAGNOSIS — R634 Abnormal weight loss: Secondary | ICD-10-CM | POA: Diagnosis not present

## 2021-06-13 ENCOUNTER — Encounter: Payer: Self-pay | Admitting: Internal Medicine

## 2021-06-14 ENCOUNTER — Telehealth: Payer: Self-pay | Admitting: Orthopaedic Surgery

## 2021-06-14 MED ORDER — HYDROCODONE-ACETAMINOPHEN 5-325 MG PO TABS: ORAL_TABLET | ORAL | 0 refills | Status: DC

## 2021-06-14 NOTE — Telephone Encounter (Signed)
Patient requests refill HYDROcodone-acetaminophen (NORCO/VICODIN) 5-325 MG tablet  [place]

## 2021-06-22 ENCOUNTER — Ambulatory Visit (HOSPITAL_COMMUNITY): Payer: BC Managed Care – PPO | Attending: Orthopaedic Surgery | Admitting: Physical Therapy

## 2021-06-22 ENCOUNTER — Other Ambulatory Visit: Payer: Self-pay

## 2021-06-22 ENCOUNTER — Encounter (HOSPITAL_COMMUNITY): Payer: Self-pay | Admitting: Physical Therapy

## 2021-06-22 DIAGNOSIS — R945 Abnormal results of liver function studies: Secondary | ICD-10-CM | POA: Diagnosis not present

## 2021-06-22 DIAGNOSIS — M79605 Pain in left leg: Secondary | ICD-10-CM | POA: Insufficient documentation

## 2021-06-22 DIAGNOSIS — M6281 Muscle weakness (generalized): Secondary | ICD-10-CM | POA: Insufficient documentation

## 2021-06-22 DIAGNOSIS — E785 Hyperlipidemia, unspecified: Secondary | ICD-10-CM | POA: Diagnosis not present

## 2021-06-22 DIAGNOSIS — M545 Low back pain, unspecified: Secondary | ICD-10-CM | POA: Diagnosis not present

## 2021-06-22 DIAGNOSIS — R29898 Other symptoms and signs involving the musculoskeletal system: Secondary | ICD-10-CM | POA: Insufficient documentation

## 2021-06-22 DIAGNOSIS — R2689 Other abnormalities of gait and mobility: Secondary | ICD-10-CM | POA: Diagnosis not present

## 2021-06-22 NOTE — Patient Instructions (Signed)
Access Code: [email or web address] URL: [email or web address] Date: 06/22/2021 Prepared by: Mitzi Hansen Mayan Dolney  Exercises Seated Alternating Side Stretch with Arm Overhead - 2 x daily - 7 x weekly - 5 reps - 10 second hold Standing massage with ball at wall on low back - 1 x daily - 7 x weekly - 5 minutes hold Supine Bridge - 1 x daily - 7 x weekly - 2 sets - 10 reps Supine Ankle Pumps - 1 x daily - 7 x weekly - 20 reps

## 2021-06-22 NOTE — Therapy (Signed)
[place], [place], [address] Phone: [phone]   Fax:  [phone]  Physical Therapy Evaluation  Patient Details  Name: Nicholas Avila MRN: [id number] Date of Birth: 1960-07-07 Referring Provider (PT): Sanjuana Kava MD   Encounter Date: 06/22/2021   PT End of Session - 06/22/21 1217     Visit Number 1    Number of Visits 8    Date for PT Re-Evaluation 07/20/21    Authorization Type [employer]    Authorization - Visit Number 1    Authorization - Number of Visits 60    PT Start Time 1130    PT Stop Time 1215    PT Time Calculation (min) 45 min    Activity Tolerance Patient tolerated treatment well    Behavior During Therapy [facility] for tasks assessed/performed             Past Medical History:  Diagnosis Date   Alcoholism in remission ([place])    quit 12/10/17. longest time alcohol free. reports multiple previous DUIs (04/08/18)   Atrial flutter ([place])    a. diagnosed in 11/2017 during admission for Sepsis b. s/p successful DCCV in 01/2018.   Dysrhythmia    AFib   Pulmonary nodules    Tobacco use     Past Surgical History:  Procedure Laterality Date   CARDIOVERSION N/A 01/24/2018   Procedure: CARDIOVERSION;  Surgeon: Arnoldo Lenis, MD;  [facility];  Service: Endoscopy;  Laterality: N/A;   COLONOSCOPY WITH PROPOFOL N/A 05/23/2018   Procedure: COLONOSCOPY WITH PROPOFOL;  Surgeon: Daneil Dolin, MD;  [facility];  Service: Endoscopy;  Laterality: N/A;  8:30am   POLYPECTOMY  05/23/2018   Procedure: POLYPECTOMY;  Surgeon: Daneil Dolin, MD;  [facility];  Service: Endoscopy;;  cecal polyp, descending polyp    There were no vitals filed for this visit.    Subjective Assessment - 06/22/21 1133     Subjective Patient is a 61 y.o. male who presents to physical therapy with c/o LBP. Was told he had arthritis in his hip and knee. 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 Symptoms are off and on. Symptoms aggravated with pulling/lifting pallet jacks at work. Symptoms ease with pain meds. States main goal is to decrease pain.    Limitations Lifting;Standing;Walking;House hold activities    Patient Stated Goals decrease pain    Currently in Pain? No/denies                [facility] PT Assessment - 06/22/21 0001       Assessment   Medical Diagnosis LBP    Referring Provider (PT) Sanjuana Kava MD    Onset Date/Surgical Date 05/25/21      Precautions   Precautions None      Restrictions   Weight Bearing Restrictions No      Balance Screen   Has the patient fallen in the past 6 months Yes    How many times? 1    Has the patient had a decrease in activity level because of a fear of falling?  No    Is the patient reluctant to leave their home because of a fear of falling?  No      Prior Function   Level of Independence Independent    Vocation Full time employment      Cognition   Overall Cognitive Status Within Functional Limits for  tasks assessed      Observation/Other Assessments   Observations Ambulates without AD    Focus on Therapeutic Outcomes (FOTO)  n/a - minimal restriction      ROM / Strength   AROM / PROM / Strength AROM;Strength      AROM   Overall AROM Comments minimal pain/stiffness    AROM Assessment Site Lumbar    Lumbar Flexion 0% limited    Lumbar Extension 25% limited    Lumbar - Right Side Bend 25% limited    Lumbar - Left Side Bend 25% limited    Lumbar - Right Rotation 0% limited    Lumbar - Left Rotation 0% limited      Strength   Strength Assessment Site Hip;Knee;Ankle    Right/Left Hip Right;Left    Right Hip Flexion 4+/5    Right Hip Extension 4-/5    Right Hip ABduction 4/5    Left Hip Flexion 4/5    Left Hip Extension 4-/5    Left Hip ABduction 4/5    Right/Left Knee Right;Left    Right Knee Flexion 5/5    Right Knee Extension 5/5    Left Knee Flexion 5/5    Left Knee  Extension 5/5    Right/Left Ankle Right;Left    Right Ankle Dorsiflexion 5/5    Left Ankle Dorsiflexion 5/5      Palpation   Palpation comment decreased lumbar lordosis, LLE pitting edema; TTP bilateral lumbar paraspinals/QL/ glutes L>R                        Objective measurements completed on examination: See above findings.       [facility] PT Treatment/Exercise - 06/22/21 0001       Exercises   Exercises Lumbar      Lumbar Exercises: Stretches   Other Lumbar Stretch Exercise overhead reach stretch 5x 10 second holds bilateral      Lumbar Exercises: Supine   Bridge 10 reps    Bridge Limitations 2 sets    Other Supine Lumbar Exercises ankle pumps x 10                     PT Education - 06/22/21 1132     Education Details Patient educated on exam findings, POC, scope of PT, HEP, compression garments    Person(s) Educated Patient    Methods Explanation;Demonstration;Handout    Comprehension Verbalized understanding;Returned demonstration              PT Short Term Goals - 06/22/21 1222       PT SHORT TERM GOAL #1   Title Patient will be independent with HEP in order to improve functional outcomes.    Time 2    Period Weeks    Status New    Target Date 07/06/21      PT SHORT TERM GOAL #2   Title Patient will report at least 25% improvement in symptoms for improved quality of life.    Time 2    Period Weeks    Status New    Target Date 07/06/21               PT Long Term Goals - 06/22/21 1223       PT LONG TERM GOAL #1   Title Patient will report at least 75% improvement in symptoms for improved quality of life.    Time 4    Period Weeks    Status  New    Target Date 07/20/21      PT LONG TERM GOAL #2   Title Patient will be able to return to all activities unrestricted for improved ability to perform work functions and participate with family.    Time 4    Period Weeks    Status New    Target Date 07/20/21       PT LONG TERM GOAL #3   Title Patient will demonstrate at least 25% improvement in lumbar ROM in all restricted planes for improved ability to move trunk while completing chores.    Time 4    Period Weeks    Status New    Target Date 07/20/21                    Plan - 06/22/21 1218     Clinical Impression Statement Patient is a 61 y.o. male who presents to physical therapy with c/o LBP. He presents with slight pain limited deficits in lumbar spine strength, ROM, endurance, postural impairments, spinal mobility and functional mobility with ADL. He is having to modify and restrict ADL as indicated by subjective information and objective measures which is affecting overall participation. Patient will benefit from skilled physical therapy in order to improve function and reduce impairment.    Personal Factors and Comorbidities Profession;Time since onset of injury/illness/exacerbation    Examination-Activity Limitations Lift;Squat;Reach Overhead;Carry    Examination-Participation Restrictions Community Activity;Occupation;Yard Work    Designer, [employer]    Rehab Potential Good    PT Frequency 2x / week    PT Duration 4 weeks    PT Treatment/Interventions ADLs/Self Care Home Management;Electrical Stimulation;Traction;Moist Heat;DME Instruction;Gait training;Stair training;Functional mobility training;Therapeutic activities;Therapeutic exercise;Balance training;Neuromuscular re-education;Patient/family education;Orthotic Fit/Training;Manual techniques;Compression bandaging;Scar mobilization;Passive range of motion;Dry needling;Energy conservation;Spinal Manipulations;Joint Manipulations    PT Next Visit Plan f/u with HEP, f/u with compression garments for LLE, core strength, lifting/pushing for work    PT Home Exercise Plan overhead stretch, ankle pumps, bridge, self STM with ball    Consulted and Agree with Plan of Care Patient             Patient will benefit from  skilled therapeutic intervention in order to improve the following deficits and impairments:  Decreased activity tolerance, Decreased strength, Pain, Decreased mobility, Improper body mechanics, Impaired flexibility, Postural dysfunction  Visit Diagnosis: Low back pain, unspecified back pain laterality, unspecified chronicity, unspecified whether sciatica present  Muscle weakness (generalized)  Other abnormalities of gait and mobility  Other symptoms and signs involving the musculoskeletal system     Problem List Patient Active Problem List   Diagnosis Date Noted   Encounter for screening colonoscopy 04/08/2018   Alcoholism in remission ([place]) 04/08/2018   Alcohol withdrawal syndrome with complication, with unspecified complication ([place]) 33/82/5053   Delirium tremens ([place]) 12/13/2017   Tachycardia 12/10/2017   Syncopal episodes 12/10/2017   Abnormal EKG 12/10/2017   Hyperglycemia 12/10/2017   Sepsis ([place]) 12/10/2017   Calcium blood decreased 12/10/2017   Atrial fibrillation, transient ([place]) 12/10/2017   Smoking 12/10/2017    12:25 PM, 06/22/21 Mearl Latin PT, DPT Physical Therapist at [place], [place], [address] Phone: [phone]   Fax:  [phone]  Name: Nicholas Avila MRN: [id number] Date of Birth: 13-Aug-1960

## 2021-06-23 ENCOUNTER — Ambulatory Visit (HOSPITAL_COMMUNITY): Payer: BC Managed Care – PPO | Admitting: Physical Therapy

## 2021-06-23 DIAGNOSIS — R7989 Other specified abnormal findings of blood chemistry: Secondary | ICD-10-CM | POA: Diagnosis not present

## 2021-06-23 DIAGNOSIS — K7030 Alcoholic cirrhosis of liver without ascites: Secondary | ICD-10-CM | POA: Diagnosis not present

## 2021-06-23 DIAGNOSIS — E785 Hyperlipidemia, unspecified: Secondary | ICD-10-CM | POA: Diagnosis not present

## 2021-06-23 DIAGNOSIS — R945 Abnormal results of liver function studies: Secondary | ICD-10-CM | POA: Diagnosis not present

## 2021-06-24 ENCOUNTER — Other Ambulatory Visit: Payer: Self-pay

## 2021-06-24 ENCOUNTER — Ambulatory Visit (HOSPITAL_COMMUNITY): Payer: BC Managed Care – PPO | Admitting: Physical Therapy

## 2021-06-24 DIAGNOSIS — R2689 Other abnormalities of gait and mobility: Secondary | ICD-10-CM | POA: Diagnosis not present

## 2021-06-24 DIAGNOSIS — M545 Low back pain, unspecified: Secondary | ICD-10-CM

## 2021-06-24 DIAGNOSIS — R29898 Other symptoms and signs involving the musculoskeletal system: Secondary | ICD-10-CM

## 2021-06-24 DIAGNOSIS — M6281 Muscle weakness (generalized): Secondary | ICD-10-CM

## 2021-06-24 DIAGNOSIS — M79605 Pain in left leg: Secondary | ICD-10-CM | POA: Diagnosis not present

## 2021-06-24 NOTE — Therapy (Signed)
Nicholas Avila, [place], [address] Phone: [phone]   Fax:  [phone]  Physical Therapy Treatment  Patient Details  Name: Nicholas Avila MRN: [id number] Date of Birth: 04-Nov-1960 Referring Provider (PT): Nicholas Kava MD   Encounter Date: 06/24/2021   PT End of Session - 06/24/21 0844     Visit Number 2    Number of Visits 8    Date for PT Re-Evaluation 07/20/21    Authorization Type BCBS    Authorization - Visit Number 2    Authorization - Number of Visits 60    PT Start Time [phone]    PT Stop Time 0915    PT Time Calculation (min) 40 min    Activity Tolerance Patient tolerated treatment well    Behavior During Therapy [facility] for tasks assessed/performed             Past Medical History:  Diagnosis Date   Alcoholism in remission ([place])    quit 12/10/17. longest time alcohol free. reports multiple previous DUIs (04/08/18)   Atrial flutter ([place])    a. diagnosed in 11/2017 during admission for Sepsis b. s/p successful DCCV in 01/2018.   Dysrhythmia    AFib   Pulmonary nodules    Tobacco use     Past Surgical History:  Procedure Laterality Date   CARDIOVERSION N/A 01/24/2018   Procedure: CARDIOVERSION;  Surgeon: Nicholas Lenis, MD;  [facility];  Service: Endoscopy;  Laterality: N/A;   COLONOSCOPY WITH PROPOFOL N/A 05/23/2018   Procedure: COLONOSCOPY WITH PROPOFOL;  Surgeon: Nicholas Dolin, MD;  [facility];  Service: Endoscopy;  Laterality: N/A;  8:30am   POLYPECTOMY  05/23/2018   Procedure: POLYPECTOMY;  Surgeon: Nicholas Dolin, MD;  [facility];  Service: Endoscopy;;  cecal polyp, descending polyp    There were no vitals filed for this visit.   Subjective Assessment - 06/24/21 0838     Subjective Pt states that he has not done any of the exercises that he was given.    Patient Stated Goals decrease pain    Currently in Pain? Yes    Pain Score 4     Pain  Location Back    Pain Orientation Lower    Pain Descriptors / Indicators Aching    Pain Type Chronic pain    Aggravating Factors  bending, pushing and pulling    Pain Relieving Factors lying down    Effect of Pain on Daily Activities limits                               [facility] Adult PT Treatment/Exercise - 06/24/21 0001       Exercises   Exercises Lumbar      Lumbar Exercises: Stretches   Single Knee to Chest Stretch 3 reps;20 seconds    Lower Trunk Rotation 5 reps    Other Lumbar Stretch Exercise hip excursion x 3      Lumbar Exercises: Standing   Heel Raises 10 reps    Scapular Retraction Strengthening;10 reps    Theraband Level (Scapular Retraction) Level 3 (Green)    Row Strengthening;10 reps    Theraband Level (Row) Level 3 (Green)    Shoulder Extension Strengthening;10 reps;Theraband    Theraband Level (Shoulder Extension) Level 3 (Green)    Other Standing Lumbar Exercises Palof x 10      Lumbar Exercises:  Seated   Other Seated Lumbar Exercises tall posture ; scapular retraction x 10 each      Lumbar Exercises: Supine   Ab Set 10 reps    Bridge 15 reps                     PT Education - 06/24/21 0906     Education Details bed mobility updated HEP    Person(s) Educated Patient    Methods Explanation;Verbal cues;Handout    Comprehension Verbalized understanding;Returned demonstration              PT Short Term Goals - 06/24/21 0850       PT SHORT TERM GOAL #1   Title Patient will be independent with HEP in order to improve functional outcomes.    Time 2    Period Weeks    Status On-going    Target Date 07/06/21      PT SHORT TERM GOAL #2   Title Patient will report at least 25% improvement in symptoms for improved quality of life.    Time 2    Period Weeks    Status On-going    Target Date 07/06/21               PT Long Term Goals - 06/24/21 0850       PT LONG TERM GOAL #1   Title Patient will report at  least 75% improvement in symptoms for improved quality of life.    Time 4    Period Weeks    Status On-going    Target Date 07/20/21      PT LONG TERM GOAL #2   Title Patient will be able to return to all activities unrestricted for improved ability to perform work functions and participate with family.    Time 4    Period Weeks    Status On-going    Target Date 07/20/21      PT LONG TERM GOAL #3   Title Patient will demonstrate at least 25% improvement in lumbar ROM in all restricted planes for improved ability to move trunk while completing chores.    Time 4    Period Weeks    Status On-going    Target Date 07/20/21                   Plan - 06/24/21 0847     Clinical Impression Statement Reviewed pt evaluation and goals.  Began core strengthening, body mechaincs for bed mobility and stretching exercises.  Pt will benefit from postrual strengthening as well as body mechanics for lifting, pushing and pulling    Personal Factors and Comorbidities Profession;Time since onset of injury/illness/exacerbation    Examination-Activity Limitations Lift;Squat;Reach Overhead;Carry    Examination-Participation Restrictions Community Activity;Occupation;Yard Work    Rehab Potential Good    PT Frequency 2x / week    PT Duration 4 weeks    PT Treatment/Interventions ADLs/Self Care Home Management;Electrical Stimulation;Traction;Moist Heat;DME Instruction;Gait training;Stair training;Functional mobility training;Therapeutic activities;Therapeutic exercise;Balance training;Neuromuscular re-education;Patient/family education;Orthotic Fit/Training;Manual techniques;Compression bandaging;Scar mobilization;Passive range of motion;Dry needling;Energy conservation;Spinal Manipulations;Joint Manipulations    PT Next Visit Plan f/u with HEP, f/u with compression garments for LLE, core strength, lifting/pushing for work    PT Home Exercise Plan overhead stretch, ankle pumps, bridge, self STM with  ball;  5/10  knee to chest. trunk rotation and standing extension, ab set    Consulted and Agree with Plan of Care Patient  Patient will benefit from skilled therapeutic intervention in order to improve the following deficits and impairments:  Decreased activity tolerance, Decreased strength, Pain, Decreased mobility, Improper body mechanics, Impaired flexibility, Postural dysfunction  Visit Diagnosis: Low back pain, unspecified back pain laterality, unspecified chronicity, unspecified whether sciatica present  Muscle weakness (generalized)  Other abnormalities of gait and mobility  Other symptoms and signs involving the musculoskeletal system     Problem List Patient Active Problem List   Diagnosis Date Noted   Encounter for screening colonoscopy 04/08/2018   Alcoholism in remission ([place]) 04/08/2018   Alcohol withdrawal syndrome with complication, with unspecified complication ([place]) 00/34/9179   Delirium tremens ([place]) 12/13/2017   Tachycardia 12/10/2017   Syncopal episodes 12/10/2017   Abnormal EKG 12/10/2017   Hyperglycemia 12/10/2017   Sepsis ([place]) 12/10/2017   Calcium blood decreased 12/10/2017   Atrial fibrillation, transient ([place]) 12/10/2017   Smoking 12/10/2017   Rayetta Humphrey, PT CLT [phone]  06/24/2021, 9:14 AM  [place] [address] [place], [place], [address] Phone: [phone]   Fax:  [phone]  Name: Nicholas Avila MRN: [id number] Date of Birth: 13-Mar-1961

## 2021-06-27 ENCOUNTER — Encounter (HOSPITAL_COMMUNITY): Payer: Self-pay | Admitting: Physical Therapy

## 2021-06-27 ENCOUNTER — Other Ambulatory Visit: Payer: Self-pay

## 2021-06-27 ENCOUNTER — Ambulatory Visit (HOSPITAL_COMMUNITY): Payer: BC Managed Care – PPO | Admitting: Physical Therapy

## 2021-06-27 DIAGNOSIS — R29898 Other symptoms and signs involving the musculoskeletal system: Secondary | ICD-10-CM | POA: Diagnosis not present

## 2021-06-27 DIAGNOSIS — M79605 Pain in left leg: Secondary | ICD-10-CM | POA: Diagnosis not present

## 2021-06-27 DIAGNOSIS — M545 Low back pain, unspecified: Secondary | ICD-10-CM

## 2021-06-27 DIAGNOSIS — R2689 Other abnormalities of gait and mobility: Secondary | ICD-10-CM | POA: Diagnosis not present

## 2021-06-27 DIAGNOSIS — M6281 Muscle weakness (generalized): Secondary | ICD-10-CM | POA: Diagnosis not present

## 2021-06-27 NOTE — Patient Instructions (Signed)
Access Code: [email or web address] URL: [email or web address] Date: 06/27/2021 Prepared by: [facility] Addilynne Olheiser  Exercises Standing Shoulder Row with Anchored Resistance - 1 x daily - 7 x weekly - 2 sets - 15 reps Shoulder extension with resistance - Neutral - 1 x daily - 7 x weekly - 2 sets - 15 reps Standing Anti-Rotation Press with Anchored Resistance - 1 x daily - 7 x weekly - 2 sets - 15 reps

## 2021-06-27 NOTE — Therapy (Signed)
[place], [place], [address] Phone: [phone]   Fax:  [phone]  Physical Therapy Treatment  Patient Details  Name: Nicholas Avila MRN: [id number] Date of Birth: May 10, 1961 Referring Provider (PT): Sanjuana Kava MD   Encounter Date: 06/27/2021   PT End of Session - 06/27/21 1047     Visit Number 3    Number of Visits 8    Date for PT Re-Evaluation 07/20/21    Authorization Type BCBS    Authorization - Visit Number 3    Authorization - Number of Visits 60    PT Start Time 3559    PT Stop Time 1128    PT Time Calculation (min) 40 min    Activity Tolerance Patient tolerated treatment well    Behavior During Therapy [facility] for tasks assessed/performed             Past Medical History:  Diagnosis Date   Alcoholism in remission ([place])    quit 12/10/17. longest time alcohol free. reports multiple previous DUIs (04/08/18)   Atrial flutter ([place])    a. diagnosed in 11/2017 during admission for Sepsis b. s/p successful DCCV in 01/2018.   Dysrhythmia    AFib   Pulmonary nodules    Tobacco use     Past Surgical History:  Procedure Laterality Date   CARDIOVERSION N/A 01/24/2018   Procedure: CARDIOVERSION;  Surgeon: Arnoldo Lenis, MD;  [facility];  Service: Endoscopy;  Laterality: N/A;   COLONOSCOPY WITH PROPOFOL N/A 05/23/2018   Procedure: COLONOSCOPY WITH PROPOFOL;  Surgeon: Daneil Dolin, MD;  [facility];  Service: Endoscopy;  Laterality: N/A;  8:30am   POLYPECTOMY  05/23/2018   Procedure: POLYPECTOMY;  Surgeon: Daneil Dolin, MD;  [facility];  Service: Endoscopy;;  cecal polyp, descending polyp    There were no vitals filed for this visit.   Subjective Assessment - 06/27/21 1049     Subjective Patient states at times his back feels tight but feels like it might be getting better.    Patient Stated Goals decrease pain    Currently in Pain? No/denies                                [facility] Adult PT Treatment/Exercise - 06/27/21 0001       Lumbar Exercises: Stretches   Single Knee to Chest Stretch 3 reps;20 seconds    Double Knee to Chest Stretch Limitations 10x 5 second holds with green ball      Lumbar Exercises: Standing   Row Both;15 reps    Theraband Level (Row) Level 3 (Green)    Row Limitations 2 sets    Shoulder Extension Both;15 reps    Theraband Level (Shoulder Extension) Level 3 (Green)    Shoulder Extension Limitations 2 sets    Other Standing Lumbar Exercises Palof 2x15 bilateral      Lumbar Exercises: Prone   Straight Leg Raise 10 reps    Straight Leg Raises Limitations bilateral; 2 sets                     PT Education - 06/27/21 1048     Education Details HEP, exercise mechanics    Person(s) Educated Patient    Methods Explanation;Demonstration    Comprehension Verbalized understanding;Returned demonstration  PT Short Term Goals - 06/24/21 0850       PT SHORT TERM GOAL #1   Title Patient will be independent with HEP in order to improve functional outcomes.    Time 2    Period Weeks    Status On-going    Target Date 07/06/21      PT SHORT TERM GOAL #2   Title Patient will report at least 25% improvement in symptoms for improved quality of life.    Time 2    Period Weeks    Status On-going    Target Date 07/06/21               PT Long Term Goals - 06/24/21 0850       PT LONG TERM GOAL #1   Title Patient will report at least 75% improvement in symptoms for improved quality of life.    Time 4    Period Weeks    Status On-going    Target Date 07/20/21      PT LONG TERM GOAL #2   Title Patient will be able to return to all activities unrestricted for improved ability to perform work functions and participate with family.    Time 4    Period Weeks    Status On-going    Target Date 07/20/21      PT LONG TERM GOAL #3   Title Patient will  demonstrate at least 25% improvement in lumbar ROM in all restricted planes for improved ability to move trunk while completing chores.    Time 4    Period Weeks    Status On-going    Target Date 07/20/21                   Plan - 06/27/21 1048     Clinical Impression Statement Continued with core and postural strengthening which is tolerated well. Educated patient on compression garments for LE edema and gave handout. Patient fatigues quickly with hip extension exercise secondary to glute weakness. Patient will continue to benefit from physical therapy to reduce impairment and improve function.    Personal Factors and Comorbidities Profession;Time since onset of injury/illness/exacerbation    Examination-Activity Limitations Lift;Squat;Reach Overhead;Carry    Examination-Participation Restrictions Community Activity;Occupation;Yard Work    Rehab Potential Good    PT Frequency 2x / week    PT Duration 4 weeks    PT Treatment/Interventions ADLs/Self Care Home Management;Electrical Stimulation;Traction;Moist Heat;DME Instruction;Gait training;Stair training;Functional mobility training;Therapeutic activities;Therapeutic exercise;Balance training;Neuromuscular re-education;Patient/family education;Orthotic Fit/Training;Manual techniques;Compression bandaging;Scar mobilization;Passive range of motion;Dry needling;Energy conservation;Spinal Manipulations;Joint Manipulations    PT Next Visit Plan f/u with HEP, f/u with compression garments for LLE, core strength, lifting/pushing for work    PT Home Exercise Plan overhead stretch, ankle pumps, bridge, self STM with ball;  5/10  knee to chest. trunk rotation and standing extension, ab set 2/13 palof,  Row, extension    Consulted and Agree with Plan of Care Patient             Patient will benefit from skilled therapeutic intervention in order to improve the following deficits and impairments:  Decreased activity tolerance, Decreased  strength, Pain, Decreased mobility, Improper body mechanics, Impaired flexibility, Postural dysfunction  Visit Diagnosis: Low back pain, unspecified back pain laterality, unspecified chronicity, unspecified whether sciatica present  Muscle weakness (generalized)  Other abnormalities of gait and mobility  Other symptoms and signs involving the musculoskeletal system     Problem List Patient Active Problem List   Diagnosis  Date Noted   Encounter for screening colonoscopy 04/08/2018   Alcoholism in remission ([place]) 04/08/2018   Alcohol withdrawal syndrome with complication, with unspecified complication ([place]) 67/89/3810   Delirium tremens ([place]) 12/13/2017   Tachycardia 12/10/2017   Syncopal episodes 12/10/2017   Abnormal EKG 12/10/2017   Hyperglycemia 12/10/2017   Sepsis ([place]) 12/10/2017   Calcium blood decreased 12/10/2017   Atrial fibrillation, transient ([place]) 12/10/2017   Smoking 12/10/2017    11:29 AM, 06/27/21 Mearl Latin PT, DPT Physical Therapist at [place] [address] [place], [place], [address] Phone: [phone]   Fax:  [phone]  Name: JOEANTHONY SEELING MRN: [id number] Date of Birth: Oct 12, 1960

## 2021-06-28 ENCOUNTER — Emergency Department (HOSPITAL_COMMUNITY)
Admission: EM | Admit: 2021-06-28 | Discharge: 2021-06-28 | Discharge status: Against Medical Advice | Payer: BC Managed Care – PPO | Attending: Emergency Medicine | Admitting: Emergency Medicine

## 2021-06-28 ENCOUNTER — Ambulatory Visit (INDEPENDENT_AMBULATORY_CARE_PROVIDER_SITE_OTHER): Payer: BC Managed Care – PPO | Admitting: Orthopaedic Surgery

## 2021-06-28 ENCOUNTER — Encounter: Payer: Self-pay | Admitting: Orthopaedic Surgery

## 2021-06-28 VITALS — BP 112/68 | HR 88 | Ht 71.0 in | Wt 118.0 lb

## 2021-06-28 DIAGNOSIS — M79605 Pain in left leg: Secondary | ICD-10-CM

## 2021-06-28 DIAGNOSIS — M545 Low back pain, unspecified: Secondary | ICD-10-CM

## 2021-06-28 DIAGNOSIS — F17210 Nicotine dependence, cigarettes, uncomplicated: Secondary | ICD-10-CM | POA: Diagnosis not present

## 2021-06-28 DIAGNOSIS — R899 Unspecified abnormal finding in specimens from other organs, systems and tissues: Secondary | ICD-10-CM

## 2021-06-28 MED ORDER — CYCLOBENZAPRINE HCL 10 MG PO TABS: 10.0000 mg | ORAL_TABLET | Freq: Every day | ORAL | 0 refills | Status: DC

## 2021-06-28 MED ORDER — HYDROCODONE-ACETAMINOPHEN 5-325 MG PO TABS: ORAL_TABLET | ORAL | 0 refills | Status: DC

## 2021-06-28 NOTE — ED Notes (Signed)
Pt refusing to be seen. Informed pt that his PCP sent him for his ammonia level being elevated. Pt states "I have another appointment today. I will be back later." Pt alert and oriented at this time.

## 2021-06-28 NOTE — Progress Notes (Signed)
I still have some pain.  He has been to PT for his lower back pain.  He is slightly better but the cold weather and the changes to really warm days then very cold days has made him feel worse.  I have read the PT notes.  He has no new trauma, no weakness.  Spine/Pelvis examination:  Inspection:  Overall, sacoiliac joint benign and hips nontender; without crepitus or defects.   Thoracic spine inspection: Alignment normal without kyphosis present   Lumbar spine inspection:  Alignment  with normal lumbar lordosis, without scoliosis apparent.   Thoracic spine palpation:  without tenderness of spinal processes   Lumbar spine palpation: without tenderness of lumbar area; without tightness of lumbar muscles    Range of Motion:   Lumbar flexion, forward flexion is normal without pain or tenderness    Lumbar extension is full without pain or tenderness   Left lateral bend is normal without pain or tenderness   Right lateral bend is normal without pain or tenderness   Straight leg raising is normal  Strength & tone: normal   Stability overall normal stability  Encounter Diagnoses  Name Primary?   Lumbar pain with radiation down left leg Yes   Nicotine dependence, cigarettes, uncomplicated    I want him to continue PT.  I will add Flexeril.  I have reviewed the [place] web site prior to prescribing narcotic medicine for this patient.  Return in three weeks.  Call if any problem.  Precautions discussed.  Electronically Signed Sanjuana Kava, MD 2/14/20232:30 PM

## 2021-07-01 ENCOUNTER — Other Ambulatory Visit: Payer: Self-pay

## 2021-07-01 ENCOUNTER — Emergency Department (HOSPITAL_COMMUNITY)
Admission: EM | Admit: 2021-07-01 | Discharge: 2021-07-01 | Disposition: A | Discharge status: Home / Self Care | Payer: BC Managed Care – PPO | Attending: Emergency Medicine | Admitting: Emergency Medicine

## 2021-07-01 ENCOUNTER — Encounter (HOSPITAL_COMMUNITY): Payer: Self-pay

## 2021-07-01 DIAGNOSIS — R748 Abnormal levels of other serum enzymes: Secondary | ICD-10-CM | POA: Diagnosis not present

## 2021-07-01 DIAGNOSIS — R945 Abnormal results of liver function studies: Secondary | ICD-10-CM | POA: Insufficient documentation

## 2021-07-01 LAB — CBC WITH DIFFERENTIAL/PLATELET
Abs Immature Granulocytes: 0.03 10*3/uL (ref 0.00–0.07)
Basophils Absolute: 0.1 10*3/uL (ref 0.0–0.1)
Basophils Relative: 1 %
Eosinophils Absolute: 0 10*3/uL (ref 0.0–0.5)
Eosinophils Relative: 1 %
HCT: 29.6 % — ABNORMAL LOW (ref 39.0–52.0)
Hemoglobin: 10.7 g/dL — ABNORMAL LOW (ref 13.0–17.0)
Immature Granulocytes: 1 %
Lymphocytes Relative: 27 %
Lymphs Abs: 1.8 10*3/uL (ref 0.7–4.0)
MCH: 34.3 pg — ABNORMAL HIGH (ref 26.0–34.0)
MCHC: 36.1 g/dL — ABNORMAL HIGH (ref 30.0–36.0)
MCV: 94.9 fL (ref 80.0–100.0)
Monocytes Absolute: 0.5 10*3/uL (ref 0.1–1.0)
Monocytes Relative: 8 %
Neutro Abs: 4.2 10*3/uL (ref 1.7–7.7)
Neutrophils Relative %: 62 %
Platelets: 214 10*3/uL (ref 150–400)
RBC: 3.12 MIL/uL — ABNORMAL LOW (ref 4.22–5.81)
RDW: 17.2 % — ABNORMAL HIGH (ref 11.5–15.5)
WBC: 6.6 10*3/uL (ref 4.0–10.5)
nRBC: 0 % (ref 0.0–0.2)

## 2021-07-01 LAB — COMPREHENSIVE METABOLIC PANEL
ALT: 58 U/L — ABNORMAL HIGH (ref 0–44)
AST: 111 U/L — ABNORMAL HIGH (ref 15–41)
Albumin: 2.3 g/dL — ABNORMAL LOW (ref 3.5–5.0)
Alkaline Phosphatase: 649 U/L — ABNORMAL HIGH (ref 38–126)
Anion gap: 11 (ref 5–15)
BUN: 9 mg/dL (ref 6–20)
CO2: 23 mmol/L (ref 22–32)
Calcium: 7.8 mg/dL — ABNORMAL LOW (ref 8.9–10.3)
Chloride: 102 mmol/L (ref 98–111)
Creatinine, Ser: 0.51 mg/dL — ABNORMAL LOW (ref 0.61–1.24)
GFR, Estimated: >60 mL/min (ref >60)
Glucose, Bld: 118 mg/dL — ABNORMAL HIGH (ref 70–99)
Potassium: 3.4 mmol/L — ABNORMAL LOW (ref 3.5–5.1)
Sodium: 136 mmol/L (ref 135–145)
Total Bilirubin: 1.3 mg/dL — ABNORMAL HIGH (ref 0.3–1.2)
Total Protein: 5.7 g/dL — ABNORMAL LOW (ref 6.5–8.1)

## 2021-07-01 LAB — LIPASE, BLOOD: Lipase: 32 U/L (ref 11–51)

## 2021-07-01 MED ORDER — CALCIUM CARBONATE ANTACID 500 MG PO CHEW
1.0000 | CHEWABLE_TABLET | Freq: Once | ORAL | Status: AC
  Administered 2021-07-01: 200 mg via ORAL
  Filled 2021-07-01: qty 1

## 2021-07-01 NOTE — ED Triage Notes (Signed)
Pt sent by GI doctor, pt unsure exactly reason, possibly Ammonia level high.  Pt denies abdominal pain.  NO n/v/d.  Mild bloating.  Describes himself as steady drinker up until thanksgiving, but occasional drinker since then.

## 2021-07-01 NOTE — ED Notes (Signed)
ED Provider at bedside. 

## 2021-07-01 NOTE — ED Provider Notes (Signed)
[facility] EMERGENCY DEPARTMENT Provider Note   CSN: [id number] Arrival date & time: 07/01/21  4098     History  Chief Complaint  Patient presents with   Abdominal Pain    Abnormal labs    Nicholas Avila is a 61 y.o. male.  Patient presents a chief complaint of abnormal tests.  He said he had blood drawn by his doctor a week ago.  Last Monday he was told to go to the hospital.  He states he had a lot of things to do and never came to the hospital but decided come today.  He has no complaints.  He has no headache no chest pain abdominal pain.  No reports of fevers or cough or vomiting.  He does continue to drink alcohol he states he drinks daily but has cut down.  He typically drinks much more but now only drinks about 3 beers a day.  Denies any confusion, is present with his wife who gives additional history.      Home Medications Prior to Admission medications   Medication Sig Start Date End Date Taking? Authorizing Provider  amLODipine (NORVASC) 10 MG tablet Take 10 mg by mouth daily. 06/10/21   [provider]  atorvastatin (LIPITOR) 20 MG tablet Take 20 mg by mouth daily. 05/05/20   [provider]  cyclobenzaprine (FLEXERIL) 10 MG tablet Take 1 tablet (10 mg total) by mouth at bedtime. One tablet every night at bedtime as needed for spasm. 06/28/21   Sanjuana Kava, MD  HYDROcodone-acetaminophen (NORCO/VICODIN) 5-325 MG tablet One tablet every six hours for pain.  Limit 7 days. 06/28/21   Sanjuana Kava, MD  LORazepam (ATIVAN) 1 MG tablet Take 3 tablets PO on day one, then take 2 tablets PO on day two, then take 1 tablet PO on day 3  then STOP 08/11/20   Ripley Fraise, MD  predniSONE (STERAPRED UNI-PAK 21 TAB) 5 MG (21) TBPK tablet Take 6 pills first day; 5 pills second day; 4 pills third day; 3 pills fourth day; 2 pills next day and 1 pill last day. 05/17/21   Sanjuana Kava, MD      Allergies    Patient has no known allergies.    Review of Systems   Review  of Systems  Constitutional:  Negative for fever.  HENT:  Negative for ear pain and sore throat.   Eyes:  Negative for pain.  Respiratory:  Negative for cough.   Cardiovascular:  Negative for chest pain.  Gastrointestinal:  Negative for abdominal pain.  Genitourinary:  Negative for flank pain.  Musculoskeletal:  Negative for back pain.  Skin:  Negative for color change and rash.  Neurological:  Negative for syncope.  All other systems reviewed and are negative.  Physical Exam Updated Vital Signs BP 116/68    Pulse 72    Temp 98 F (36.7 C) (Oral)    Resp 16    Ht '5\' 11"'  (1.803 m)    Wt 53.5 kg    SpO2 95%    BMI 16.46 kg/m  Physical Exam Constitutional:      Appearance: He is well-developed.  HENT:     Head: Normocephalic.     Nose: Nose normal.  Eyes:     Extraocular Movements: Extraocular movements intact.  Cardiovascular:     Rate and Rhythm: Normal rate.  Pulmonary:     Effort: Pulmonary effort is normal.  Abdominal:     Tenderness: There is no abdominal tenderness. There is no  guarding or rebound.  Skin:    Coloration: Skin is not jaundiced.  Neurological:     Mental Status: He is alert. Mental status is at baseline.    ED Results / Procedures / Treatments   Labs (all labs ordered are listed, but only abnormal results are displayed) Labs Reviewed  CBC WITH DIFFERENTIAL/PLATELET - Abnormal; Notable for the following components:      Result Value   RBC 3.12 (*)    Hemoglobin 10.7 (*)    HCT 29.6 (*)    MCH 34.3 (*)    MCHC 36.1 (*)    RDW 17.2 (*)    All other components within normal limits  COMPREHENSIVE METABOLIC PANEL - Abnormal; Notable for the following components:   Potassium 3.4 (*)    Glucose, Bld 118 (*)    Creatinine, Ser 0.51 (*)    Calcium 7.8 (*)    Total Protein 5.7 (*)    Albumin 2.3 (*)    AST 111 (*)    ALT 58 (*)    Alkaline Phosphatase 649 (*)    Total Bilirubin 1.3 (*)    All other components within normal limits  LIPASE, BLOOD     EKG None  Radiology No results found.  Procedures Procedures    Medications Ordered in ED Medications  calcium carbonate (TUMS - dosed in mg elemental calcium) chewable tablet 200 mg of elemental calcium (has no administration in time range)    ED Course/ Medical Decision Making/ A&P                           Medical Decision Making Amount and/or Complexity of Data Reviewed Labs: ordered.  Risk OTC drugs.   Chart review shows physical therapy appointments February 32,023 on an outpatient basis.  Additional history obtained from his wife at bedside.  Vital signs within normal limits afebrile here in the ER.  Labs are sent.  Potassium mildly low at 3.4 calcium low 7.8.  However his alk phos is significantly elevated at 650.  T. bili near normal at 1.3.  Patient continues to be awake and alert oriented x3 no confusion noted no abdominal pain noted.  No discernible fluid wave on abdominal exam.  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  I do not feel he requires admission as his alk phos level is elevated moderately but he does not have additional symptoms. He does have an appointment with his gastroenterologist scheduled within 1 week which I feel is appropriate.  All questions were answered patient given additional calcium here in the ER he states he is on calcium supplement which I advised him to increase by 50%.  Advising immediate return if he has fevers confusion pain or any additional concerns otherwise keep his appointment with gastroenterology in 1 week.        Final Clinical Impression(s) / ED Diagnoses Final diagnoses:  Elevated liver enzymes    Rx / DC Orders ED Discharge Orders     None         Luna Fuse, MD 07/01/21 [phone]

## 2021-07-01 NOTE — Discharge Instructions (Signed)
Keep your appointment with your GI doctor this month.  Return to the ER if you have pain fevers confusion or any additional concerns.  Increase your calcium intake by 50%.

## 2021-07-12 ENCOUNTER — Ambulatory Visit (HOSPITAL_COMMUNITY): Payer: BC Managed Care – PPO | Admitting: Physical Therapy

## 2021-07-13 ENCOUNTER — Encounter: Payer: Self-pay | Admitting: Orthopaedic Surgery

## 2021-07-14 ENCOUNTER — Ambulatory Visit (HOSPITAL_COMMUNITY): Payer: BC Managed Care – PPO | Admitting: Physical Therapy

## 2021-07-19 ENCOUNTER — Other Ambulatory Visit: Payer: Self-pay

## 2021-07-19 ENCOUNTER — Ambulatory Visit (INDEPENDENT_AMBULATORY_CARE_PROVIDER_SITE_OTHER): Payer: BC Managed Care – PPO | Admitting: Orthopaedic Surgery

## 2021-07-19 ENCOUNTER — Encounter: Payer: Self-pay | Admitting: Orthopaedic Surgery

## 2021-07-19 VITALS — BP 117/71 | HR 83 | Ht 71.0 in | Wt 121.0 lb

## 2021-07-19 DIAGNOSIS — M545 Low back pain, unspecified: Secondary | ICD-10-CM

## 2021-07-19 DIAGNOSIS — F17210 Nicotine dependence, cigarettes, uncomplicated: Secondary | ICD-10-CM | POA: Diagnosis not present

## 2021-07-19 DIAGNOSIS — M79605 Pain in left leg: Secondary | ICD-10-CM

## 2021-07-19 MED ORDER — HYDROCODONE-ACETAMINOPHEN 5-325 MG PO TABS: ORAL_TABLET | ORAL | 0 refills | Status: DC

## 2021-07-19 NOTE — Progress Notes (Signed)
My back is worse. ? ?He has not been able to get to PT yet.  His first appointment is tomorrow. He was playing with his grandchildren over the weekend and had more pain in the back after doing that.  Cold weather also makes it worse.  He has no new trauma, no weakness.  He is trying to do his exercises. He is taking his medicine. ? ?Spine/Pelvis examination: ? Inspection:  Overall, sacoiliac joint benign and hips nontender; without crepitus or defects. ? ? Thoracic spine inspection: Alignment normal without kyphosis present ? ? Lumbar spine inspection:  Alignment  with normal lumbar lordosis, without scoliosis apparent. ? ? Thoracic spine palpation:  without tenderness of spinal processes ? ? Lumbar spine palpation: without tenderness of lumbar area; without tightness of lumbar muscles  ? ? Range of Motion: ?  Lumbar flexion, forward flexion is normal without pain or tenderness  ?  Lumbar extension is full without pain or tenderness ?  Left lateral bend is normal without pain or tenderness ?  Right lateral bend is normal without pain or tenderness ?  Straight leg raising is normal ? Strength & tone: normal ? ? Stability overall normal stability ? ?Encounter Diagnoses  ?Name Primary?  ? Lumbar pain with radiation down left leg Yes  ? Nicotine dependence, cigarettes, uncomplicated   ? ?I will refill pain medicine. ? ?Go to PT. ? ?Consider MRI if not improved. ? ?I have reviewed the [place] web site prior to prescribing narcotic medicine for this patient. ? ?Call if any problem. ? ?Precautions discussed. ? ? ? ?Electronically Signed ?Sanjuana Kava, MD ?3/7/20232:29 PM ? ?

## 2021-07-20 ENCOUNTER — Ambulatory Visit (HOSPITAL_COMMUNITY): Payer: BC Managed Care – PPO | Attending: Orthopaedic Surgery | Admitting: Physical Therapy

## 2021-07-20 ENCOUNTER — Encounter (HOSPITAL_COMMUNITY): Payer: Self-pay | Admitting: Physical Therapy

## 2021-07-20 DIAGNOSIS — R29898 Other symptoms and signs involving the musculoskeletal system: Secondary | ICD-10-CM | POA: Insufficient documentation

## 2021-07-20 DIAGNOSIS — R2689 Other abnormalities of gait and mobility: Secondary | ICD-10-CM | POA: Diagnosis not present

## 2021-07-20 DIAGNOSIS — M545 Low back pain, unspecified: Secondary | ICD-10-CM | POA: Diagnosis not present

## 2021-07-20 DIAGNOSIS — M6281 Muscle weakness (generalized): Secondary | ICD-10-CM | POA: Diagnosis not present

## 2021-07-20 NOTE — Patient Instructions (Signed)
Access Code: [email or web address] ?URL: [email or web address] ?Date: 07/20/2021 ?Prepared by: Mitzi Hansen Laureen Frederic ? ?Exercises ?Seated Pelvic Tilt - 10-20 reps ? ?

## 2021-07-20 NOTE — Therapy (Signed)
[facility] [address] [place], [place], [address] Phone: [phone]   Fax:  [phone]  Physical Therapy Treatment/Discharge Summary  Patient Details  Name: Nicholas Avila MRN: [id number] Date of Birth: 1961/04/24 Referring Provider (PT): Darreld Mclean MD   Encounter Date: 07/20/2021  PHYSICAL THERAPY DISCHARGE SUMMARY  Visits from Start of Care: 4  Current functional level related to goals / functional outcomes: See below   Remaining deficits: See below   Education / Equipment: See below   Patient agrees to discharge. Patient goals were met. Patient is being discharged due to meeting the stated rehab goals.    PT End of Session - 07/20/21 1139     Visit Number 4    Number of Visits 8    Date for PT Re-Evaluation 07/20/21    Authorization Type BC[employer]    Authorization - Visit Number 4    Authorization - Number of Visits 60    PT Start Time 1139    PT Stop Time 1208    PT Time Calculation (min) 29 min    Activity Tolerance Patient tolerated treatment well    Behavior During Therapy WFL for tasks assessed/performed             Past Medical History:  Diagnosis Date   Alcoholism in remission (HCC)    quit 12/10/17. longest time alcohol free. reports multiple previous DUIs (04/08/18)   Atrial flutter (HCC)    a. diagnosed in 11/2017 during admission for Sepsis b. s/p successful DCCV in 01/2018.   Dysrhythmia    AFib   Pulmonary nodules    Tobacco use     Past Surgical History:  Procedure Laterality Date   CARDIOVERSION N/A 01/24/2018   Procedure: CARDIOVERSION;  Surgeon: Antoine Poche, MD;  [facility];  Service: Endoscopy;  Laterality: N/A;   COLONOSCOPY WITH PROPOFOL N/A 05/23/2018   Procedure: COLONOSCOPY WITH PROPOFOL;  Surgeon: Corbin Ade, MD;  [facility];  Service: Endoscopy;  Laterality: N/A;  8:30am   POLYPECTOMY  05/23/2018   Procedure: POLYPECTOMY;  Surgeon: Corbin Ade, MD;  Location: [facility];  Service: Endoscopy;;  cecal polyp, descending polyp    There were no vitals filed for this visit.   Subjective Assessment - 07/20/21 1140     Subjective Been working on knee stretches and band exercises. He continues to have stiffness after relaxing. Symptoms off and on when working. Patient states 75% improvement in symptoms since beginning therapy. Remaining deficit due to stiffness and pain.    Patient Stated Goals decrease pain    Currently in Pain? No/denies                [facility] PT Assessment - 07/20/21 0001       Assessment   Medical Diagnosis LBP    Referring Provider (PT) Darreld Mclean MD    Onset Date/Surgical Date 05/25/21      Precautions   Precautions None      Restrictions   Weight Bearing Restrictions No      Balance Screen   Has the patient fallen in the past 6 months Yes    How many times? 1    Has the patient had a decrease in activity level because of a fear of falling?  No    Is the patient reluctant to leave their home because of a fear of falling?  No      Prior Function  Level of Independence Independent    Vocation Full time employment      Cognition   Overall Cognitive Status Within Functional Limits for tasks assessed      Observation/Other Assessments   Observations Ambulates without AD    Focus on Therapeutic Outcomes (FOTO)  n/a - minimal restriction      AROM   Lumbar Flexion 0% limited    Lumbar Extension 0% limited    Lumbar - Right Side Bend 0% limited    Lumbar - Left Side Bend 0% limited    Lumbar - Right Rotation 0% limited    Lumbar - Left Rotation 0% limited      Strength   Right Hip Flexion 4+/5    Right Hip Extension 4/5    Right Hip ABduction 4+/5    Left Hip Flexion 4/5    Left Hip Extension 4/5    Left Hip ABduction 4+/5                           [facility] Adult PT Treatment/Exercise - 07/20/21 0001       Lumbar Exercises: Seated   Other Seated Lumbar  Exercises Pelvic tilts x 10                     PT Education - 07/20/21 1140     Education Details HEP, reassessment findings, POC    Person(s) Educated Patient    Methods Explanation;Demonstration    Comprehension Verbalized understanding;Returned demonstration              PT Short Term Goals - 07/20/21 1144       PT SHORT TERM GOAL #1   Title Patient will be independent with HEP in order to improve functional outcomes.    Time 2    Period Weeks    Status Achieved    Target Date 07/06/21      PT SHORT TERM GOAL #2   Title Patient will report at least 25% improvement in symptoms for improved quality of life.    Time 2    Period Weeks    Status Achieved    Target Date 07/06/21               PT Long Term Goals - 07/20/21 1146       PT LONG TERM GOAL #1   Title Patient will report at least 75% improvement in symptoms for improved quality of life.    Time 4    Period Weeks    Status Achieved    Target Date 07/20/21      PT LONG TERM GOAL #2   Title Patient will be able to return to all activities unrestricted for improved ability to perform work functions and participate with family.    Time 4    Period Weeks    Status Partially Met    Target Date 07/20/21      PT LONG TERM GOAL #3   Title Patient will demonstrate at least 25% improvement in lumbar ROM in all restricted planes for improved ability to move trunk while completing chores.    Time 4    Period Weeks    Status Achieved    Target Date 07/20/21                   Plan - 07/20/21 1140     Clinical Impression Statement Patient has met all short and long term goals  with ability to complete HEP and improvement in symptoms, activity tolerance, ROM, and functional mobility. Patient remains limited with intermittent lumbar symptoms/stiffness following periods of activity and then sitting for extended periods. Reviewed HEP with patient and educated on pelvic tilts to perform while  sitting for mobility and core strength. Patient able to perform with good mechanics. Patient feels ready for d/c as symptoms are minimal and intermittent. Patient discharged from physical therapy.    Personal Factors and Comorbidities Profession;Time since onset of injury/illness/exacerbation    Examination-Activity Limitations Lift;Squat;Reach Overhead;Carry    Examination-Participation Restrictions Community Activity;Occupation;Yard Work    Rehab Potential Good    PT Frequency --    PT Duration --    PT Treatment/Interventions ADLs/Self Care Home Management;Electrical Stimulation;Traction;Moist Heat;DME Instruction;Gait training;Stair training;Functional mobility training;Therapeutic activities;Therapeutic exercise;Balance training;Neuromuscular re-education;Patient/family education;Orthotic Fit/Training;Manual techniques;Compression bandaging;Scar mobilization;Passive range of motion;Dry needling;Energy conservation;Spinal Manipulations;Joint Manipulations    PT Next Visit Plan n/a    PT Home Exercise Plan overhead stretch, ankle pumps, bridge, self STM with ball;  5/10  knee to chest. trunk rotation and standing extension, ab set 2/13 palof,  Row, extension    Consulted and Agree with Plan of Care Patient             Patient will benefit from skilled therapeutic intervention in order to improve the following deficits and impairments:  Decreased activity tolerance, Decreased strength, Pain, Decreased mobility, Improper body mechanics, Impaired flexibility, Postural dysfunction  Visit Diagnosis: Low back pain, unspecified back pain laterality, unspecified chronicity, unspecified whether sciatica present  Muscle weakness (generalized)  Other abnormalities of gait and mobility  Other symptoms and signs involving the musculoskeletal system     Problem List Patient Active Problem List   Diagnosis Date Noted   Encounter for screening colonoscopy 04/08/2018   Alcoholism in remission  (HCC) 04/08/2018   Alcohol withdrawal syndrome with complication, with unspecified complication (HCC) 12/13/2017   Delirium tremens (HCC) 12/13/2017   Tachycardia 12/10/2017   Syncopal episodes 12/10/2017   Abnormal EKG 12/10/2017   Hyperglycemia 12/10/2017   Sepsis (HCC) 12/10/2017   Calcium blood decreased 12/10/2017   Atrial fibrillation, transient (HCC) 12/10/2017   Smoking 12/10/2017    12:15 PM, 07/20/21 Wyman Songster PT, DPT Physical Therapist at [facility]   [facility] [address] [place], [place], [address] Phone: [phone]   Fax:  [phone]  Name: Nicholas Avila MRN: [id number] Date of Birth: [birth year]

## 2021-07-22 ENCOUNTER — Encounter (HOSPITAL_COMMUNITY): Payer: BC Managed Care – PPO | Admitting: Physical Therapy

## 2021-08-02 ENCOUNTER — Ambulatory Visit (INDEPENDENT_AMBULATORY_CARE_PROVIDER_SITE_OTHER): Payer: BC Managed Care – PPO | Admitting: Orthopaedic Surgery

## 2021-08-02 ENCOUNTER — Encounter: Payer: Self-pay | Admitting: Orthopaedic Surgery

## 2021-08-02 ENCOUNTER — Other Ambulatory Visit: Payer: Self-pay

## 2021-08-02 DIAGNOSIS — M545 Low back pain, unspecified: Secondary | ICD-10-CM | POA: Diagnosis not present

## 2021-08-02 DIAGNOSIS — M79605 Pain in left leg: Secondary | ICD-10-CM | POA: Diagnosis not present

## 2021-08-02 DIAGNOSIS — F17210 Nicotine dependence, cigarettes, uncomplicated: Secondary | ICD-10-CM

## 2021-08-02 MED ORDER — HYDROCODONE-ACETAMINOPHEN 5-325 MG PO TABS: ORAL_TABLET | ORAL | 0 refills | Status: DC

## 2021-08-02 NOTE — Patient Instructions (Signed)
While we are working on your approval for MRI please go ahead and call to schedule your appointment with [facility] within at least 2 weeks ? ?Central Scheduling ?[phone]  ?

## 2021-08-02 NOTE — Progress Notes (Signed)
My back is hurting still. ? ?He has been to PT and has been doing his exercises.  He still has lower back pain with paresthesias to the left leg.  He has been taking his medicine and not improving.  I will get MRI.  He has no new trauma, no weakness. ? ?Spine/Pelvis examination: ? Inspection:  Overall, sacoiliac joint benign and hips nontender; without crepitus or defects. ? ? Thoracic spine inspection: Alignment normal without kyphosis present ? ? Lumbar spine inspection:  Alignment  with normal lumbar lordosis, without scoliosis apparent. ? ? Thoracic spine palpation:  without tenderness of spinal processes ? ? Lumbar spine palpation: without tenderness of lumbar area; without tightness of lumbar muscles  ? ? Range of Motion: ?  Lumbar flexion, forward flexion is normal without pain or tenderness  ?  Lumbar extension is full without pain or tenderness ?  Left lateral bend is normal without pain or tenderness ?  Right lateral bend is normal without pain or tenderness ?  Straight leg raising is normal ? Strength & tone: normal ? ? Stability overall normal stability ? ?I have reviewed the PT notes. ? ?I will schedule for MRI as he has not improved. ? ?Encounter Diagnoses  ?Name Primary?  ? Lumbar pain with radiation down left leg Yes  ? Nicotine dependence, cigarettes, uncomplicated   ? ?Return in two weeks after MRI. ? ?I have reviewed the [facility] web site prior to prescribing narcotic medicine for this patient. ? ?Call if any problem. ? ?Precautions discussed. ? ?Electronically Signed ?Sanjuana Kava, MD ?3/21/20232:55 PM ? ?

## 2021-08-16 ENCOUNTER — Ambulatory Visit: Payer: BC Managed Care – PPO | Admitting: Orthopaedic Surgery

## 2021-08-18 ENCOUNTER — Telehealth: Payer: Self-pay | Admitting: Orthopaedic Surgery

## 2021-08-18 NOTE — Telephone Encounter (Signed)
Patient called at 1:34 pm and left voicemail wanting to know about his refill on his pain medicine.  ? ?I called patient back he said he called it in Tuesday and it still isn't at the pharmacy.  ? ? ?HYDROcodone-acetaminophen (NORCO/VICODIN) 5-325 MG tablet ? ?Pharmacy: [facility] in [place]  ? ?

## 2021-08-19 ENCOUNTER — Ambulatory Visit (HOSPITAL_COMMUNITY)
Admission: RE | Admit: 2021-08-19 | Discharge: 2021-08-19 | Disposition: A | Discharge status: Home / Self Care | Payer: BC Managed Care – PPO | Source: Ambulatory Visit | Attending: Orthopaedic Surgery | Admitting: Orthopaedic Surgery

## 2021-08-19 DIAGNOSIS — M79605 Pain in left leg: Secondary | ICD-10-CM | POA: Insufficient documentation

## 2021-08-19 DIAGNOSIS — M545 Low back pain, unspecified: Secondary | ICD-10-CM | POA: Insufficient documentation

## 2021-08-19 IMAGING — MR MR LUMBAR SPINE W/O CM
5 series · 31 of 48 positions shown · non-contrast
Comparison: Lumbar radiographs [DATE].

CLINICAL DATA: 60-year-old male with persistent low back pain
following an injury 2 months ago.

EXAM:
MRI LUMBAR SPINE WITHOUT CONTRAST
TECHNIQUE: Multiplanar, multisequence MR imaging of the lumbar spine was
performed. No intravenous contrast was administered.

[Series 5: T2 · sagittal · 4.0mm · 0.68mm/px · 7 of 15 slices shown (1 of 2)]
[im 1/15]
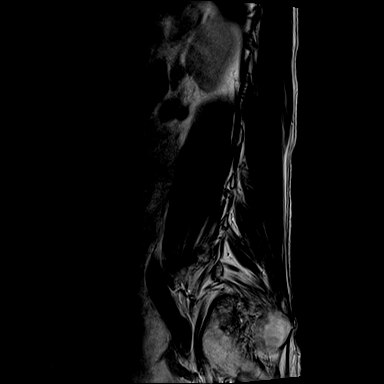
[im 3/15]
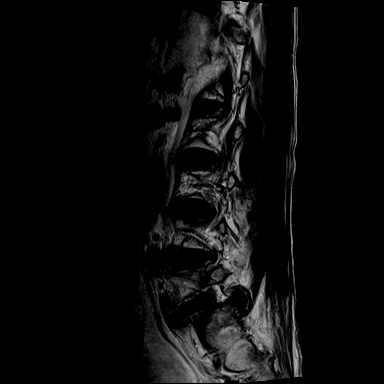
[im 5/15]
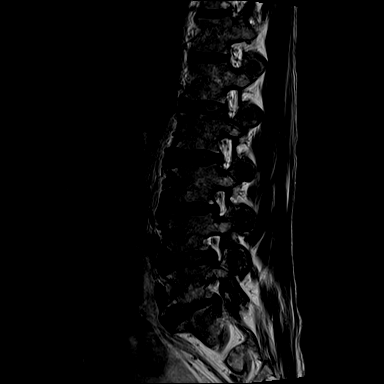
[im 8/15]
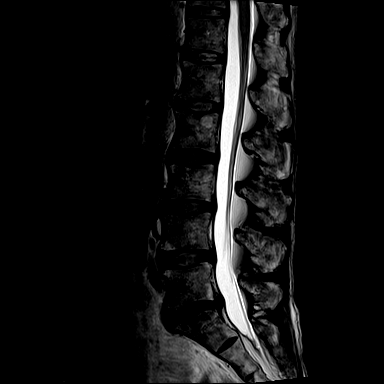
[im 10/15]
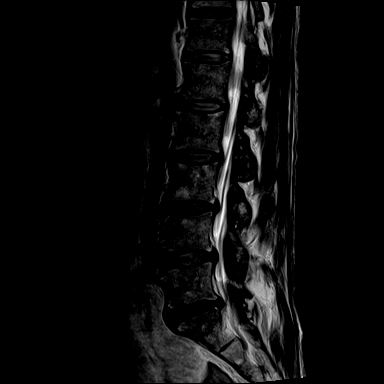
[im 12/15]
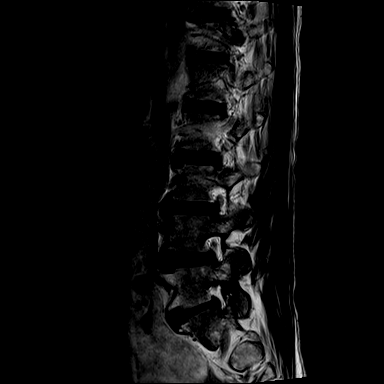
[im 15/15]
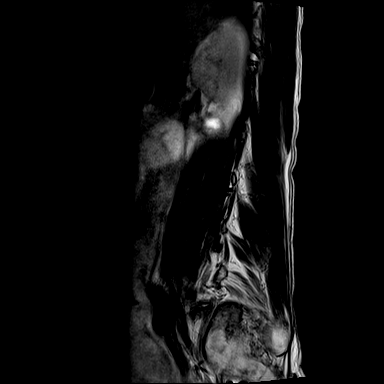

[Series 6: T1 · sagittal · 4.0mm · 0.81mm/px · 7 of 15 slices shown (1 of 2)]
[im 1/15]
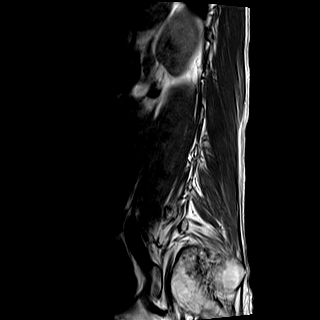
[im 3/15]
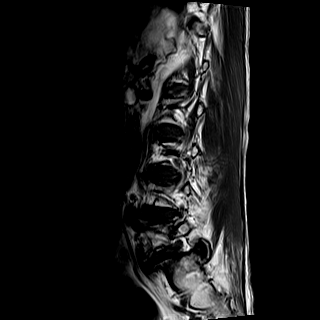
[im 5/15]
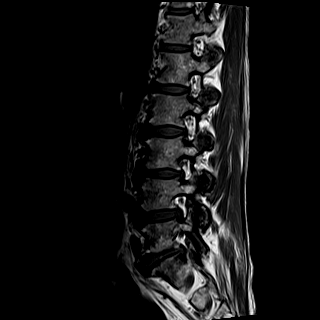
[im 8/15]
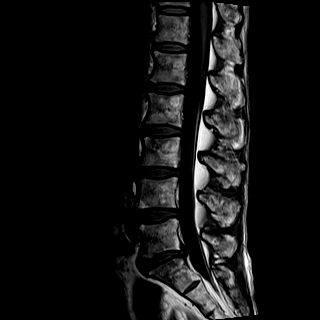
[im 10/15]
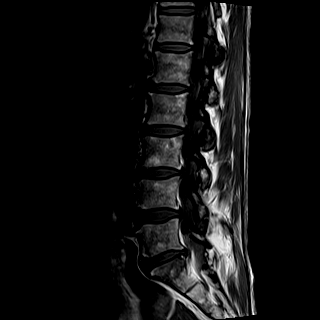
[im 12/15]
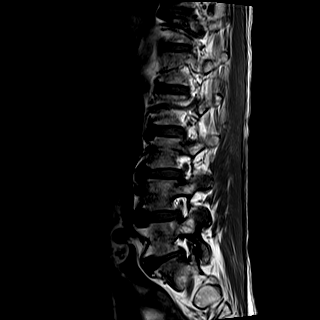
[im 15/15]
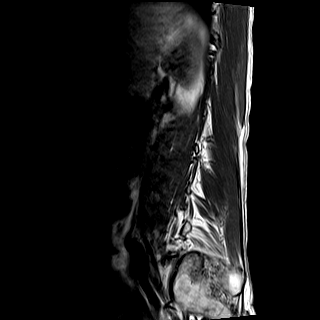

[Series 7: STIR · sagittal · 4.0mm · 0.51mm/px · 1 of 15 slices shown]
[im 1/15]
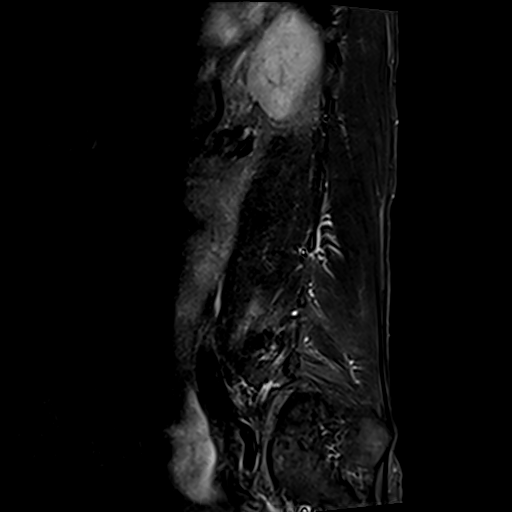

[Series 8: T2 · axial · 4.0mm · 0.70mm/px · z∈[-80,+109]mm · 8 of 34 slices shown (2 of 2)]
[im 1/34]
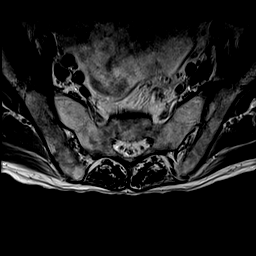
[im 6/34]
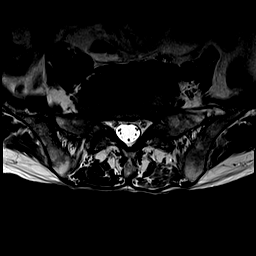
[im 11/34]
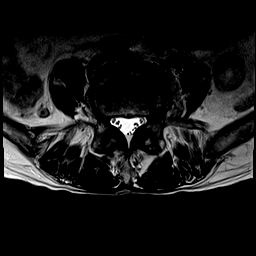
[im 16/34]
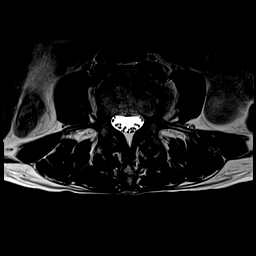
[im 18/34]
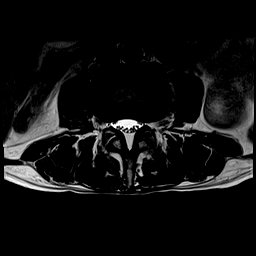
[im 23/34]
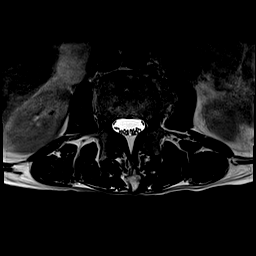
[im 28/34]
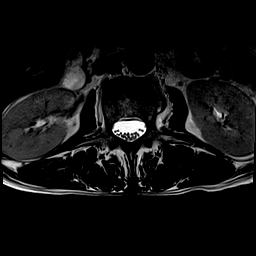
[im 34/34]
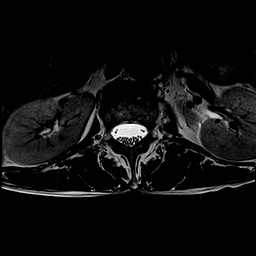

[Series 9: T1 · axial · 4.0mm · 0.35mm/px · z∈[-80,+109]mm · 8 of 34 slices shown (2 of 2)]
[im 1/34]
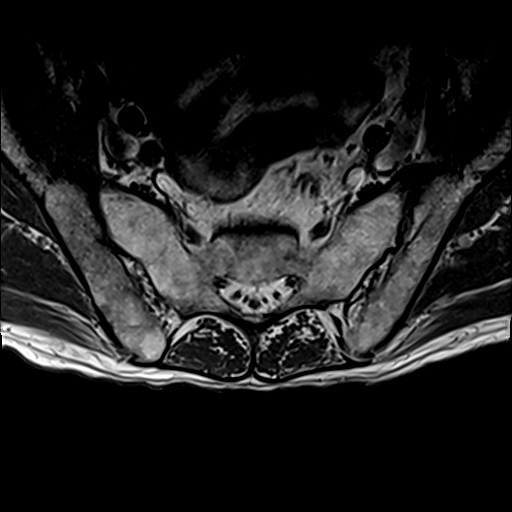
[im 6/34]
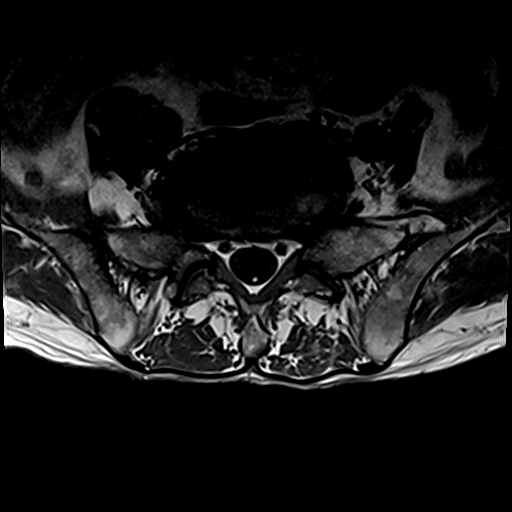
[im 11/34]
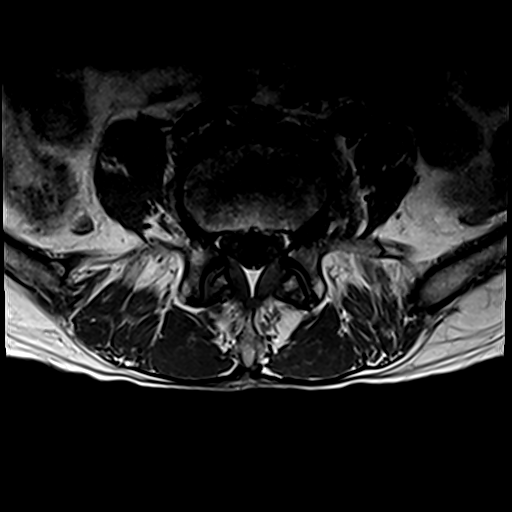
[im 16/34]
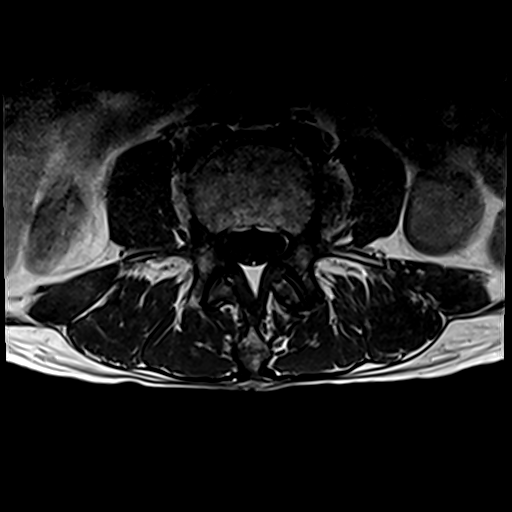
[im 18/34]
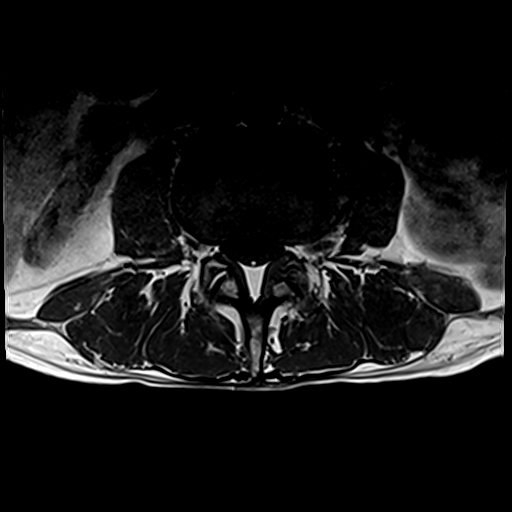
[im 23/34]
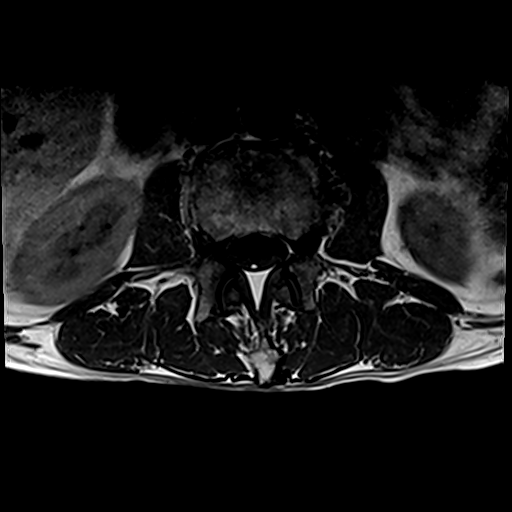
[im 28/34]
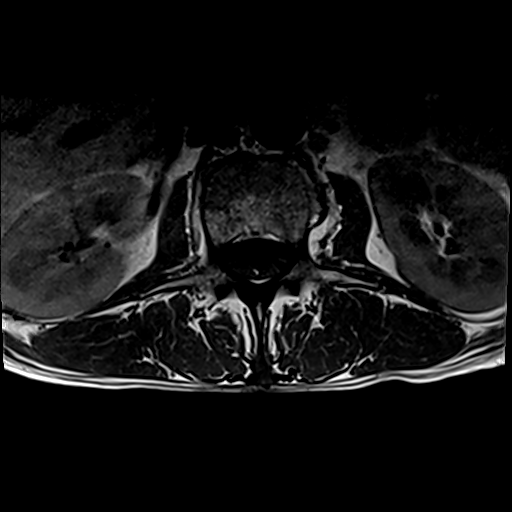
[im 34/34]
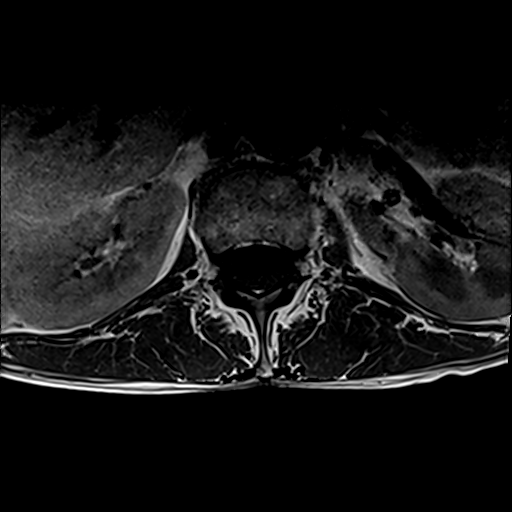

[31 of 48 positions shown; findings below may reference images not displayed]

FINDINGS: Segmentation:  Normal on the comparison.

Alignment: Straightening of the normal cervical lordosis appears
stable since [REDACTED]. No spondylolisthesis. No scoliosis.

Vertebrae: No marrow edema or evidence of acute osseous abnormality.
Visualized bone marrow signal is within normal limits. Intact
visible sacrum

Conus medullaris and cauda equina: Conus extends to the T12-L1
level. No lower spinal cord or conus signal abnormality. Fairly
Capacious spinal canal. Normal cauda equina nerve roots.

Paraspinal and other soft tissues: Subtle asymmetric lateral
paraspinal soft tissue inflammation on the left just above the L4-L5
disc level (series 7, image 14). Psoas muscle there remains normal.

Otherwise the visualized abdominal viscera and paraspinal soft
tissues are within normal limits.

Disc levels:

T11-T12: Negative.

T12-L1:  Negative.

L1-L2:  Negative.

L2-L3: Mild far lateral disc bulging greater on the left. No
stenosis.

L3-L4: Mild disc desiccation. Circumferential but mostly far lateral
disc bulging greater on the left with mild foraminal endplate
spurring. Mild facet hypertrophy but no spinal or lateral recess
stenosis. Mild L3 neural foraminal stenosis greater on the left.

L4-L5: Far lateral and especially left side bulky broad-based disc
protrusion and endplate spurring (series 8, image 23). Mild facet
and ligament flavum hypertrophy. No spinal or convincing lateral
recess stenosis. Mild foraminal and at least moderate extraforaminal
left L4 nerve level stenosis. Mild right foraminal stenosis.

L5-S1: Disc desiccation and disc space loss. Circumferential disc
bulge and endplate spurring with broad-based posterior component.
Mild facet and ligament flavum hypertrophy. No spinal or convincing
lateral recess stenosis. Moderate left and mild right L5 foraminal
stenosis.
IMPRESSION: 1. Lateral disc herniation and endplate spurring L3-L4 through L5-S1
generally greater on the left side. Faint degenerative appearing
soft tissue edema lateral to the left L4 body. Associated mild to
moderate left side foraminal stenosis. Query left L3 and/or L4
and/or L5 radiculitis.

2. Capacious underlying spinal canal. No lumbar spinal or lateral
recess stenosis.

## 2021-08-22 MED ORDER — HYDROCODONE-ACETAMINOPHEN 5-325 MG PO TABS: ORAL_TABLET | ORAL | 0 refills | Status: DC

## 2021-08-30 ENCOUNTER — Encounter: Payer: Self-pay | Admitting: Orthopaedic Surgery

## 2021-08-30 ENCOUNTER — Ambulatory Visit (INDEPENDENT_AMBULATORY_CARE_PROVIDER_SITE_OTHER): Payer: BC Managed Care – PPO | Admitting: Orthopaedic Surgery

## 2021-08-30 DIAGNOSIS — M79605 Pain in left leg: Secondary | ICD-10-CM | POA: Diagnosis not present

## 2021-08-30 DIAGNOSIS — M545 Low back pain, unspecified: Secondary | ICD-10-CM | POA: Diagnosis not present

## 2021-08-30 MED ORDER — HYDROCODONE-ACETAMINOPHEN 5-325 MG PO TABS: ORAL_TABLET | ORAL | 0 refills | Status: DC

## 2021-08-30 NOTE — Patient Instructions (Signed)
[facility] ? ?[address] ?[address] [place], [place] [address] ? ?PHONE ?[phone] ?

## 2021-08-30 NOTE — Progress Notes (Signed)
My back is hurting more. ? ?He has more back pain.  He had MRI which showed: ?IMPRESSION: ?1. Lateral disc herniation and endplate spurring W4-O9 through L5-S1 ?generally greater on the left side. Faint degenerative appearing ?soft tissue edema lateral to the left L4 body. Associated mild to ?moderate left side foraminal stenosis. Query left L3 and/or L4 ?and/or L5 radiculitis. ?  ?2. Capacious underlying spinal canal. No lumbar spinal or lateral ?recess stenosis. ?  ?I have explained findings to him.  I will have him see neurosurgery.  He is agreeable. ? ?His back is tender, no spasm,  ROM is good, NV intact, DTRs normal, no weakness, gait is good. ? ?Encounter Diagnosis  ?Name Primary?  ? Lumbar pain with radiation down left leg Yes  ? ?He may benefit from epidural. ? ?See neurosurgery. ? ?I have independently reviewed the MRI.   ? ?I have reviewed the [facility] web site prior to prescribing narcotic medicine for this patient. ? ?I refilled pain medicine. ? ?Call if any problem. ? ?Precautions discussed. ? ?Electronically Signed ?Sanjuana Kava, MD ?4/18/20232:39 PM ? ?

## 2021-09-19 DIAGNOSIS — M5440 Lumbago with sciatica, unspecified side: Secondary | ICD-10-CM | POA: Diagnosis not present

## 2021-09-21 ENCOUNTER — Other Ambulatory Visit: Payer: Self-pay | Admitting: Radiology

## 2021-09-21 NOTE — Telephone Encounter (Signed)
Voice mail from patient requesting refill pain meds.   ?

## 2021-09-22 MED ORDER — HYDROCODONE-ACETAMINOPHEN 5-325 MG PO TABS: ORAL_TABLET | ORAL | 0 refills | Status: DC

## 2021-10-02 NOTE — Progress Notes (Signed)
Referring Provider:  [name], MD Primary Care Physician:  [name], MD Primary Gastroenterologist:  Dr. Gala Romney  Chief Complaint  Patient presents with   Follow-up    HPI:   Nicholas Avila is a 61 y.o. male presenting today at the request of Dr. Legrand Rams for abnormal LFTs.  History of alcohol abuse.  Reviewed labs in the chart.  On 07/01/2021, alk phos 649, total bilirubin 1.3, AST 111, ALT 58, albumin 2.3.  Hemoglobin 10.7 with macrocytic indices, platelets 214.  Previously in March 2022, alk phos 170, AST 75, ALT 42, total bilirubin 0.6, albumin 3.9 hemoglobin 12.5.  Hemoglobin has been in the 12 range at least since 2019.  No abdominal imaging on file.  Today:  No abdominal pain. Intermittent gas/bloating, relieved by passing gas or having a BM. No abdominal distension.  He has noticed change in bowel habits over the last couple of months.  We will have a formed stool during the day, he also has multiple small volume bowel movements throughout the day.  States is every time he has to urinate.  Also waking a lot at night to have bowel movements.  Stools can be watery.  Prior to this, will have 1 soft, formed bowel movement daily. No brbpr or melena.  Denies any recent antibiotics, hospitalizations, well water, or sick contacts.  No recent medication changes.  Reports 10 lb weight loss over the last few months.  Per chart review,r weight has been stable the last 4 months or so, but it does appear he lost about 13 lbs in January 2023.   Few months ago, he didn't have a good appetite. Not sure what happened. No nausea or vomiting. Was weak. Appetite has returned.  States he is eating " around-the-clock", but not able to gain weight.   No GERD or dysphagia.   Alcohol:  Currently drinking beer only: 3-4, 12 oz can daily. Used to drink bootlegers and vodka at social gathering.   No Hx of drug use.  No tattoos. No known hepatitis exposure. No OTC supplements  or herbal teas.  No Fhx of liver disease No autoimmune disease.    Colonoscopy 05/23/2018: 6 mm tubular adenoma in the descending colon removed with hot snare and SKIP, sessile hyperplastic polyp in the cecum.  Nonbleeding internal hemorrhoids.  Due to location, size, and type of removal required, recommended colonoscopy in 6 months.   Past Medical History:  Diagnosis Date   Alcoholism in remission ([place])    quit 12/10/17. longest time alcohol free. reports multiple previous DUIs (04/08/18)   Atrial flutter ([place])    a. diagnosed in 11/2017 during admission for Sepsis b. s/p successful DCCV in 01/2018.   Dysrhythmia    AFib   Pulmonary nodules    Tobacco use     Past Surgical History:  Procedure Laterality Date   CARDIOVERSION N/A 01/24/2018   Procedure: CARDIOVERSION;  Surgeon: Arnoldo Lenis, MD;  [facility];  Service: Endoscopy;  Laterality: N/A;   COLONOSCOPY WITH PROPOFOL N/A 05/23/2018   Procedure: COLONOSCOPY WITH PROPOFOL;  Surgeon: Daneil Dolin, MD;  Location: [facility];  Service: Endoscopy;  Laterality: N/A;  8:30am   POLYPECTOMY  05/23/2018   Procedure: POLYPECTOMY;  Surgeon: Daneil Dolin, MD;  Location: [facility];  Service: Endoscopy;;  cecal polyp, descending polyp    Current Outpatient Medications  Medication Sig Dispense Refill   amLODipine (NORVASC) 10 MG tablet Take 10 mg by mouth  daily.     calcium carbonate (OS-CAL) 1250 (500 Ca) MG chewable tablet Chew 1 tablet by mouth daily.     HYDROcodone-acetaminophen (NORCO/VICODIN) 5-325 MG tablet One tablet every six hours for pain.  Limit 7 days. 28 tablet 0   polyethylene glycol-electrolytes (TRILYTE) 420 g solution Take 4,000 mLs by mouth as directed. 4000 mL 0   No current facility-administered medications for this visit.    Allergies as of 10/03/2021   (No Known Allergies)    Family History  Problem Relation Age of Onset   Prostate cancer Father        late 65s   Heart disease  Maternal Aunt    Colon cancer Neg Hx    Liver disease Neg Hx     Social History   Socioeconomic History   Marital status: Married    Spouse name: Not on file   Number of children: Not on file   Years of education: Not on file   Highest education level: Not on file  Occupational History   Not on file  Tobacco Use   Smoking status: Every Day    Packs/day: 0.75    Years: 35.00    Pack years: 26.25    Types: Cigarettes   Smokeless tobacco: Never  Vaping Use   Vaping Use: Never used  Substance and Sexual Activity   Alcohol use: Yes    Comment: 3-4, 12 oz beer per day.   Drug use: Yes    Types: Marijuana    Comment: last used 01/22/2018   Sexual activity: Not Currently  Other Topics Concern   Not on file  Social History Narrative   Not on file   Social Determinants of Health   Financial Resource Strain: Not on file  Food Insecurity: Not on file  Transportation Needs: Not on file  Physical Activity: Not on file  Stress: Not on file  Social Connections: Not on file  Intimate Partner Violence: Not on file    Review of Systems: Gen: Denies any fever, chills, cold or flulike symptoms, presyncope, syncope. CV: Denies chest pain, heart palpitations.  Resp: Denies shortness of breath, cough. GI: See HPI GU : Denies urinary burning, urinary frequency, urinary hesitancy MS: Denies joint pain. Derm: Denies rash. Psych: Denies depression, anxiety. Heme: See HPI  Physical Exam: BP 122/68   Pulse 98   Temp 97.9 F (36.6 C) (Temporal)   Ht '5\' 10"'  (1.778 m)   Wt 119 lb 12.8 oz (54.3 kg)   BMI 17.19 kg/m  General:   Alert and oriented. Pleasant and cooperative. Well-nourished and well-developed.  Head:  Normocephalic and atraumatic. Eyes:  Without icterus, sclera clear and conjunctiva pink.  Ears:  Normal auditory acuity. Lungs:  Clear to auscultation bilaterally. No wheezes, rales, or rhonchi. No distress.  Heart:  S1, S2 present without murmurs appreciated.   Abdomen:  +BS, soft, non-tender and non-distended. Suspect hepatomegaly with firm liver margin extending to epigastric area. No splenomegaly appreciated. No guarding or rebound. No masses appreciated.  Rectal:  Deferred  Msk:  Symmetrical without gross deformities. Normal posture. Extremities:  With trace edema in the bilateral LE. Neurologic:  Alert and  oriented x4;  grossly normal neurologically. Skin:  Intact without significant lesions or rashes. Psych: Normal mood and affect.    Assessment:  61 year old male with history of alcohol abuse, adenomatous colon polyps overdue for surveillance colonoscopy, presenting today at the request of Dr. Legrand Rams for further evaluation of abnormal LFTs.  Also discussed anemia,  change in bowel habits, unintentional weight loss, and history of adenomatous colon polyps.  Elevated LFTs:  Mildly elevated AST and alk phos in July 2019, normalized thereafter.  Again with mildly elevated AST and alk phos in March 2022.  In February 2023, AST 111, ALT 58, alk phos 649, total bilirubin 1.3.  Platelets within normal limits.  No abdominal imaging on file.  History of alcohol abuse, currently drinking 3-4, 12 ounce beer daily, which I suspect is likely the driver of his abnormal liver enzymes.  Denies history of illicit drug use, no hepatitis exposures, tattoos, personal or family history of autoimmune conditions, family history of liver disease.  Clinically, no signs or symptoms of decompensated liver disease. Very minimal edema in LE that patient was asking about today. On exam, suspect hepatomegaly with increased firmness in right upper quadrant extending to epigastric area.  Will complete serologic evaluation to rule out hepatitis, autoimmune etiologies, hemochromatosis.  Will also get abdominal ultrasound.   Change in bowel habits: Previously with soft, formed bowel movements daily, now with frequent small-volume bowel movements daily, nocturnal stools in addition to  a normal formed stool daily. Overall, difficult to get clear history on this. Denies brbpr or melena. Had unintentional 13 lb weight loss in January, but weight has been stable since that time. Symptoms seem less consistent with infectious causes, more concerned about overflow diarrhea/possible underlying large polyp or colonic neoplasm as he is well overdue for colonoscopy, but due to difficult historian and reports of watery stools and nocturnal stools, will check stool studies for completeness as well as TSH.   History of adenomatous colon polyp: Overdue for surveillance. Last colonoscopy in January 2020 with 6 mm tubular adenoma in the descending colon removed with hot snare and SKIP, sessile hyperplastic polyp in the cecum.  Nonbleeding internal hemorrhoids.  Due to location, size, and type of removal required, recommended colonoscopy in 6 months.  Anemia: Chronic normocytic anemia with hemoglobin in the 12 range since 2019.  Most recent hemoglobin down to 10.7 on 07/29/2021 with macrocytic indices.  Denies overt GI bleeding or any other obvious blood loss.  Query whether he may have B12 or folate deficiency in the setting of alcohol abuse.  We will repeat CBC and check for deficiencies in B12, iron, and folate. Needs colonoscopy regardless due to history of adenomatous colon polyps and change in bowel habits discussed above.  May need EGD as well in the future.  Unintentional weight loss: 13 lb unintentional weight loss in January 2023.  Weight has been stable since the latter part of January.  Reports he had lack of appetite and weakness a few months ago, but this has resolved and he is now eating "around-the-clock" but unable to gain weight.  With reports of change in bowel habits and currently overdue for surveillance colonoscopy, unable to rule out colonic neoplasm.  No significant upper GI symptoms at this time. Planning for colonoscopy in the near future. Will also check TSH.    Plan:  CBC,  CMP, INR, hepatitis A antibody total, hepatitis B surface antibody, hepatitis B surface antigen, hepatitis B core antibody, hepatitis C antibody, ANA, AMA, ASMA, immunoglobulins, iron panel with ferritin, B12, folate, TSH.  Stool studies including C. difficile and GI Panel. Abdominal US complete.  Proceed with colonoscopy with propofol by Dr. Gala Romney in near future. The risks, benefits, and alternatives have been discussed with the patient in detail. The patient states understanding and desires to proceed. ASA 3 Counseled on alcohol  cessation.  2g sodium diet to help with minimal LE edema.  Further recommendations to follow labs, stool studies, and [place].  Follow-up in office after colonoscopy.    Aliene Altes, PA-C [facility] 10/03/2021

## 2021-10-03 ENCOUNTER — Telehealth: Payer: Self-pay

## 2021-10-03 ENCOUNTER — Other Ambulatory Visit: Payer: Self-pay

## 2021-10-03 ENCOUNTER — Ambulatory Visit (INDEPENDENT_AMBULATORY_CARE_PROVIDER_SITE_OTHER): Payer: BC Managed Care – PPO | Admitting: Gastroenterology

## 2021-10-03 ENCOUNTER — Encounter: Payer: Self-pay | Admitting: Gastroenterology

## 2021-10-03 VITALS — BP 122/68 | HR 98 | Temp 97.9°F | Ht 70.0 in | Wt 119.8 lb

## 2021-10-03 DIAGNOSIS — R7989 Other specified abnormal findings of blood chemistry: Secondary | ICD-10-CM | POA: Diagnosis not present

## 2021-10-03 DIAGNOSIS — R194 Change in bowel habit: Secondary | ICD-10-CM

## 2021-10-03 DIAGNOSIS — R634 Abnormal weight loss: Secondary | ICD-10-CM | POA: Diagnosis not present

## 2021-10-03 DIAGNOSIS — Z86010 Personal history of colonic polyps: Secondary | ICD-10-CM | POA: Insufficient documentation

## 2021-10-03 DIAGNOSIS — R197 Diarrhea, unspecified: Secondary | ICD-10-CM | POA: Insufficient documentation

## 2021-10-03 DIAGNOSIS — D649 Anemia, unspecified: Secondary | ICD-10-CM | POA: Insufficient documentation

## 2021-10-03 MED ORDER — PEG 3350-KCL-NA BICARB-NACL 420 G PO SOLR: 4000.0000 mL | ORAL | 0 refills | Status: DC

## 2021-10-03 NOTE — Patient Instructions (Addendum)
Please have labs and stool studies completed at [place].  We will arrange you to have an ultrasound of your abdomen in the near future at [facility].  We will arrange for you to have a colonoscopy with Dr. Gala Romney in the near future.  Please work towards abstinence from alcohol.  To help with slight swelling in your lower legs: Limit your sodium intake.  Would recommend no more than 2000 mg of sodium per day.  This includes everything you eat and drink. Keep a dietary log over the next week recording sodium you are consuming in each meal.  This will help you determine where you can improving her diet.  We will have further recommendations for you following your labs and stool studies.  We will plan to follow-up with you in the office after your colonoscopy.  Aliene Altes, PA-C [facility]astroenter[facility]

## 2021-10-03 NOTE — Telephone Encounter (Signed)
No PA needed for TCS per [employer].

## 2021-10-06 DIAGNOSIS — Z1159 Encounter for screening for other viral diseases: Secondary | ICD-10-CM | POA: Diagnosis not present

## 2021-10-06 DIAGNOSIS — Z7289 Other problems related to lifestyle: Secondary | ICD-10-CM | POA: Diagnosis not present

## 2021-10-06 DIAGNOSIS — Z79899 Other long term (current) drug therapy: Secondary | ICD-10-CM | POA: Diagnosis not present

## 2021-10-06 DIAGNOSIS — D529 Folate deficiency anemia, unspecified: Secondary | ICD-10-CM | POA: Diagnosis not present

## 2021-10-06 DIAGNOSIS — B182 Chronic viral hepatitis C: Secondary | ICD-10-CM | POA: Diagnosis not present

## 2021-10-13 ENCOUNTER — Telehealth: Payer: Self-pay | Admitting: Orthopaedic Surgery

## 2021-10-13 NOTE — Telephone Encounter (Signed)
Patient came in the office requesting refill on his pain medicine.  Advised him it will be Tuesday after lunch time before it will be refilled   HYDROcodone-acetaminophen (NORCO/VICODIN) 5-325 MG tablet  Pharmacy: [facility] in [place]

## 2021-10-14 DIAGNOSIS — R7989 Other specified abnormal findings of blood chemistry: Secondary | ICD-10-CM | POA: Diagnosis not present

## 2021-10-14 DIAGNOSIS — R197 Diarrhea, unspecified: Secondary | ICD-10-CM | POA: Diagnosis not present

## 2021-10-14 DIAGNOSIS — R194 Change in bowel habit: Secondary | ICD-10-CM | POA: Diagnosis not present

## 2021-10-14 DIAGNOSIS — B182 Chronic viral hepatitis C: Secondary | ICD-10-CM | POA: Diagnosis not present

## 2021-10-14 LAB — HEPATITIS B SURFACE ANTIGEN: Hepatitis B Surface Ag: NONREACTIVE

## 2021-10-14 LAB — COMPLETE METABOLIC PANEL WITH GFR
AG Ratio: 0.7 (calc) — ABNORMAL LOW (ref 1.0–2.5)
ALT: 29 U/L (ref 9–46)
AST: 71 U/L — ABNORMAL HIGH (ref 10–35)
Albumin: 2.8 g/dL — ABNORMAL LOW (ref 3.6–5.1)
Alkaline phosphatase (APISO): 237 U/L — ABNORMAL HIGH (ref 35–144)
BUN/Creatinine Ratio: 15 (calc) (ref 6–22)
BUN: 8 mg/dL (ref 7–25)
CO2: 26 mmol/L (ref 20–32)
Calcium: 7.8 mg/dL — ABNORMAL LOW (ref 8.6–10.3)
Chloride: 101 mmol/L (ref 98–110)
Creat: 0.52 mg/dL — ABNORMAL LOW (ref 0.70–1.35)
Globulin: 4.3 g/dL (calc) — ABNORMAL HIGH (ref 1.9–3.7)
Glucose, Bld: 112 mg/dL (ref 65–139)
Potassium: 4 mmol/L (ref 3.5–5.3)
Sodium: 137 mmol/L (ref 135–146)
Total Bilirubin: 0.9 mg/dL (ref 0.2–1.2)
Total Protein: 7.1 g/dL (ref 6.1–8.1)
eGFR: 115 mL/min/{1.73_m2} (ref >=60)

## 2021-10-14 LAB — CBC WITH DIFFERENTIAL/PLATELET
Absolute Monocytes: 560 cells/uL (ref 200–950)
Basophils Absolute: 91 cells/uL (ref 0–200)
Basophils Relative: 1.3 %
Eosinophils Absolute: 28 cells/uL (ref 15–500)
Eosinophils Relative: 0.4 %
HCT: 30.3 % — ABNORMAL LOW (ref 38.5–50.0)
Hemoglobin: 10.2 g/dL — ABNORMAL LOW (ref 13.2–17.1)
Lymphs Abs: 2261 cells/uL (ref 850–3900)
MCH: 31 pg (ref 27.0–33.0)
MCHC: 33.7 g/dL (ref 32.0–36.0)
MCV: 92.1 fL (ref 80.0–100.0)
MPV: 11.1 fL (ref 7.5–12.5)
Monocytes Relative: 8 %
Neutro Abs: 4060 cells/uL (ref 1500–7800)
Neutrophils Relative %: 58 %
Platelets: 157 10*3/uL (ref 140–400)
RBC: 3.29 10*6/uL — ABNORMAL LOW (ref 4.20–5.80)
RDW: 14.4 % (ref 11.0–15.0)
Total Lymphocyte: 32.3 %
WBC: 7 10*3/uL (ref 3.8–10.8)

## 2021-10-14 LAB — IGG, IGA, IGM
IgG (Immunoglobin G), Serum: 2508 mg/dL — ABNORMAL HIGH (ref 600–1640)
IgM, Serum: 143 mg/dL (ref 50–300)
Immunoglobulin A: 748 mg/dL — ABNORMAL HIGH (ref 47–310)

## 2021-10-14 LAB — HEPATITIS A ANTIBODY, TOTAL: Hepatitis A AB,Total: REACTIVE — AB

## 2021-10-14 LAB — ANTI-SMOOTH MUSCLE ANTIBODY, IGG: Actin (Smooth Muscle) Antibody (IGG): 20 U — ABNORMAL HIGH (ref <20)

## 2021-10-14 LAB — IRON,TIBC AND FERRITIN PANEL
%SAT: 53 % (calc) — ABNORMAL HIGH (ref 20–48)
Ferritin: 990 ng/mL — ABNORMAL HIGH (ref 24–380)
Iron: 89 ug/dL (ref 50–180)
TIBC: 167 mcg/dL (calc) — ABNORMAL LOW (ref 250–425)

## 2021-10-14 LAB — PROTIME-INR
INR: 1.2 — ABNORMAL HIGH
Prothrombin Time: 12.7 s — ABNORMAL HIGH (ref 9.0–11.5)

## 2021-10-14 LAB — B12 AND FOLATE PANEL
Folate: 8.3 ng/mL
Vitamin B-12: 471 pg/mL (ref 200–1100)

## 2021-10-14 LAB — HEPATITIS C ANTIBODY
Hepatitis C Ab: NONREACTIVE
SIGNAL TO CUT-OFF: 0.24 (ref <1.00)

## 2021-10-14 LAB — ANA: Anti Nuclear Antibody (ANA): NEGATIVE

## 2021-10-14 LAB — TSH: TSH: 1.57 mIU/L (ref 0.40–4.50)

## 2021-10-14 LAB — HEPATITIS B CORE ANTIBODY, TOTAL: Hep B Core Total Ab: NONREACTIVE

## 2021-10-14 LAB — HEPATITIS B SURFACE ANTIBODY,QUALITATIVE: Hep B S Ab: REACTIVE — AB

## 2021-10-14 LAB — MITOCHONDRIAL ANTIBODIES: Mitochondrial M2 Ab, IgG: <=20 U (ref <=20.0)

## 2021-10-17 ENCOUNTER — Ambulatory Visit (HOSPITAL_COMMUNITY)
Admission: RE | Admit: 2021-10-17 | Discharge: 2021-10-17 | Disposition: A | Discharge status: Home / Self Care | Payer: BC Managed Care – PPO | Source: Ambulatory Visit | Attending: Gastroenterology | Admitting: Gastroenterology

## 2021-10-17 DIAGNOSIS — R7989 Other specified abnormal findings of blood chemistry: Secondary | ICD-10-CM | POA: Insufficient documentation

## 2021-10-17 DIAGNOSIS — K7689 Other specified diseases of liver: Secondary | ICD-10-CM | POA: Diagnosis not present

## 2021-10-17 LAB — GIARDIA ANTIGEN
MICRO NUMBER:: 13477039
RESULT:: NOT DETECTED
SPECIMEN QUALITY:: ADEQUATE

## 2021-10-17 LAB — C. DIFFICILE GDH AND TOXIN A/B
GDH ANTIGEN: NOT DETECTED
MICRO NUMBER:: 13477792
SPECIMEN QUALITY:: ADEQUATE
TOXIN A AND B: NOT DETECTED

## 2021-10-17 LAB — CRYPTOSPORIDIUM ANTIGEN, EIA
Specimen Quality:: ADEQUATE
micro Number:: 13477038

## 2021-10-17 IMAGING — US US ABDOMEN COMPLETE
1 series · 14 of 25 positions shown · non-contrast
Comparison: None Available.

CLINICAL DATA: Elevated LFTs

EXAM:
ABDOMEN ULTRASOUND COMPLETE

[Series 1: us abdomen complete · 14 of 210 slices shown]
[im 1/210]
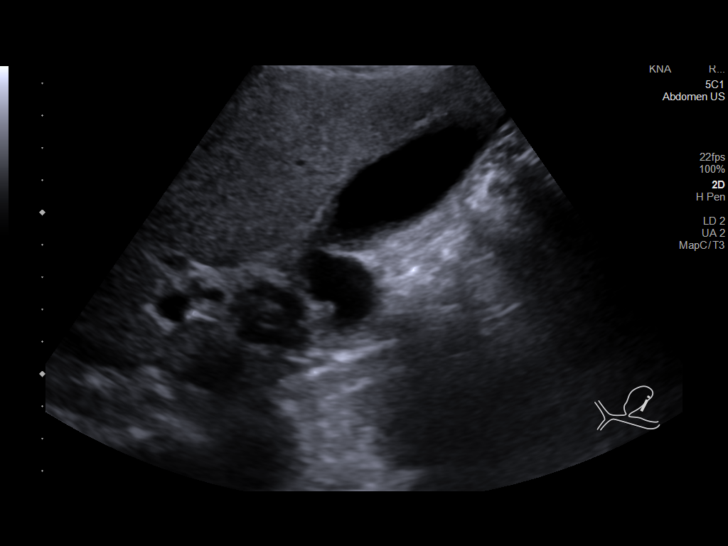
[im 18/210]
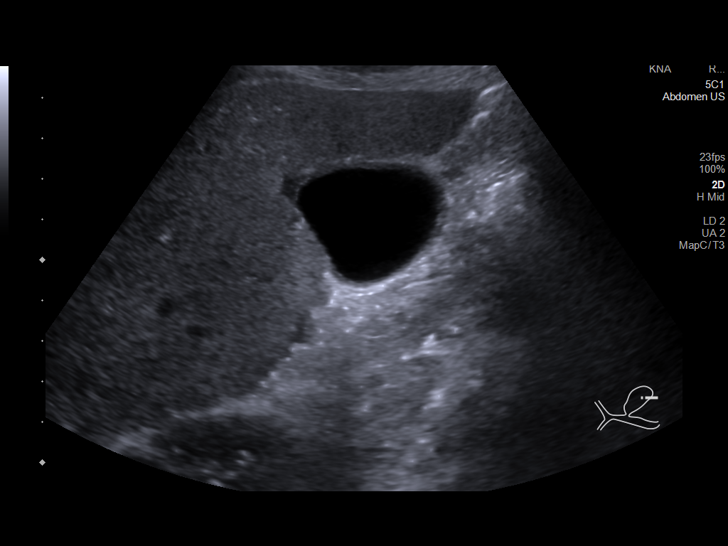
[im 35/210]
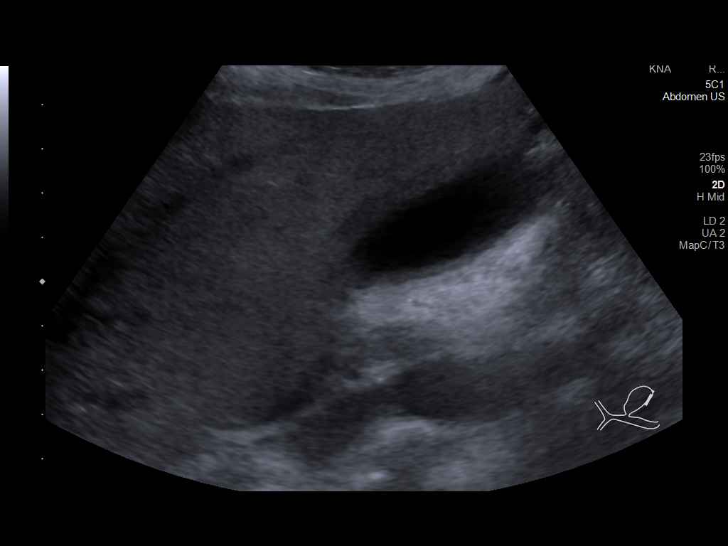
[im 53/210]
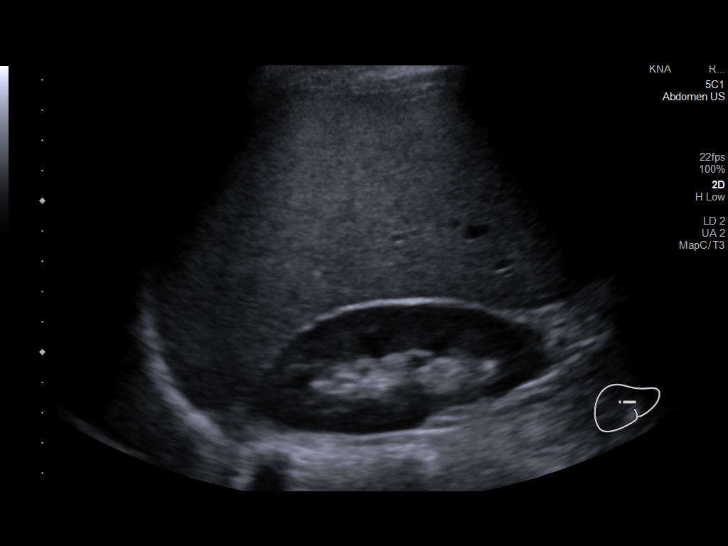
[im 70/210]
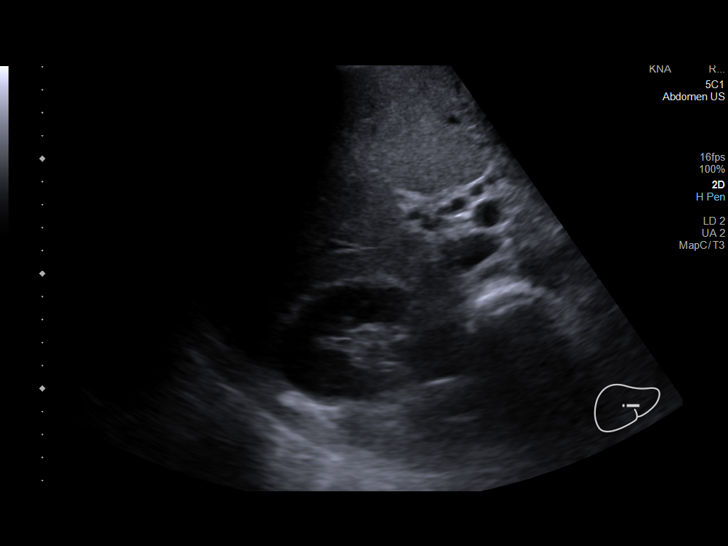
[im 79/210]
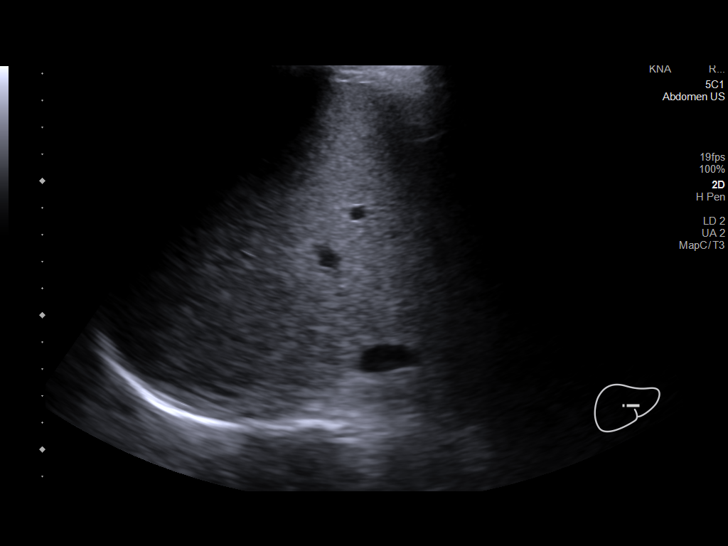
[im 96/210]
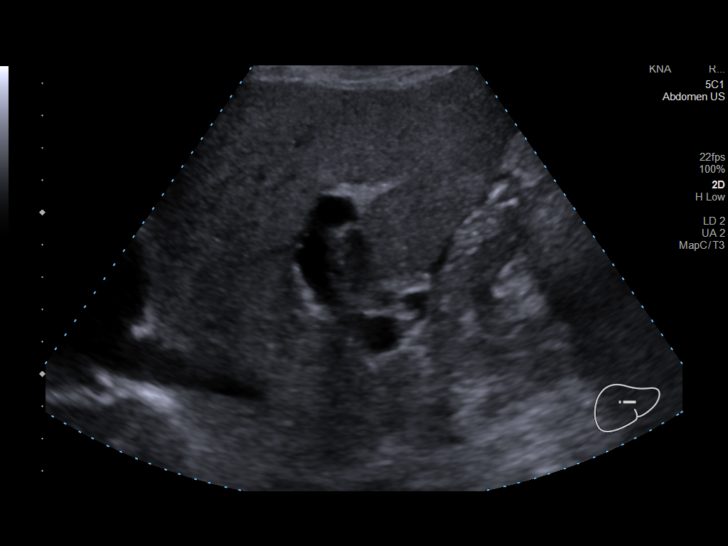
[im 114/210]
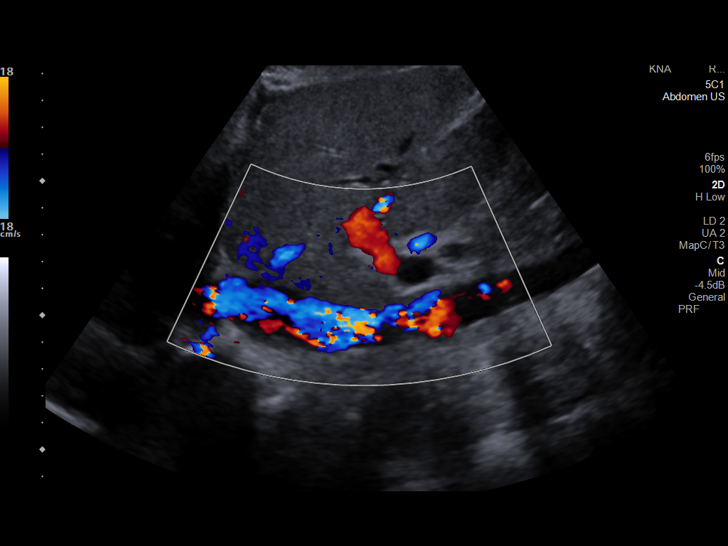
[im 131/210]
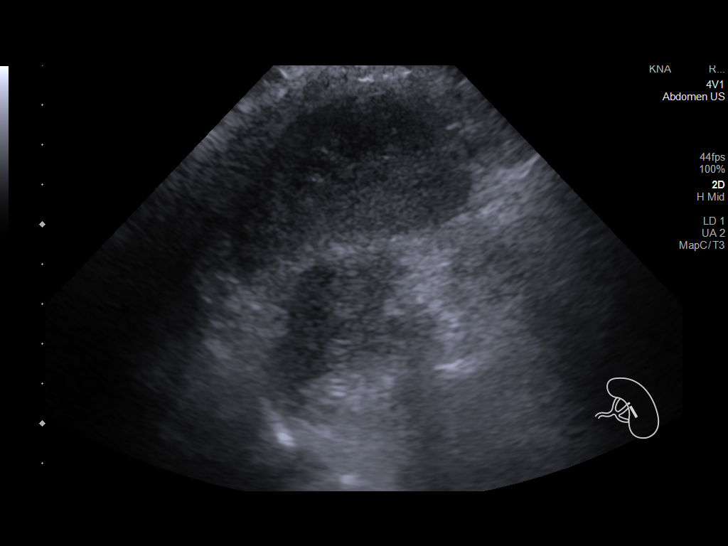
[im 140/210]
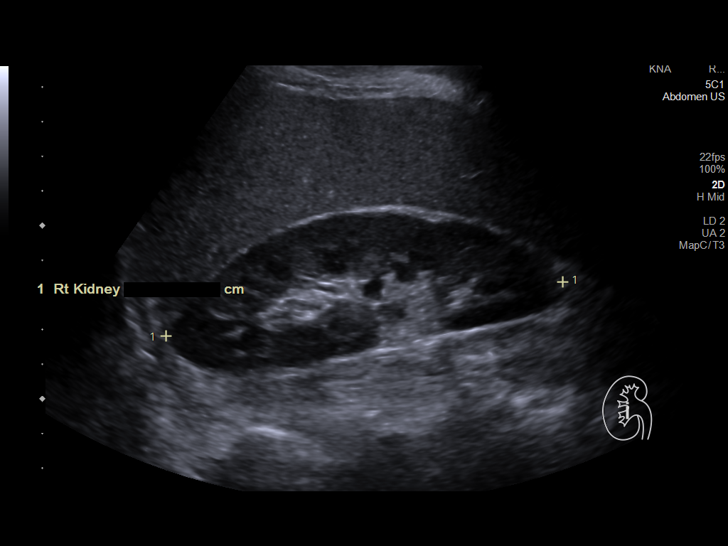
[im 157/210]
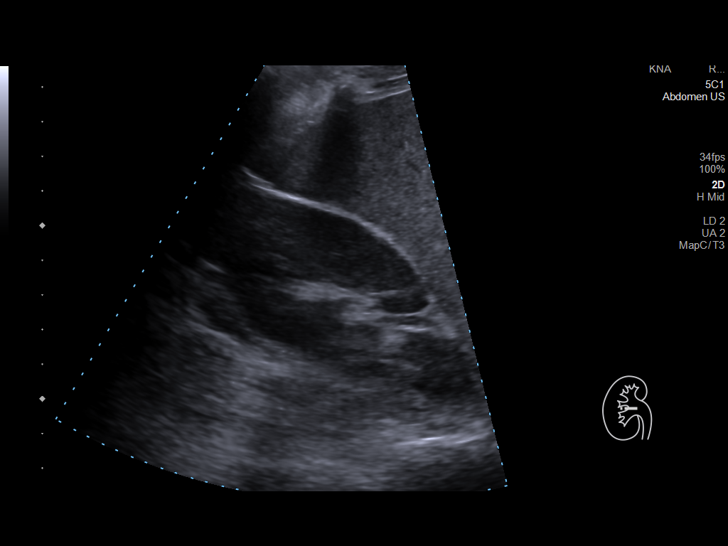
[im 175/210]
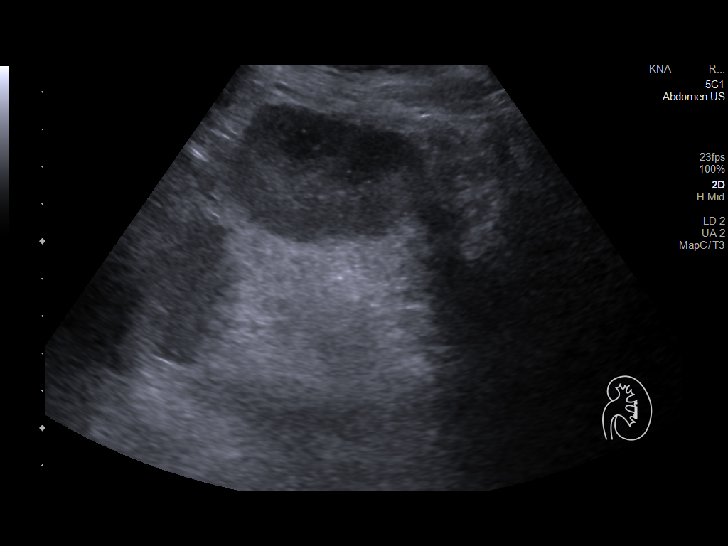
[im 192/210]
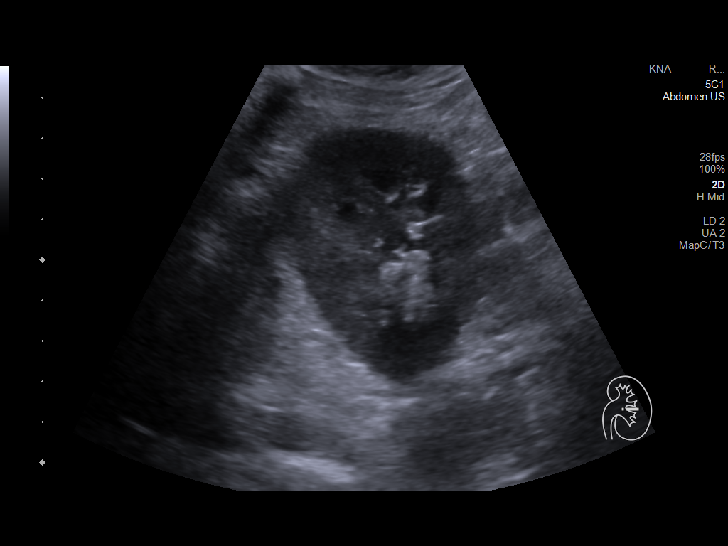
[im 210/210]
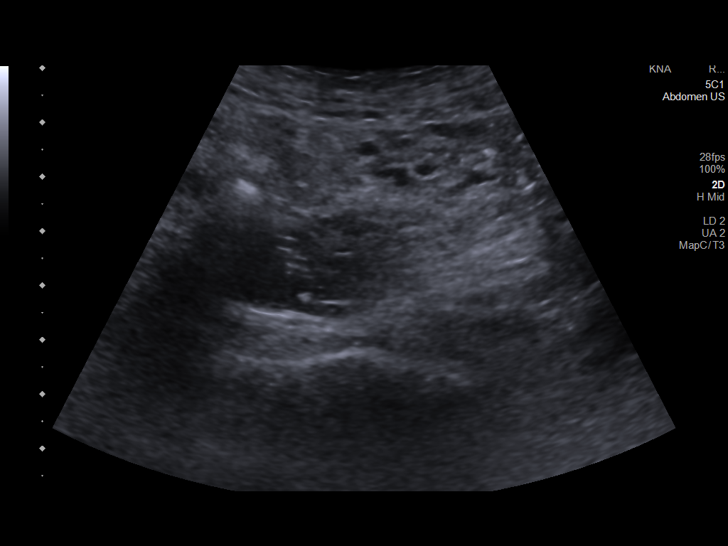

[14 of 25 positions shown; findings below may reference images not displayed]

FINDINGS: Study is limited due to body habitus and bowel gas.

Gallbladder: No gallstones or wall thickening visualized. No
sonographic Murphy sign noted by sonographer.

Common bile duct: Diameter: 8 mm

Liver: Increased echogenicity of the parenchyma with no focal mass
identified. Portal vein is patent on color Doppler imaging with
normal direction of blood flow towards the liver.

IVC: No abnormality visualized.

Pancreas: Visualized portion unremarkable.

Spleen: Limited visualization.  Appears small.

Right Kidney: Length: 11.6 cm. Echogenicity within normal limits. No
mass or hydronephrosis visualized.

Left Kidney: Length: 12.2 cm. Echogenicity within normal limits. No
mass or hydronephrosis visualized.

Abdominal aorta: No aneurysm visualized.

Other findings: Trace perihepatic ascites.
IMPRESSION: 1. Increased echogenicity of the liver parenchyma suggesting hepatic
steatosis and/or other hepatocellular disease.
2. Trace perihepatic ascites.
3. Mildly dilated common bile duct, correlate clinically and
consider MRCP if indicated.

## 2021-10-17 MED ORDER — HYDROCODONE-ACETAMINOPHEN 5-325 MG PO TABS: ORAL_TABLET | ORAL | 0 refills | Status: DC

## 2021-10-18 ENCOUNTER — Other Ambulatory Visit: Payer: Self-pay | Admitting: *Deleted

## 2021-10-18 DIAGNOSIS — K838 Other specified diseases of biliary tract: Secondary | ICD-10-CM

## 2021-10-18 DIAGNOSIS — R748 Abnormal levels of other serum enzymes: Secondary | ICD-10-CM

## 2021-10-19 NOTE — Progress Notes (Signed)
Noted  

## 2021-10-27 ENCOUNTER — Telehealth: Payer: Self-pay | Admitting: *Deleted

## 2021-10-27 NOTE — Telephone Encounter (Signed)
Spoke to pt, he informed me that he did not get any instructions in the mail as to what to do for his procedure. I informed him that instructions were sent out and I would send another set of instructions. He then stated he may have to cancel, because he cant' miss 2 days of work. I gave him the number to central scheduling.

## 2021-10-28 NOTE — Telephone Encounter (Signed)
Pt returned call. He wants to r/s his procedure for august. Aware will call once we receive that future schedule

## 2021-10-28 NOTE — Telephone Encounter (Signed)
LMOVM to call back to discuss with pt

## 2021-10-28 NOTE — Telephone Encounter (Signed)
Patient doesn't need to call Central Scheduling as he is scheduled for a colonoscopy, not imaging studies.   I would encourage him to have his colonoscopy competed to further evaluate his symptoms of change in bowel habits, anemia, weight loss, and his history of colon polyps.   Mindy: Can you reach out to patient to ensure he has his colon prep instructions. If not, would he be able to come by the office to pick up a set?  If he is asking to cancel his procedure due to inability to miss 2 days of work, can we get him rescheduled on a Monday or Friday? If we have to consider scheduling with Dr. Abbey Chatters, that would be ok.

## 2021-10-31 ENCOUNTER — Other Ambulatory Visit: Payer: Self-pay | Admitting: *Deleted

## 2021-10-31 DIAGNOSIS — R7989 Other specified abnormal findings of blood chemistry: Secondary | ICD-10-CM

## 2021-10-31 NOTE — Telephone Encounter (Signed)
Noted  

## 2021-11-01 ENCOUNTER — Other Ambulatory Visit: Payer: Self-pay | Admitting: Gastroenterology

## 2021-11-01 ENCOUNTER — Ambulatory Visit (HOSPITAL_COMMUNITY)
Admission: RE | Admit: 2021-11-01 | Discharge: 2021-11-01 | Disposition: A | Discharge status: Home / Self Care | Payer: BC Managed Care – PPO | Source: Ambulatory Visit | Attending: Gastroenterology | Admitting: Gastroenterology

## 2021-11-01 ENCOUNTER — Encounter (HOSPITAL_COMMUNITY): Payer: BC Managed Care – PPO

## 2021-11-01 DIAGNOSIS — R748 Abnormal levels of other serum enzymes: Secondary | ICD-10-CM

## 2021-11-01 DIAGNOSIS — R197 Diarrhea, unspecified: Secondary | ICD-10-CM | POA: Diagnosis not present

## 2021-11-01 DIAGNOSIS — K760 Fatty (change of) liver, not elsewhere classified: Secondary | ICD-10-CM | POA: Diagnosis not present

## 2021-11-01 DIAGNOSIS — K838 Other specified diseases of biliary tract: Secondary | ICD-10-CM | POA: Diagnosis not present

## 2021-11-01 DIAGNOSIS — R160 Hepatomegaly, not elsewhere classified: Secondary | ICD-10-CM | POA: Diagnosis not present

## 2021-11-01 DIAGNOSIS — R634 Abnormal weight loss: Secondary | ICD-10-CM | POA: Diagnosis not present

## 2021-11-01 DIAGNOSIS — R194 Change in bowel habit: Secondary | ICD-10-CM | POA: Diagnosis not present

## 2021-11-01 IMAGING — MR MR ABDOMEN WO/W CM MRCP
19 of 21 series · 44 of 48 positions shown · IV contrast (gadavist)
Comparison: Abdominal ultrasound [DATE]

CLINICAL DATA: Elevated liver enzymes, dilated CBD

EXAM:
MRI ABDOMEN WITHOUT AND WITH CONTRAST (INCLUDING MRCP)
TECHNIQUE: Multiplanar multisequence MR imaging of the abdomen was performed
both before and after the administration of intravenous contrast.
Heavily T2-weighted images of the biliary and pancreatic ducts were
obtained, and three-dimensional MRCP images were rendered by post
processing.
CONTRAST:  7mL GADAVIST GADOBUTROL 1 MMOL/ML IV SOLN

[Series 3: ax haste · axial · 6.0mm · 1.19mm/px · z∈[-198,+46]mm · 2 of 35 slices shown]
[im 1/35]
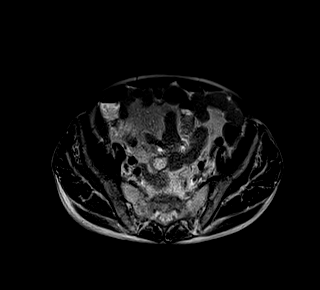
[im 35/35]
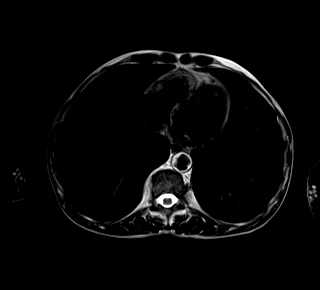

[Series 4: cor haste · coronal · 6.0mm · 1.25mm/px · 1 of 33 slices shown]
[im 1/33]
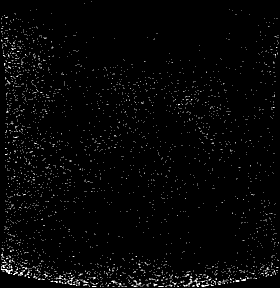

[Series 6: T2 fat-sat · axial · 6.0mm · 1.19mm/px · 1 of 35 slices shown]
[im 1/35]
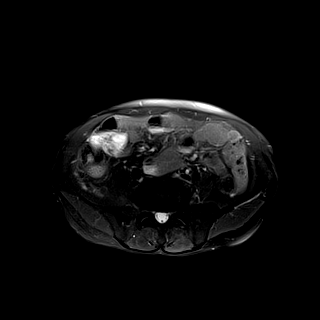

[Series 10: DWI · axial · 6.0mm · 1.34mm/px · 1 of 33 slices shown (1 of 4)]
[im 1/33]
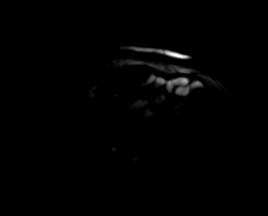

[Series 10: DWI · axial · 6.0mm · 1.34mm/px · 1 of 33 slices shown (2 of 4)]
[im 1/33]
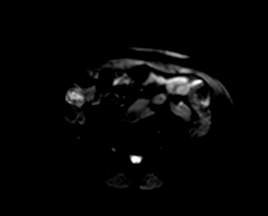

[Series 10: DWI · axial · 6.0mm · 1.34mm/px · 1 of 33 slices shown (3 of 4)]
[im 1/33]
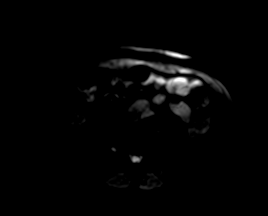

[Series 11: DWI · axial · 6.0mm · 1.34mm/px · 1 of 33 slices shown (4 of 4)]
[im 1/33]
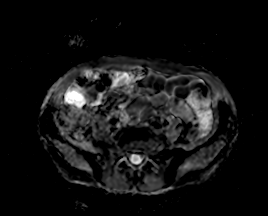

[Series 12: ax in and · axial · 3.0mm · 1.09mm/px · z∈[-165,+96]mm · 3 of 88 slices shown (1 of 2)]
[im 1/88]
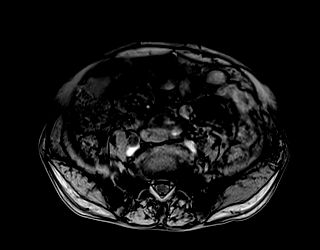
[im 44/88]
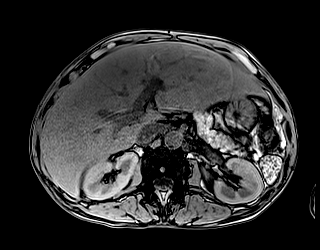
[im 88/88]
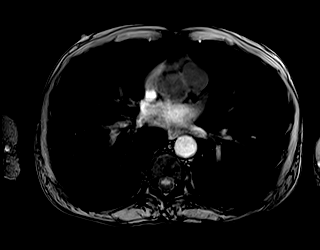

[Series 13: ax in and · axial · 3.0mm · 1.09mm/px · z∈[-165,+96]mm · 3 of 88 slices shown (2 of 2)]
[im 1/88]
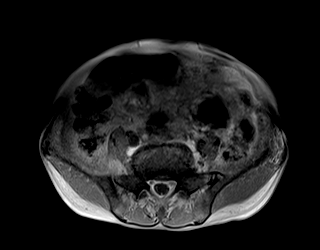
[im 44/88]
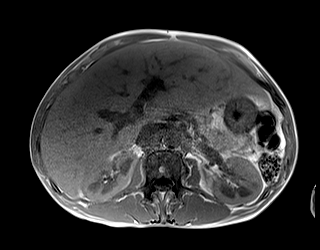
[im 88/88]
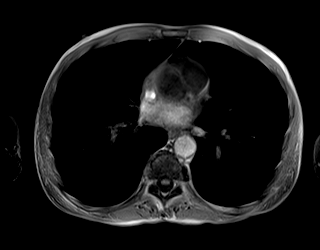

[Series 14: T1 dynamic · axial · non-contrast · 3.0mm · 1.12mm/px · z∈[-165,+96]mm · 3 of 88 slices shown (1 of 4)]
[im 1/88]
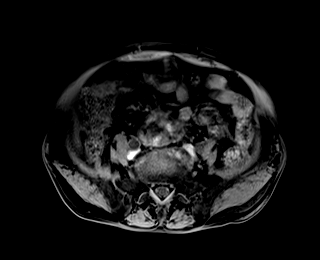
[im 44/88]
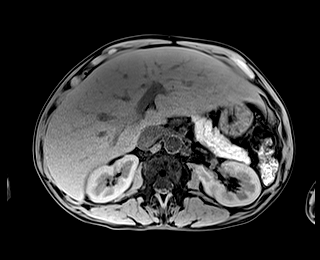
[im 88/88]
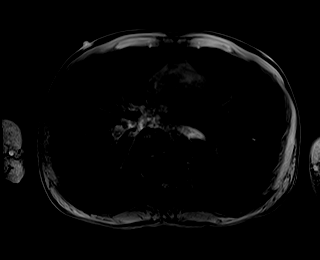

[Series 16: T1 dynamic post-contrast · axial · 3.0mm · 1.12mm/px · z∈[-165,+96]mm · 3 of 88 slices shown (1 of 6)]
[im 1/88]
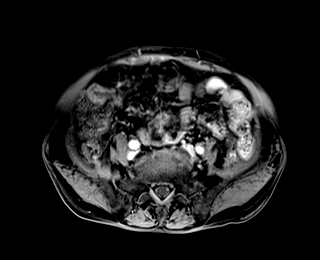
[im 44/88]
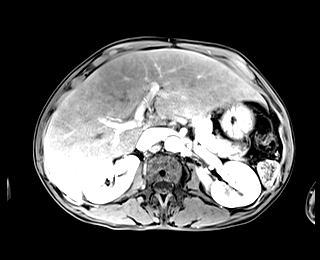
[im 88/88]
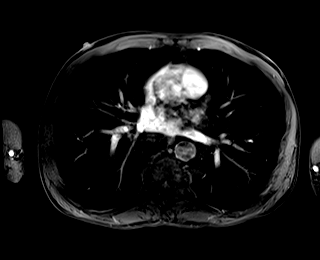

[Series 17: T1 dynamic · axial · 3.0mm · 1.12mm/px · z∈[-165,+96]mm · 3 of 88 slices shown (2 of 4)]
[im 1/88]
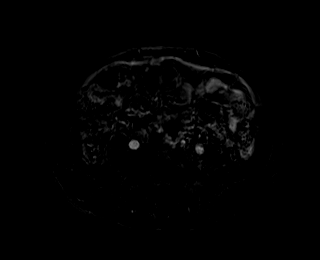
[im 44/88]
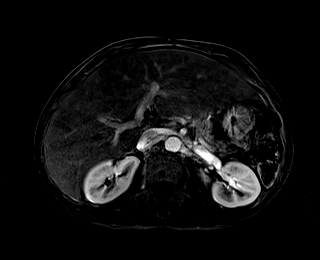
[im 88/88]
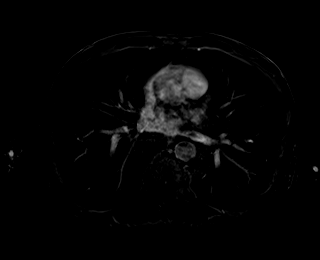

[Series 18: T1 dynamic post-contrast · axial · 3.0mm · 1.12mm/px · z∈[-165,+96]mm · 3 of 88 slices shown (2 of 6)]
[im 1/88]
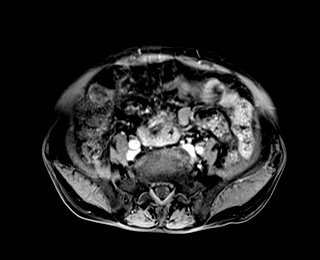
[im 44/88]
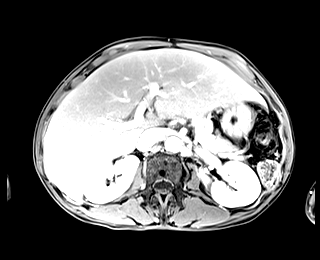
[im 88/88]
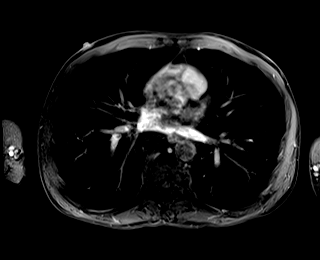

[Series 19: T1 dynamic · axial · 3.0mm · 1.12mm/px · z∈[-165,+96]mm · 3 of 88 slices shown (3 of 4)]
[im 1/88]
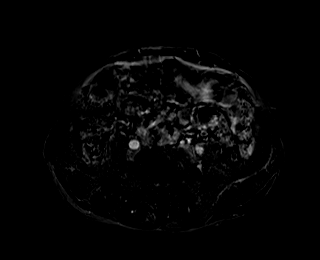
[im 44/88]
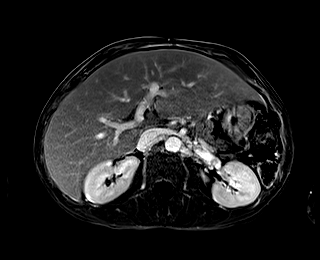
[im 88/88]
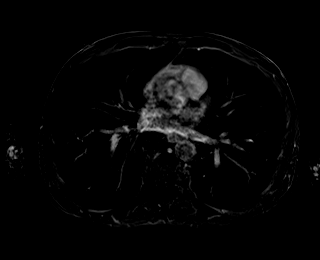

[Series 20: T1 dynamic post-contrast · axial · 3.0mm · 1.12mm/px · z∈[-165,+96]mm · 3 of 88 slices shown (3 of 6)]
[im 1/88]
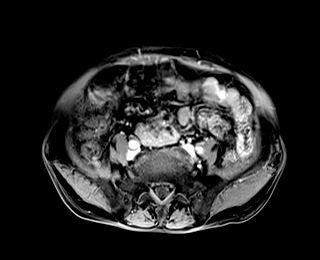
[im 44/88]
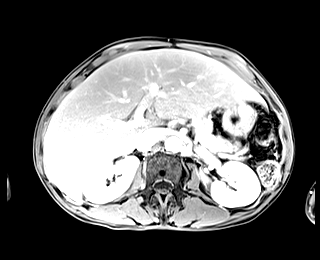
[im 88/88]
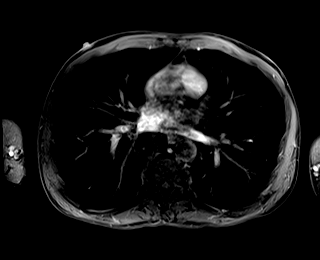

[Series 21: T1 dynamic · axial · 3.0mm · 1.12mm/px · z∈[-165,+96]mm · 3 of 88 slices shown (4 of 4)]
[im 1/88]
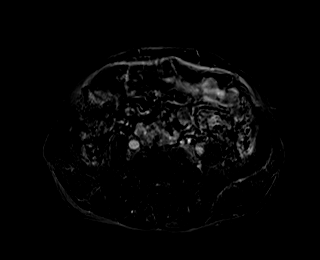
[im 44/88]
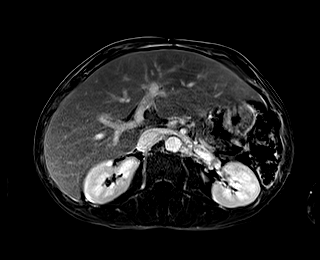
[im 88/88]
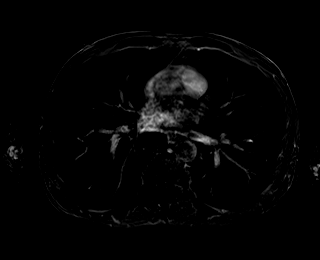

[Series 22: T1 dynamic post-contrast · coronal · 3.0mm · 1.19mm/px · 3 of 80 slices shown (4 of 6)]
[im 1/80]
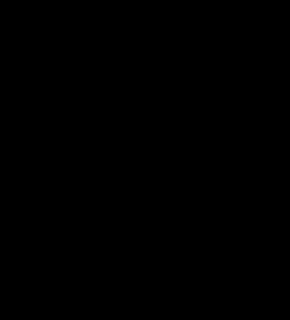
[im 40/80]
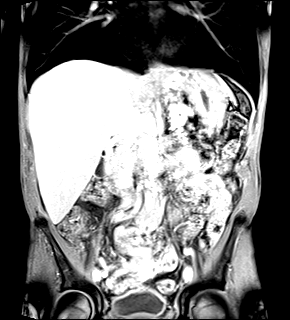
[im 80/80]
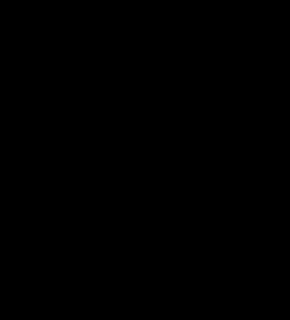

[Series 23: T1 dynamic post-contrast · axial · 3.0mm · 1.12mm/px · z∈[-165,+96]mm · 3 of 88 slices shown (5 of 6)]
[im 1/88]
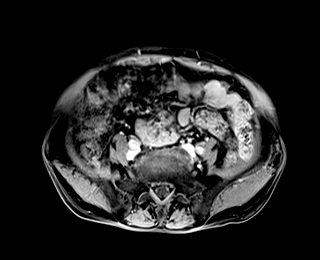
[im 44/88]
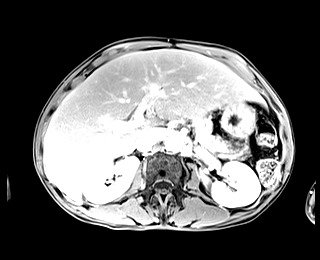
[im 88/88]
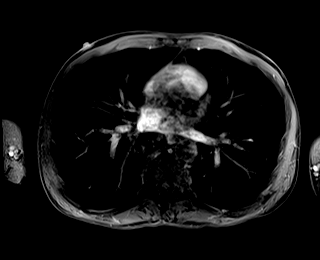

[Series 24: T1 dynamic post-contrast · axial · 3.0mm · 1.12mm/px · z∈[-165,+96]mm · 3 of 88 slices shown (6 of 6)]
[im 1/88]
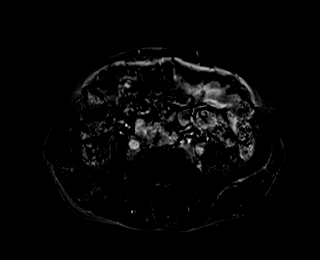
[im 44/88]
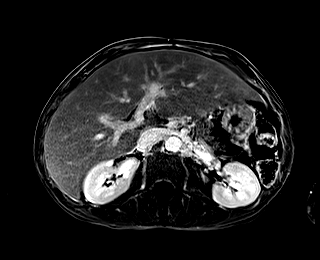
[im 88/88]
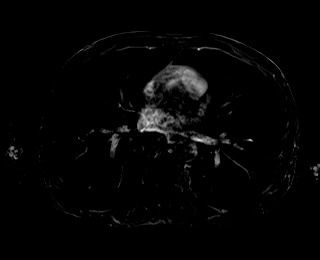

[44 of 48 positions shown; findings below may reference images not displayed]

FINDINGS: Lower chest: No acute findings.

Hepatobiliary: Liver is enlarged measuring 19.7 cm in length. There
is evidence of mild hepatic steatosis. Tiny subcentimeter hepatic
cyst in the anterior right lobe. Gallbladder appears normal. No
biliary ductal dilatation, the common bile duct measures up to 6 mm
in diameter. No choledocholithiasis visualized.

Pancreas: No mass, inflammatory changes, or other parenchymal
abnormality identified.

Spleen:  Within normal limits in size and appearance.

Adrenals/Urinary Tract: No masses identified. No evidence of
hydronephrosis.

Stomach/Bowel: Visualized portions within the abdomen are
unremarkable.

Vascular/Lymphatic: No pathologically enlarged lymph nodes
identified. No abdominal aortic aneurysm demonstrated.

Other:  Trace perihepatic ascites.

Musculoskeletal: No suspicious bone lesions identified.
IMPRESSION: 1. Hepatomegaly with evidence of mild hepatic steatosis.
2. No biliary ductal dilatation identified.
3. Trace perihepatic ascites.

## 2021-11-01 MED ORDER — GADOBUTROL 1 MMOL/ML IV SOLN
7.0000 mL | Freq: Once | INTRAVENOUS | Status: AC | PRN
  Administered 2021-11-01: 7 mL via INTRAVENOUS

## 2021-11-02 ENCOUNTER — Other Ambulatory Visit: Payer: Self-pay | Admitting: *Deleted

## 2021-11-02 DIAGNOSIS — R7989 Other specified abnormal findings of blood chemistry: Secondary | ICD-10-CM

## 2021-11-02 LAB — TSH: TSH: 1.71 mIU/L (ref 0.40–4.50)

## 2021-11-02 NOTE — Addendum Note (Signed)
Addended by: Inda Castle on: 11/02/2021 10:05 AM   Modules accepted: Orders

## 2021-11-03 ENCOUNTER — Ambulatory Visit (HOSPITAL_COMMUNITY): Admit: 2021-11-03 | Payer: BC Managed Care – PPO | Admitting: Internal Medicine

## 2021-11-03 ENCOUNTER — Encounter (HOSPITAL_COMMUNITY): Payer: Self-pay

## 2021-11-03 SURGERY — COLONOSCOPY WITH PROPOFOL
Anesthesia: Monitor Anesthesia Care

## 2021-11-04 ENCOUNTER — Other Ambulatory Visit: Payer: Self-pay | Admitting: *Deleted

## 2021-11-04 DIAGNOSIS — R7989 Other specified abnormal findings of blood chemistry: Secondary | ICD-10-CM

## 2021-11-07 ENCOUNTER — Other Ambulatory Visit: Payer: Self-pay | Admitting: *Deleted

## 2021-11-07 DIAGNOSIS — R7989 Other specified abnormal findings of blood chemistry: Secondary | ICD-10-CM

## 2021-11-07 DIAGNOSIS — R748 Abnormal levels of other serum enzymes: Secondary | ICD-10-CM

## 2021-11-07 NOTE — Progress Notes (Signed)
Mailed lab requisitions.  

## 2021-11-10 NOTE — Telephone Encounter (Signed)
LMOVM for pt 

## 2021-11-21 ENCOUNTER — Encounter: Payer: Self-pay | Admitting: *Deleted

## 2021-12-02 DIAGNOSIS — R7989 Other specified abnormal findings of blood chemistry: Secondary | ICD-10-CM | POA: Diagnosis not present

## 2021-12-02 DIAGNOSIS — R748 Abnormal levels of other serum enzymes: Secondary | ICD-10-CM | POA: Diagnosis not present

## 2021-12-04 ENCOUNTER — Encounter (HOSPITAL_COMMUNITY): Payer: Self-pay

## 2021-12-04 ENCOUNTER — Emergency Department (HOSPITAL_COMMUNITY): Payer: BC Managed Care – PPO

## 2021-12-04 ENCOUNTER — Other Ambulatory Visit: Payer: Self-pay

## 2021-12-04 ENCOUNTER — Inpatient Hospital Stay (HOSPITAL_COMMUNITY)
Admission: EM | Admit: 2021-12-04 | Discharge: 2021-12-12 | DRG: 871 | Disposition: A | Discharge status: Home / Self Care | Payer: BC Managed Care – PPO | Attending: Internal Medicine | Admitting: Internal Medicine

## 2021-12-04 DIAGNOSIS — E876 Hypokalemia: Secondary | ICD-10-CM | POA: Diagnosis present

## 2021-12-04 DIAGNOSIS — Z681 Body mass index (BMI) 19 or less, adult: Secondary | ICD-10-CM

## 2021-12-04 DIAGNOSIS — F17210 Nicotine dependence, cigarettes, uncomplicated: Secondary | ICD-10-CM | POA: Diagnosis present

## 2021-12-04 DIAGNOSIS — Z20822 Contact with and (suspected) exposure to covid-19: Secondary | ICD-10-CM | POA: Diagnosis present

## 2021-12-04 DIAGNOSIS — Z8249 Family history of ischemic heart disease and other diseases of the circulatory system: Secondary | ICD-10-CM | POA: Diagnosis not present

## 2021-12-04 DIAGNOSIS — J90 Pleural effusion, not elsewhere classified: Secondary | ICD-10-CM | POA: Diagnosis not present

## 2021-12-04 DIAGNOSIS — F1021 Alcohol dependence, in remission: Secondary | ICD-10-CM | POA: Diagnosis present

## 2021-12-04 DIAGNOSIS — I4891 Unspecified atrial fibrillation: Secondary | ICD-10-CM | POA: Diagnosis present

## 2021-12-04 DIAGNOSIS — Z720 Tobacco use: Secondary | ICD-10-CM

## 2021-12-04 DIAGNOSIS — A419 Sepsis, unspecified organism: Secondary | ICD-10-CM | POA: Diagnosis not present

## 2021-12-04 DIAGNOSIS — R7401 Elevation of levels of liver transaminase levels: Secondary | ICD-10-CM | POA: Diagnosis present

## 2021-12-04 DIAGNOSIS — Z8616 Personal history of COVID-19: Secondary | ICD-10-CM

## 2021-12-04 DIAGNOSIS — J918 Pleural effusion in other conditions classified elsewhere: Secondary | ICD-10-CM | POA: Diagnosis not present

## 2021-12-04 DIAGNOSIS — J9601 Acute respiratory failure with hypoxia: Secondary | ICD-10-CM | POA: Diagnosis not present

## 2021-12-04 DIAGNOSIS — J13 Pneumonia due to Streptococcus pneumoniae: Secondary | ICD-10-CM | POA: Diagnosis not present

## 2021-12-04 DIAGNOSIS — D696 Thrombocytopenia, unspecified: Secondary | ICD-10-CM | POA: Diagnosis present

## 2021-12-04 DIAGNOSIS — J189 Pneumonia, unspecified organism: Secondary | ICD-10-CM | POA: Diagnosis not present

## 2021-12-04 DIAGNOSIS — D72825 Bandemia: Secondary | ICD-10-CM | POA: Diagnosis not present

## 2021-12-04 DIAGNOSIS — R918 Other nonspecific abnormal finding of lung field: Secondary | ICD-10-CM | POA: Diagnosis not present

## 2021-12-04 DIAGNOSIS — A403 Sepsis due to Streptococcus pneumoniae: Secondary | ICD-10-CM | POA: Diagnosis not present

## 2021-12-04 DIAGNOSIS — R636 Underweight: Secondary | ICD-10-CM | POA: Diagnosis present

## 2021-12-04 DIAGNOSIS — R000 Tachycardia, unspecified: Secondary | ICD-10-CM | POA: Diagnosis not present

## 2021-12-04 DIAGNOSIS — E871 Hypo-osmolality and hyponatremia: Secondary | ICD-10-CM | POA: Diagnosis not present

## 2021-12-04 DIAGNOSIS — Z28310 Unvaccinated for covid-19: Secondary | ICD-10-CM | POA: Diagnosis not present

## 2021-12-04 DIAGNOSIS — I10 Essential (primary) hypertension: Secondary | ICD-10-CM | POA: Diagnosis not present

## 2021-12-04 DIAGNOSIS — I4892 Unspecified atrial flutter: Secondary | ICD-10-CM | POA: Diagnosis not present

## 2021-12-04 DIAGNOSIS — R7989 Other specified abnormal findings of blood chemistry: Secondary | ICD-10-CM | POA: Diagnosis present

## 2021-12-04 DIAGNOSIS — J439 Emphysema, unspecified: Secondary | ICD-10-CM | POA: Diagnosis not present

## 2021-12-04 DIAGNOSIS — D649 Anemia, unspecified: Secondary | ICD-10-CM | POA: Diagnosis present

## 2021-12-04 DIAGNOSIS — R197 Diarrhea, unspecified: Secondary | ICD-10-CM | POA: Diagnosis present

## 2021-12-04 DIAGNOSIS — R6520 Severe sepsis without septic shock: Secondary | ICD-10-CM | POA: Diagnosis present

## 2021-12-04 DIAGNOSIS — Z79899 Other long term (current) drug therapy: Secondary | ICD-10-CM

## 2021-12-04 LAB — CBC WITH DIFFERENTIAL/PLATELET
Abs Immature Granulocytes: 0.3 10*3/uL — ABNORMAL HIGH (ref 0.00–0.07)
Band Neutrophils: 15 %
Basophils Absolute: 0 10*3/uL (ref 0.0–0.1)
Basophils Relative: 0 %
Eosinophils Absolute: 0 10*3/uL (ref 0.0–0.5)
Eosinophils Relative: 0 %
HCT: 27.7 % — ABNORMAL LOW (ref 39.0–52.0)
Hemoglobin: 9.8 g/dL — ABNORMAL LOW (ref 13.0–17.0)
Lymphocytes Relative: 36 %
Lymphs Abs: 0.5 10*3/uL — ABNORMAL LOW (ref 0.7–4.0)
MCH: 29.7 pg (ref 26.0–34.0)
MCHC: 35.4 g/dL (ref 30.0–36.0)
MCV: 83.9 fL (ref 80.0–100.0)
Metamyelocytes Relative: 17 %
Monocytes Absolute: 0 10*3/uL — ABNORMAL LOW (ref 0.1–1.0)
Monocytes Relative: 3 %
Myelocytes: 4 %
Neutro Abs: 0.5 10*3/uL — ABNORMAL LOW (ref 1.7–7.7)
Neutrophils Relative %: 23 %
Platelets: 82 10*3/uL — ABNORMAL LOW (ref 150–400)
Promyelocytes Relative: 2 %
RBC: 3.3 MIL/uL — ABNORMAL LOW (ref 4.22–5.81)
RDW: 17.2 % — ABNORMAL HIGH (ref 11.5–15.5)
WBC: 1.3 10*3/uL — CL (ref 4.0–10.5)
nRBC: 0 % (ref 0.0–0.2)

## 2021-12-04 LAB — PROTIME-INR
INR: 1.4 — ABNORMAL HIGH (ref 0.8–1.2)
Prothrombin Time: 17.1 seconds — ABNORMAL HIGH (ref 11.4–15.2)

## 2021-12-04 LAB — URINALYSIS, ROUTINE W REFLEX MICROSCOPIC
Bacteria, UA: NONE SEEN
Bilirubin Urine: NEGATIVE
Glucose, UA: NEGATIVE mg/dL
Ketones, ur: NEGATIVE mg/dL
Leukocytes,Ua: NEGATIVE
Nitrite: NEGATIVE
Protein, ur: NEGATIVE mg/dL
Specific Gravity, Urine: 1.002 — ABNORMAL LOW (ref 1.005–1.030)
pH: 7 (ref 5.0–8.0)

## 2021-12-04 LAB — C DIFFICILE QUICK SCREEN W PCR REFLEX
C Diff antigen: NEGATIVE
C Diff interpretation: NOT DETECTED
C Diff toxin: NEGATIVE

## 2021-12-04 LAB — APTT: aPTT: 37 seconds — ABNORMAL HIGH (ref 24–36)

## 2021-12-04 LAB — RESP PANEL BY RT-PCR (FLU A&B, COVID) ARPGX2
Influenza A by PCR: NEGATIVE
Influenza B by PCR: NEGATIVE
SARS Coronavirus 2 by RT PCR: NEGATIVE

## 2021-12-04 LAB — MAGNESIUM: Magnesium: 1.3 mg/dL — ABNORMAL LOW (ref 1.7–2.4)

## 2021-12-04 LAB — HIV ANTIBODY (ROUTINE TESTING W REFLEX): HIV Screen 4th Generation wRfx: NONREACTIVE

## 2021-12-04 LAB — LACTIC ACID, PLASMA: Lactic Acid, Venous: 3.2 mmol/L (ref 0.5–1.9)

## 2021-12-04 LAB — TSH: TSH: 0.612 u[IU]/mL (ref 0.350–4.500)

## 2021-12-04 LAB — MRSA NEXT GEN BY PCR, NASAL: MRSA by PCR Next Gen: NOT DETECTED

## 2021-12-04 LAB — STREP PNEUMONIAE URINARY ANTIGEN: Strep Pneumo Urinary Antigen: POSITIVE — AB

## 2021-12-04 MED ORDER — POTASSIUM CHLORIDE CRYS ER 20 MEQ PO TBCR
40.0000 meq | EXTENDED_RELEASE_TABLET | Freq: Once | ORAL | Status: AC
  Administered 2021-12-04: 40 meq via ORAL
  Filled 2021-12-04: qty 2

## 2021-12-04 MED ORDER — CHLORHEXIDINE GLUCONATE CLOTH 2 % EX PADS
6.0000 | MEDICATED_PAD | Freq: Every day | CUTANEOUS | Status: DC
  Administered 2021-12-04 – 2021-12-12 (×9): 6 via TOPICAL

## 2021-12-04 MED ORDER — METRONIDAZOLE 500 MG/100ML IV SOLN
500.0000 mg | Freq: Once | INTRAVENOUS | Status: AC
  Administered 2021-12-04: 500 mg via INTRAVENOUS
  Filled 2021-12-04: qty 100

## 2021-12-04 MED ORDER — ENOXAPARIN SODIUM 40 MG/0.4ML IJ SOSY
40.0000 mg | PREFILLED_SYRINGE | INTRAMUSCULAR | Status: DC
Start: 2021-12-04 — End: 2021-12-04

## 2021-12-04 MED ORDER — POTASSIUM CHLORIDE 10 MEQ/100ML IV SOLN
10.0000 meq | INTRAVENOUS | Status: AC
  Administered 2021-12-04 (×2): 10 meq via INTRAVENOUS
  Filled 2021-12-04 (×2): qty 100

## 2021-12-04 MED ORDER — IPRATROPIUM-ALBUTEROL 0.5-2.5 (3) MG/3ML IN SOLN
3.0000 mL | Freq: Three times a day (TID) | RESPIRATORY_TRACT | Status: DC
  Administered 2021-12-04 – 2021-12-12 (×24): 3 mL via RESPIRATORY_TRACT
  Filled 2021-12-04 (×24): qty 3

## 2021-12-04 MED ORDER — ACETAMINOPHEN 325 MG PO TABS
ORAL_TABLET | ORAL | Status: AC
  Administered 2021-12-04: 975 mg via ORAL
  Filled 2021-12-04: qty 1

## 2021-12-04 MED ORDER — ACETAMINOPHEN 325 MG PO TABS: 650.0000 mg | ORAL_TABLET | Freq: Once | ORAL | Status: DC

## 2021-12-04 MED ORDER — ACETAMINOPHEN 325 MG PO TABS: 975.0000 mg | ORAL_TABLET | Freq: Once | ORAL | Status: AC

## 2021-12-04 MED ORDER — ONDANSETRON HCL 4 MG PO TABS: 4.0000 mg | ORAL_TABLET | Freq: Four times a day (QID) | ORAL | Status: DC | PRN

## 2021-12-04 MED ORDER — LACTATED RINGERS IV BOLUS (SEPSIS)
500.0000 mL | Freq: Once | INTRAVENOUS | Status: AC
  Administered 2021-12-04: 500 mL via INTRAVENOUS

## 2021-12-04 MED ORDER — SODIUM CHLORIDE 0.9 % IV SOLN
2.0000 g | Freq: Once | INTRAVENOUS | Status: AC
  Administered 2021-12-04: 2 g via INTRAVENOUS
  Filled 2021-12-04: qty 12.5

## 2021-12-04 MED ORDER — BUDESONIDE 0.25 MG/2ML IN SUSP
0.2500 mg | Freq: Two times a day (BID) | RESPIRATORY_TRACT | Status: DC
  Administered 2021-12-04 – 2021-12-06 (×4): 0.25 mg via RESPIRATORY_TRACT
  Filled 2021-12-04 (×4): qty 2

## 2021-12-04 MED ORDER — SODIUM CHLORIDE 0.9 % IV SOLN
2.0000 g | INTRAVENOUS | Status: AC
  Administered 2021-12-04 – 2021-12-08 (×5): 2 g via INTRAVENOUS
  Filled 2021-12-04 (×5): qty 20

## 2021-12-04 MED ORDER — VANCOMYCIN HCL IN DEXTROSE 1-5 GM/200ML-% IV SOLN
1000.0000 mg | Freq: Once | INTRAVENOUS | Status: AC
  Administered 2021-12-04: 1000 mg via INTRAVENOUS
  Filled 2021-12-04: qty 200

## 2021-12-04 MED ORDER — LACTATED RINGERS IV BOLUS (SEPSIS)
250.0000 mL | Freq: Once | INTRAVENOUS | Status: AC
  Administered 2021-12-04: 250 mL via INTRAVENOUS

## 2021-12-04 MED ORDER — ACETAMINOPHEN 325 MG PO TABS
650.0000 mg | ORAL_TABLET | Freq: Four times a day (QID) | ORAL | Status: DC | PRN
  Administered 2021-12-05: 650 mg via ORAL
  Filled 2021-12-04: qty 2

## 2021-12-04 MED ORDER — LACTATED RINGERS IV BOLUS (SEPSIS)
1000.0000 mL | Freq: Once | INTRAVENOUS | Status: AC
  Administered 2021-12-04: 1000 mL via INTRAVENOUS

## 2021-12-04 MED ORDER — ACETAMINOPHEN 650 MG RE SUPP: 650.0000 mg | Freq: Four times a day (QID) | RECTAL | Status: DC | PRN

## 2021-12-04 MED ORDER — ONDANSETRON HCL 4 MG/2ML IJ SOLN: 4.0000 mg | Freq: Four times a day (QID) | INTRAMUSCULAR | Status: DC | PRN

## 2021-12-04 MED ORDER — SODIUM CHLORIDE 0.9 % IV SOLN
500.0000 mg | INTRAVENOUS | Status: AC
  Administered 2021-12-04 – 2021-12-08 (×5): 500 mg via INTRAVENOUS
  Filled 2021-12-04 (×5): qty 5

## 2021-12-04 MED ORDER — MAGNESIUM SULFATE 2 GM/50ML IV SOLN
2.0000 g | Freq: Once | INTRAVENOUS | Status: AC
  Administered 2021-12-04: 2 g via INTRAVENOUS
  Filled 2021-12-04: qty 50

## 2021-12-04 MED ORDER — LACTATED RINGERS IV SOLN: INTRAVENOUS | Status: AC

## 2021-12-04 MED ORDER — ACETAMINOPHEN 325 MG PO TABS
650.0000 mg | ORAL_TABLET | Freq: Once | ORAL | Status: DC
Start: 2021-12-04 — End: 2021-12-04
  Filled 2021-12-04: qty 2

## 2021-12-04 NOTE — ED Triage Notes (Signed)
Patient states diarrhea since Friday and nuasea today and he has been feeling hot. Patient states he has not had a thermometer to check is temperature. Patient states dizziness and lack of appetite.

## 2021-12-04 NOTE — Assessment & Plan Note (Addendum)
-   Patient met criteria for sepsis at time of admission with leukopenia, tachypnea, tachycardia, elevated lactic acid, source of infection appreciated with infiltrates in his lungs (right lower lung) and having hypoxia as part of organ dysfunction. -MEWS score 5 at time of admission, currently 3-4 ([place]) -Continue current IV antibiotics -Continue mucolytic management, wean oxygen supplementation as tolerated and continue bronchodilators. -No wheezing on exam and overall demonstrating improvement in blood pressure and decreasing his heart rate. -Still tachypneic, but on exam not demonstrating labor breathing.. -Low threshold for steroid initiation. -Continue the use of flutter valve and start incentive spirometer. -continue mucolytics. -Follow culture results and clinical response -lactic acid WNL now.

## 2021-12-04 NOTE — Assessment & Plan Note (Signed)
-  Cessation counseling provided -Nicotine patch has been ordered. 

## 2021-12-04 NOTE — Assessment & Plan Note (Addendum)
-   Patient reports since 2019 -Congratulated and encouraged to keep himself alcohol free. -No withdrawal symptoms.

## 2021-12-04 NOTE — ED Notes (Signed)
Critical Lab: Lactic is 3.2. Provider notified.

## 2021-12-04 NOTE — ED Notes (Signed)
Pt placed on 2L [place] for sats in upper 80s-lower 90s

## 2021-12-04 NOTE — Assessment & Plan Note (Addendum)
-  Blood pressure soft but stable overall with a map above 70 -Continue to follow vital signs. -Continue holding antihypertensive agents.

## 2021-12-04 NOTE — Assessment & Plan Note (Addendum)
-  In the setting of GI losses -Continue to provide repletion as needed -Follow electrolytes trend.

## 2021-12-04 NOTE — Assessment & Plan Note (Addendum)
-  Follow any GI service recommendations. Planning for outpatient liver biopsy -Continue IV fluids and treatment for sepsis meanwhile. -LFTs continue trending down.

## 2021-12-04 NOTE — ED Provider Notes (Signed)
[facility] EMERGENCY DEPARTMENT Provider Note   CSN: [id number] Arrival date & time: 12/04/21  8453     History  Chief Complaint  Patient presents with   Nausea   Diarrhea    Nicholas Avila is a 61 y.o. male.   Diarrhea Associated symptoms: chills and fever   Associated symptoms: no abdominal pain, no arthralgias, no headaches, no myalgias and no vomiting         Nicholas Avila is a 61 y.o. male with past medical history of atrial flutter, prior alcohol use, transient atrial fibrillation and anemia who presents to the Emergency Department complaining of shaking chills, nausea, diarrhea and intermittent cough.  Symptoms began 2 days ago after coming home from work.  States he initially began having dark watery stools that have now turned brown in color.  States his stools are mostly water and occurring about every 15 to 20 minutes.  Diarrhea has been associated with nausea.  No recent antibiotic use.  He denies having any abdominal pain, but states his abdomen feels "bloated."  Diarrhea and nausea have been associated with some dizziness, mostly upon standing, mild cough and lack of appetite.  Continuing to drink fluids and drink some Pedialyte this morning.  He does note that he has had significant weight loss in the last 6 months.  He denies any chest pain, shortness of breath, or dysuria symptoms.  Felt as though he was feverish at home but does not have a thermometer to check his temperature.     Home Medications Prior to Admission medications   Medication Sig Start Date End Date Taking? Authorizing Provider  amLODipine (NORVASC) 10 MG tablet Take 10 mg by mouth daily. 06/10/21   [provider]  calcium carbonate (OS-CAL) 1250 (500 Ca) MG chewable tablet Chew 1 tablet by mouth daily.    [provider]  HYDROcodone-acetaminophen (NORCO/VICODIN) 5-325 MG tablet One tablet every six hours for pain.  Limit 7 days. 10/17/21   Sanjuana Kava, MD  polyethylene  glycol-electrolytes (TRILYTE) 420 g solution Take 4,000 mLs by mouth as directed. 10/03/21   Rourk, Cristopher Estimable, MD      Allergies    Patient has no known allergies.    Review of Systems   Review of Systems  Constitutional:  Positive for appetite change, chills and fever.  HENT:  Negative for congestion, sore throat and trouble swallowing.   Respiratory:  Positive for cough. Negative for shortness of breath.   Cardiovascular:  Negative for chest pain and palpitations.  Gastrointestinal:  Positive for diarrhea and nausea. Negative for abdominal pain, blood in stool and vomiting.  Genitourinary:  Negative for dysuria and hematuria.  Musculoskeletal:  Negative for arthralgias and myalgias.  Skin:  Negative for rash.  Neurological:  Positive for dizziness and weakness. Negative for seizures, syncope, numbness and headaches.  Psychiatric/Behavioral:  Negative for confusion.     Physical Exam Updated Vital Signs BP 96/63 Comment:  @ 3 L/M  Pulse (!) 108   Temp 100 F (37.8 C) (Oral)   Resp (!) 24   Ht '5\' 11"'$  (1.803 m)   Wt 53.5 kg   SpO2 92%   BMI 16.46 kg/m  Physical Exam Vitals and nursing note reviewed.  Constitutional:      General: He is not in acute distress.    Appearance: Normal appearance. He is not toxic-appearing.  HENT:     Right Ear: Tympanic membrane and ear canal normal.     Left Ear:  Tympanic membrane and ear canal normal.     Mouth/Throat:     Mouth: Mucous membranes are moist.     Pharynx: Oropharynx is clear. No oropharyngeal exudate or posterior oropharyngeal erythema.  Eyes:     Conjunctiva/sclera: Conjunctivae normal.     Pupils: Pupils are equal, round, and reactive to light.  Cardiovascular:     Rate and Rhythm: Normal rate and regular rhythm.     Pulses: Normal pulses.  Pulmonary:     Effort: Pulmonary effort is normal.     Breath sounds: Rhonchi (some rhonchi on right) present.  Abdominal:     General: There is no distension.     Palpations:  Abdomen is soft. There is no mass.     Tenderness: There is no abdominal tenderness. There is no guarding.  Musculoskeletal:     Cervical back: Normal range of motion. No rigidity.     Right lower leg: No edema.     Left lower leg: No edema.  Lymphadenopathy:     Cervical: No cervical adenopathy.  Skin:    General: Skin is warm.     Capillary Refill: Capillary refill takes less than 2 seconds.     Findings: No rash.  Neurological:     General: No focal deficit present.     Mental Status: He is alert.     Sensory: No sensory deficit.     Motor: No weakness.     ED Results / Procedures / Treatments   Labs (all labs ordered are listed, but only abnormal results are displayed) Labs Reviewed  COMPREHENSIVE METABOLIC PANEL - Abnormal; Notable for the following components:      Result Value   Sodium 130 (*)    Potassium 2.7 (*)    Chloride 95 (*)    Calcium 7.7 (*)    Albumin 2.4 (*)    AST 108 (*)    ALT 58 (*)    Alkaline Phosphatase 185 (*)    Total Bilirubin 4.5 (*)    All other components within normal limits  LACTIC ACID, PLASMA - Abnormal; Notable for the following components:   Lactic Acid, Venous 2.9 (*)    All other components within normal limits  LACTIC ACID, PLASMA - Abnormal; Notable for the following components:   Lactic Acid, Venous 3.2 (*)    All other components within normal limits  CBC WITH DIFFERENTIAL/PLATELET - Abnormal; Notable for the following components:   WBC 1.3 (*)    RBC 3.30 (*)    Hemoglobin 9.8 (*)    HCT 27.7 (*)    RDW 17.2 (*)    Platelets 82 (*)    Neutro Abs 0.5 (*)    Lymphs Abs 0.5 (*)    Monocytes Absolute 0.0 (*)    Abs Immature Granulocytes 0.30 (*)    All other components within normal limits  PROTIME-INR - Abnormal; Notable for the following components:   Prothrombin Time 17.1 (*)    INR 1.4 (*)    All other components within normal limits  URINALYSIS, ROUTINE W REFLEX MICROSCOPIC - Abnormal; Notable for the following  components:   Specific Gravity, Urine 1.002 (*)    Hgb urine dipstick SMALL (*)    All other components within normal limits  APTT - Abnormal; Notable for the following components:   aPTT 37 (*)    All other components within normal limits  MAGNESIUM - Abnormal; Notable for the following components:   Magnesium 1.3 (*)    All  other components within normal limits  CULTURE, BLOOD (ROUTINE X 2)  CULTURE, BLOOD (ROUTINE X 2)  RESP PANEL BY RT-PCR (FLU A&B, COVID) ARPGX2  URINE CULTURE  C DIFFICILE QUICK SCREEN W PCR REFLEX      EKG EKG Interpretation  Date/Time:  Sunday December 04 2021 09:23:22 EDT Ventricular Rate:  135 PR Interval:  120 QRS Duration: 96 QT Interval:  294 QTC Calculation: 441 R Axis:   91 Text Interpretation: Sinus tachycardia Right axis deviation Consider left ventricular hypertrophy Confirmed by Sherwood Gambler [phone]) on 12/04/2021 9:33:53 AM  Radiology DG Chest 2 View  Result Date: 12/04/2021 CLINICAL DATA:  Suspected sepsis EXAM: CHEST - 2 VIEW COMPARISON:  07/27/2020 FINDINGS: Airspace disease in the right middle and lower lobes. No edema, effusion, or pneumothorax. Nipple shadows based on on CT from 2022. Normal heart size and mediastinal contours. IMPRESSION: Pneumonia at the right lung base. Followup PA and lateral chest X-ray is recommended in 3-4 weeks following trial of antibiotic therapy to ensure resolution. Electronically Signed   By: Jorje Guild M.D.   On: 12/04/2021 10:17    Procedures Procedures    Medications Ordered in ED Medications  lactated ringers infusion ( Intravenous New Bag/Given 12/04/21 0954)  potassium chloride 10 mEq in 100 mL IVPB (10 mEq Intravenous New Bag/Given 12/04/21 1129)  magnesium sulfate IVPB 2 g 50 mL (2 g Intravenous New Bag/Given 12/04/21 1157)  acetaminophen (TYLENOL) tablet 975 mg (975 mg Oral Given 12/04/21 0937)  lactated ringers bolus 1,000 mL (0 mLs Intravenous Stopped 12/04/21 1042)    And  lactated ringers  bolus 500 mL (0 mLs Intravenous Stopped 12/04/21 1151)    And  lactated ringers bolus 250 mL (0 mLs Intravenous Stopped 12/04/21 1151)  metroNIDAZOLE (FLAGYL) IVPB 500 mg (0 mg Intravenous Stopped 12/04/21 1055)  ceFEPIme (MAXIPIME) 2 g in sodium chloride 0.9 % 100 mL IVPB (0 g Intravenous Stopped 12/04/21 1055)  vancomycin (VANCOCIN) IVPB 1000 mg/200 mL premix (1,000 mg Intravenous New Bag/Given 12/04/21 1059)  potassium chloride SA (KLOR-CON M) CR tablet 40 mEq (40 mEq Oral Given 12/04/21 1129)    ED Course/ Medical Decision Making/ A&P                           Medical Decision Making Patient here for evaluation of generalized weakness, dizziness, subjective fever and chills nausea and diarrhea.  Symptoms present x2 days.  No known COVID exposures, states he had COVID 2 years ago has not been vaccinated.  Occasional cough but no shortness of breath or chest pain.  No reported abdominal pain  On exam, patient is ill-appearing, presentation concerning for sepsis.  Initial fever of 102.1.  Tachycardic with heart rate 135.  He is alert and mentating well.  No increased work of breathing and lung sounds are clear bilaterally, blood pressure reassuring at 017 systolic.  His mucous membranes are moist.  Code sepsis activated, although I suspect this may be related to a viral process.  Patient's presenting symptoms involve a high risk of complications along with multiple differential diagnoses, most concerning would be pneumonia, urosepsis, acute abdominal process.  Given patient's lack of abdominal pain, surgical abdomen is felt less likely.  Amount and/or Complexity of Data Reviewed Labs: ordered.    Details: Labs interpreted by me, leukopenic with a white count of 1.3, platelets 82, hemoglobin 9.8, hemoglobin near baseline of 10.2 in May of this year.  Magnesium 1.3, urinalysis without evidence  of infection. Lactic acid elevated at 2.9, chemistries hypokalemic with potassium of 2.7 transaminases mildly  elevated.  Has an elevated total bilirubin of 4.5.  This is elevated from 2 days ago.  COVID and flu test are negative. Radiology: ordered.    Details: Chest x-ray shows pneumonia at the right base ECG/medicine tests: ordered.    Details: EKG shows sinus tachycardia Discussion of management or test interpretation with external provider(s): Patient presented to the ER, appeared septic on arrival.  Sepsis protocol was initiated with IV fluids and cultures.  He appears stable, but ill.  Febrile on arrival at 102 orally.  He was given Tylenol for his fever.  Antibiotics initiated on arrival.  Patient was also given magnesium and potassium.  He has required supplemental oxygen during his ER stay.  He is currently on 3 L O2 by nasal cannula maintaining sats in the low 90s.  He is continued to have some brown liquid stools here.  He is critically ill and will require hospitalization.  Pt is agreeable to this plan.  Discussed findings with [facility] hospitalist, Dr. Dyann Kief who agrees to admit  Risk Decision regarding hospitalization.   CRITICAL CARE Performed by: Fue Cervenka Total critical care time: 40 minutes Critical care time was exclusive of separately billable procedures and treating other patients. Critical care was necessary to treat or prevent imminent or life-threatening deterioration. Critical care was time spent personally by me on the following activities: development of treatment plan with patient and/or surrogate as well as nursing, discussions with consultants, evaluation of patient's response to treatment, examination of patient, obtaining history from patient or surrogate, ordering and performing treatments and interventions, ordering and review of laboratory studies, ordering and review of radiographic studies, pulse oximetry and re-evaluation of patient's condition.         Final Clinical Impression(s) / ED Diagnoses Final diagnoses:  Sepsis due to pneumonia ([place])  Diarrhea,  unspecified type    Rx / DC Orders ED Discharge Orders     None         Kem Parkinson, PA-C 12/04/21 1235    Sherwood Gambler, MD 12/08/21 2122

## 2021-12-04 NOTE — ED Notes (Signed)
Wife notified of patient moving to room [facility]. Report called to ICU RN.

## 2021-12-04 NOTE — H&P (Signed)
History and Physical    Patient: Nicholas Avila VQM:[id number] DOB: [birth year] DOA: 12/04/2021 DOS: the patient was seen and examined on 12/04/2021 PCP: [name], MD  Patient coming from: Home  Chief Complaint:  Chief Complaint  Patient presents with   Nausea   Diarrhea   HPI: Nicholas Avila is a 61 y.o. male with medical history significant of history of alcoholism in remission, tobacco abuse, prior history of transient atrial flutter/atrial fibrillation and hypertension; who presented to the hospital secondary to diarrhea, chills general malaise.  Patient reports symptoms present for the last 2 days and worsening.  Work-up in the ED demonstrating sepsis with findings suggesting right lower lobe pneumonia.  Patient reports coughing spells, no nausea or vomiting.  No focal weakness. Patient is demonstrating tachypnea, tachycardia, elevated temperature (102.5) and complains of bloating and diarrhea.  Lactic acid elevated in the setting of sepsis.  Cultures taken, fluid resuscitation initiated and IV antibiotics started.  [facility] has been contacted to place patient in the hospital for further evaluation and management.  Work-up also demonstrated transaminitis (something for why he is actively following up with GI as an outpatient), hypomagnesemia, hypokalemia, normal TSH and negative influenza/COVID PCR.  Review of Systems: As mentioned in the history of present illness. All other systems reviewed and are negative. Past Medical History:  Diagnosis Date   Alcoholism in remission ([place])    quit 12/10/17. longest time alcohol free. reports multiple previous DUIs (04/08/18)   Atrial flutter ([place])    a. diagnosed in 11/2017 during admission for Sepsis b. s/p successful DCCV in 01/2018.   Dysrhythmia    AFib   Pulmonary nodules    Tobacco use    Past Surgical History:  Procedure Laterality Date   CARDIOVERSION N/A 01/24/2018   Procedure: CARDIOVERSION;  Surgeon: Arnoldo Lenis,  MD;  Location: [facility];  Service: Endoscopy;  Laterality: N/A;   COLONOSCOPY WITH PROPOFOL N/A 05/23/2018   Procedure: COLONOSCOPY WITH PROPOFOL;  Surgeon: Daneil Dolin, MD;  Location: [facility];  Service: Endoscopy;  Laterality: N/A;  8:30am   POLYPECTOMY  05/23/2018   Procedure: POLYPECTOMY;  Surgeon: Daneil Dolin, MD;  Location: [facility];  Service: Endoscopy;;  cecal polyp, descending polyp   Social History:  reports that he has been smoking cigarettes. He has a 26.25 pack-year smoking history. He has never used smokeless tobacco. He reports current alcohol use. He reports current drug use. Drug: Marijuana.  No Known Allergies  Family History  Problem Relation Age of Onset   Prostate cancer Father        late 84s   Heart disease Maternal Aunt    Colon cancer Neg Hx    Liver disease Neg Hx     Prior to Admission medications   Medication Sig Start Date End Date Taking? Authorizing Provider  amLODipine (NORVASC) 10 MG tablet Take 10 mg by mouth daily. 06/10/21   [provider]  calcium carbonate (OS-CAL) 1250 (500 Ca) MG chewable tablet Chew 1 tablet by mouth daily.    [provider]  HYDROcodone-acetaminophen (NORCO/VICODIN) 5-325 MG tablet One tablet every six hours for pain.  Limit 7 days. 10/17/21   Sanjuana Kava, MD  polyethylene glycol-electrolytes (TRILYTE) 420 g solution Take 4,000 mLs by mouth as directed. 10/03/21   Daneil Dolin, MD    Physical Exam: Vitals:   12/04/21 1130 12/04/21 1145 12/04/21 1200 12/04/21 1214  BP: 101/72  96/63   Pulse: (!) 113 Marland Kitchen)  112 (!) 112 (!) 108  Resp: (!) 34 (!) 30 (!) 38 (!) 24  Temp:      TempSrc:      SpO2: 90% 90% 91% 92%  Weight:      Height:       General exam: Alert, awake, oriented x 3; in mild to moderate distress secondary to tachypnea and some difficulty speaking in full sentences.  Currently afebrile.  Expressing no feeling well. Respiratory system: Positive rhonchi, appreciated  tachypnea. Cardiovascular system: Sinus tachycardia, no, no gallops, no JVD Gastrointestinal system: Abdomen is nondistended, soft and nontender. No organomegaly or masses felt. Normal bowel sounds heard. Central nervous system: Alert and oriented. No focal neurological deficits. Extremities: No cyanosis or clubbing. Skin: No petechiae. Psychiatry: Judgement and insight appear normal. Mood & affect appropriate.   Data Reviewed: Chest x-ray demonstrating right lower lobe infiltrates. CBC: Demonstrating WBCs of 1.3 (leukopenia), hemoglobin 1.8, platelet count 82,000 K Comprehensive metabolic panel: Sodium 917, potassium 2.7, chloride 95, BUN 8, creatinine 0.65 Lactic acid 3.2 TSH 0.612 Magnesium 1.3 Urinalysis with a specific gravity of 1.002 negative nitrites, ketones, protein and leukocyte esterase.  Assessment and Plan: * Severe sepsis ([place]) - Patient met criteria for sepsis at time of admission with leukopenia, tachypnea, tachycardia, elevated lactic acidosis, source of infection appreciated with infiltrates in his lungs (right lower lung) and having hypoxia as part of organ dysfunction. -MEWS score 5; in the need for stepdown bed on admission -Continue as needed bronchodilators, IV antibiotics, follow culture results, oxygen supplementation with intention to wean off as tolerated. -Mucolytic's and flutter valve has been ordered -Follow culture results, lactic acid trend and clinical response.  HTN (hypertension) - Blood pressure soft but stable -Continue aggressive fluid resuscitation -Holding antihypertensive agents currently. -Follow vital signs.  Hypomagnesemia - Magnesium level down to 1.3 most likely in association with GI losses -Will provide repletion and follow trend.  Hypokalemia - In the setting of GI losses -Provide repletion and follow electrolytes trend. -Cardiac monitoring in place.  Elevated LFTs - GI service will be consulted and will follow  recommendations -He is actively following with GI as an outpatient and had some recent blood work done already. -Continue IV fluids and treatment for sepsis meanwhile.  Diarrhea - Continue fluid resuscitation and electrolyte repletion -C. Diff and GI pathogen panel has been ordered. -follow clinical response and provide supportive care.  Alcoholism in remission [facility]) - Patient reports since 2019 -Congratulated and encouraged to keep himself alcohol free.  Tobacco abuse -Cessation counseling provided -Nicotine patch has been ordered.  Hyponatremia: -Will provide fluid resuscitation -Follow electrolytes trend.  Thrombocytopenia -In the setting of sepsis. -Will use SCDs and avoid heparin products. -Follow platelet count.   Advance Care Planning:   Code Status: Full Code   Consults: GI  Family Communication: no family at bedside  Severity of Illness: The appropriate patient status for this patient is INPATIENT. Inpatient status is judged to be reasonable and necessary in order to provide the required intensity of service to ensure the patient's safety. The patient's presenting symptoms, physical exam findings, and initial radiographic and laboratory data in the context of their chronic comorbidities is felt to place them at high risk for further clinical deterioration. Furthermore, it is not anticipated that the patient will be medically stable for discharge from the hospital within 2 midnights of admission.   * I certify that at the point of admission it is my clinical judgment that the patient will require inpatient hospital care  spanning beyond 2 midnights from the point of admission due to high intensity of service, high risk for further deterioration and high frequency of surveillance required.*  Author: Barton Dubois, MD 12/04/2021 12:53 PM  For on call review [email or web address].

## 2021-12-04 NOTE — Assessment & Plan Note (Addendum)
-   Continue to maintain adequate hydration and replete electrolytes as needed -C. Diff and GI pathogen panel negative -As needed Lomotil will be added.

## 2021-12-04 NOTE — Assessment & Plan Note (Addendum)
-   Continue to provide repletion as needed -Follow electrolytes trend. -Continue telemetry monitoring.

## 2021-12-05 ENCOUNTER — Telehealth: Payer: Self-pay | Admitting: Gastroenterology

## 2021-12-05 DIAGNOSIS — J189 Pneumonia, unspecified organism: Secondary | ICD-10-CM

## 2021-12-05 DIAGNOSIS — R197 Diarrhea, unspecified: Secondary | ICD-10-CM | POA: Diagnosis not present

## 2021-12-05 DIAGNOSIS — F1021 Alcohol dependence, in remission: Secondary | ICD-10-CM | POA: Diagnosis not present

## 2021-12-05 DIAGNOSIS — D696 Thrombocytopenia, unspecified: Secondary | ICD-10-CM

## 2021-12-05 DIAGNOSIS — R6520 Severe sepsis without septic shock: Secondary | ICD-10-CM | POA: Diagnosis not present

## 2021-12-05 DIAGNOSIS — E876 Hypokalemia: Secondary | ICD-10-CM

## 2021-12-05 DIAGNOSIS — Z720 Tobacco use: Secondary | ICD-10-CM

## 2021-12-05 DIAGNOSIS — R7989 Other specified abnormal findings of blood chemistry: Secondary | ICD-10-CM

## 2021-12-05 DIAGNOSIS — A419 Sepsis, unspecified organism: Secondary | ICD-10-CM | POA: Diagnosis not present

## 2021-12-05 LAB — CBC
HCT: 23.5 % — ABNORMAL LOW (ref 39.0–52.0)
Hemoglobin: 8.1 g/dL — ABNORMAL LOW (ref 13.0–17.0)
MCH: 29.3 pg (ref 26.0–34.0)
MCHC: 34.5 g/dL (ref 30.0–36.0)
MCV: 85.1 fL (ref 80.0–100.0)
Platelets: 73 10*3/uL — ABNORMAL LOW (ref 150–400)
RBC: 2.76 MIL/uL — ABNORMAL LOW (ref 4.22–5.81)
RDW: 17.4 % — ABNORMAL HIGH (ref 11.5–15.5)
WBC: 2.1 10*3/uL — ABNORMAL LOW (ref 4.0–10.5)
nRBC: 0 % (ref 0.0–0.2)

## 2021-12-05 LAB — GASTROINTESTINAL PANEL BY PCR, STOOL (REPLACES STOOL CULTURE)

## 2021-12-05 LAB — BASIC METABOLIC PANEL
Anion gap: 5 (ref 5–15)
BUN: 6 mg/dL (ref 6–20)
CO2: 26 mmol/L (ref 22–32)
Calcium: 7.6 mg/dL — ABNORMAL LOW (ref 8.9–10.3)
Chloride: 104 mmol/L (ref 98–111)
Creatinine, Ser: 0.43 mg/dL — ABNORMAL LOW (ref 0.61–1.24)
GFR, Estimated: >60 mL/min (ref >60)
Glucose, Bld: 101 mg/dL — ABNORMAL HIGH (ref 70–99)
Potassium: 2.4 mmol/L — CL (ref 3.5–5.1)
Sodium: 135 mmol/L (ref 135–145)

## 2021-12-05 LAB — PHOSPHORUS: Phosphorus: 2.1 mg/dL — ABNORMAL LOW (ref 2.5–4.6)

## 2021-12-05 LAB — MAGNESIUM: Magnesium: 1.7 mg/dL (ref 1.7–2.4)

## 2021-12-05 LAB — HEPATIC FUNCTION PANEL
ALT: 38 U/L (ref 0–44)
AST: 65 U/L — ABNORMAL HIGH (ref 15–41)
Albumin: 1.8 g/dL — ABNORMAL LOW (ref 3.5–5.0)
Alkaline Phosphatase: 139 U/L — ABNORMAL HIGH (ref 38–126)
Bilirubin, Direct: 2 mg/dL — ABNORMAL HIGH (ref 0.0–0.2)
Indirect Bilirubin: 1.2 mg/dL — ABNORMAL HIGH (ref 0.3–0.9)
Total Bilirubin: 3.2 mg/dL — ABNORMAL HIGH (ref 0.3–1.2)
Total Protein: 5.2 g/dL — ABNORMAL LOW (ref 6.5–8.1)

## 2021-12-05 LAB — URINE CULTURE: Culture: NO GROWTH

## 2021-12-05 LAB — LACTIC ACID, PLASMA: Lactic Acid, Venous: 4.2 mmol/L (ref 0.5–1.9)

## 2021-12-05 MED ORDER — LACTATED RINGERS IV SOLN: INTRAVENOUS | Status: AC

## 2021-12-05 MED ORDER — POTASSIUM CHLORIDE 10 MEQ/100ML IV SOLN
10.0000 meq | Freq: Once | INTRAVENOUS | Status: AC
  Administered 2021-12-05: 10 meq via INTRAVENOUS
  Filled 2021-12-05: qty 100

## 2021-12-05 MED ORDER — ADULT MULTIVITAMIN W/MINERALS CH
1.0000 | ORAL_TABLET | Freq: Every day | ORAL | Status: DC
  Administered 2021-12-05 – 2021-12-07 (×3): 1 via ORAL
  Filled 2021-12-05 (×3): qty 1

## 2021-12-05 MED ORDER — DIPHENOXYLATE-ATROPINE 2.5-0.025 MG PO TABS
1.0000 | ORAL_TABLET | Freq: Four times a day (QID) | ORAL | Status: DC | PRN
  Administered 2021-12-09: 1 via ORAL
  Filled 2021-12-05: qty 1

## 2021-12-05 MED ORDER — LORAZEPAM 2 MG/ML IJ SOLN: 1.0000 mg | INTRAMUSCULAR | Status: DC | PRN

## 2021-12-05 MED ORDER — NICOTINE 14 MG/24HR TD PT24
14.0000 mg | MEDICATED_PATCH | Freq: Every day | TRANSDERMAL | Status: DC
  Administered 2021-12-05 – 2021-12-11 (×7): 14 mg via TRANSDERMAL
  Filled 2021-12-05 (×6): qty 1

## 2021-12-05 MED ORDER — THIAMINE HCL 100 MG/ML IJ SOLN
100.0000 mg | Freq: Every day | INTRAMUSCULAR | Status: DC
  Filled 2021-12-05: qty 2

## 2021-12-05 MED ORDER — THIAMINE HCL 100 MG PO TABS
100.0000 mg | ORAL_TABLET | Freq: Every day | ORAL | Status: DC
  Administered 2021-12-05 – 2021-12-07 (×3): 100 mg via ORAL
  Filled 2021-12-05 (×3): qty 1

## 2021-12-05 MED ORDER — GUAIFENESIN-DM 100-10 MG/5ML PO SYRP
5.0000 mL | ORAL_SOLUTION | ORAL | Status: DC | PRN
  Administered 2021-12-05: 5 mL via ORAL
  Filled 2021-12-05: qty 5

## 2021-12-05 MED ORDER — POTASSIUM CHLORIDE 20 MEQ PO PACK
40.0000 meq | PACK | Freq: Once | ORAL | Status: AC
  Administered 2021-12-05: 40 meq via ORAL
  Filled 2021-12-05: qty 2

## 2021-12-05 MED ORDER — FOLIC ACID 1 MG PO TABS
1.0000 mg | ORAL_TABLET | Freq: Every day | ORAL | Status: DC
  Administered 2021-12-05 – 2021-12-07 (×3): 1 mg via ORAL
  Filled 2021-12-05 (×3): qty 1

## 2021-12-05 MED ORDER — LACTATED RINGERS IV SOLN: INTRAVENOUS | Status: DC

## 2021-12-05 MED ORDER — LORAZEPAM 1 MG PO TABS: 1.0000 mg | ORAL_TABLET | ORAL | Status: DC | PRN

## 2021-12-05 MED ORDER — DM-GUAIFENESIN ER 30-600 MG PO TB12
1.0000 | ORAL_TABLET | Freq: Two times a day (BID) | ORAL | Status: DC
  Administered 2021-12-05 – 2021-12-12 (×14): 1 via ORAL
  Filled 2021-12-05 (×14): qty 1

## 2021-12-05 MED ORDER — NICOTINE 14 MG/24HR TD PT24
MEDICATED_PATCH | TRANSDERMAL | Status: AC
  Filled 2021-12-05: qty 1

## 2021-12-05 MED ORDER — POTASSIUM CHLORIDE CRYS ER 20 MEQ PO TBCR
40.0000 meq | EXTENDED_RELEASE_TABLET | ORAL | Status: AC
  Administered 2021-12-05 – 2021-12-06 (×3): 40 meq via ORAL
  Filled 2021-12-05 (×3): qty 2

## 2021-12-05 MED ORDER — MAGNESIUM SULFATE 2 GM/50ML IV SOLN
2.0000 g | Freq: Once | INTRAVENOUS | Status: AC
  Administered 2021-12-05: 2 g via INTRAVENOUS
  Filled 2021-12-05: qty 50

## 2021-12-05 MED FILL — Oxycodone w/ Acetaminophen Tab 5-325 MG: ORAL | Qty: 6 | Status: AC

## 2021-12-05 NOTE — Consult Note (Signed)
Gastroenterology Consult   Referring Provider: No ref. provider found Primary Care Physician:  [name], MD Primary Gastroenterologist:  Dr. Gala Romney  Patient ID: Nicholas Avila; [id number]; 1960/09/12   Admit date: 12/04/2021  LOS: 1 day   Date of Consultation: 12/05/2021  Reason for Consultation:  transaminitis  History of Present Illness   Nicholas Avila is a 61 y.o. year old male with history of alcohol abuse, adenomatous colon polyps, atrial flutter, HTN, tobacco use disorder, and anemia who presented to the ED with diarrhea, chills, malaise, and fever worsening over the last 2 days prior to admission.  Patient presented with signs concerning for sepsis related to pulmonary infiltrates and had elevated LFTs for which GI was consulted for further evaluation and management.   ED Course:  Labs -WBC 1.3, hemoglobin 9.8, platelets 82, sodium 130, potassium 2.7, chloride 95, BUN 8, creatinine 0.65, lactate 3.2, TSH 0.612, magnesium 1.3 UA negative for nitrates, ketones, protein, leukocyte esterase CXR demonstrating right lower lobe infiltrates COVID and flu negative EKG with sinus tachycardia Sepsis protocol initiated he was given IV fluids and blood cultures obtained.  He was febrile on admission to 102 and given Tylenol.  Magnesium and potassium repleted.  Patient was placed on 3 L oxygen nasal cannula due to low oxygen saturations.  He continued to have diarrhea in the ED.  Consult: Patient seen and evaluated outpatient in the end of May 2023 for evaluation of transaminitis.  Patient reported drinking 3 to 412 ounce beers daily.  He denied history of illicit drug use, hepatitis exposure, tattoos, personal history of autoimmune conditions or any family history of liver disease.  His liver enzymes normalized shortly after July 2019 when he previously had elevated AST and alk phos.  They are elevated again in March 2022.  In February 2023 his AST was 111, ALT 58, alk phos 649,  T. bili 1.3 with normal platelets.  At this time patient also noted a change in bowel habits stating small-volume frequent bowel movements and some nocturnal stools.  Patient denies any melena or hematochezia.  Patient does have a history of adenomatous colon polyps on his last colonoscopy in January 2020.  He does have a repeat colonoscopy in 6 months which did not occur.  He also has a mild anemia with hemoglobin of 12 that have recently decreased to 10.7 with microcytic indices.  An array of labs were obtained including CBC, anemia panel, B12 and folate, ANA, ASMA, AMA, CMP, INR, acute hepatitis panel including hepatitis B surface antibody, TSH, stool studies and abdominal ultrasound was obtained the patient was scheduled for colonoscopy.  Prior labs in May with stable hemoglobin at 10.2, improvement of AST and ALT, alk phos 237, T. bili 0.9.  He was found to be immune to hepatitis A and B.  No active hepatitis A, B, or C.  His iron saturation and ferritin were elevated.  B12 folate normal.  ANA and AMA negative, ASMA slightly positive at 20.  IgA elevated at 748, IgG elevated at 2508.   Abdominal ultrasound 10/17/21 with hepatic steatosis, trace perihepatic ascites, mildly dilated CBD.  MRCP obtained 11/01/2021 revealing no biliary ductal dilation and evidence of hepatomegaly with mild hepatic steatosis and trace perihepatic ascites.  Patient was to have HFP and IgG completed this week for follow-up.  Liver biopsy was discussed with the patient previously which he declined and would rather work on alcohol cessation and repeating lab work.   Past Medical History:  Diagnosis  Date   Alcoholism in remission ([place])    quit 12/10/17. longest time alcohol free. reports multiple previous DUIs (04/08/18)   Atrial flutter ([place])    a. diagnosed in 11/2017 during admission for Sepsis b. s/p successful DCCV in 01/2018.   Dysrhythmia    AFib   Pulmonary nodules    Tobacco use     Past Surgical History:   Procedure Laterality Date   CARDIOVERSION N/A 01/24/2018   Procedure: CARDIOVERSION;  Surgeon: Arnoldo Lenis, MD;  [facility];  Service: Endoscopy;  Laterality: N/A;   COLONOSCOPY WITH PROPOFOL N/A 05/23/2018   Procedure: COLONOSCOPY WITH PROPOFOL;  Surgeon: Daneil Dolin, MD;  Location: [facility];  Service: Endoscopy;  Laterality: N/A;  8:30am   POLYPECTOMY  05/23/2018   Procedure: POLYPECTOMY;  Surgeon: Daneil Dolin, MD;  Location: [facility];  Service: Endoscopy;;  cecal polyp, descending polyp    Prior to Admission medications   Medication Sig Start Date End Date Taking? Authorizing Provider  acetaminophen (TYLENOL) 500 MG tablet Take 1,000 mg by mouth every 6 (six) hours as needed for moderate pain.   Yes [provider]  amLODipine (NORVASC) 10 MG tablet Take 10 mg by mouth daily. 06/10/21  Yes [provider]  HYDROcodone-acetaminophen (NORCO/VICODIN) 5-325 MG tablet One tablet every six hours for pain.  Limit 7 days. Patient taking differently: Take 1 tablet by mouth every 6 (six) hours as needed for moderate pain. 10/17/21  Yes Sanjuana Kava, MD  sildenafil (VIAGRA) 100 MG tablet Take 100 mg by mouth daily as needed. 09/23/21  Yes [provider]  polyethylene glycol-electrolytes (TRILYTE) 420 g solution Take 4,000 mLs by mouth as directed. Patient not taking: Reported on 12/04/2021 10/03/21   Daneil Dolin, MD    Current Facility-Administered Medications  Medication Dose Route Frequency Provider Last Rate Last Admin   acetaminophen (TYLENOL) tablet 650 mg  650 mg Oral Q6H PRN Barton Dubois, MD   650 mg at 12/05/21 0020   Or   acetaminophen (TYLENOL) suppository 650 mg  650 mg Rectal Q6H PRN Barton Dubois, MD       azithromycin (ZITHROMAX) 500 mg in sodium chloride 0.9 % 250 mL IVPB  500 mg Intravenous Q24H Barton Dubois, MD   Stopped at 12/04/21 1419   budesonide (PULMICORT) nebulizer solution 0.25 mg  0.25 mg Nebulization BID  Barton Dubois, MD   0.25 mg at 12/04/21 1350   cefTRIAXone (ROCEPHIN) 2 g in sodium chloride 0.9 % 100 mL IVPB  2 g Intravenous Q24H Barton Dubois, MD 200 mL/hr at 12/04/21 1824 2 g at 12/04/21 1824   Chlorhexidine Gluconate Cloth 2 % PADS 6 each  6 each Topical Daily Zierle-Ghosh, Asia B, DO   6 each at 12/04/21 2353   guaiFENesin-dextromethorphan (ROBITUSSIN DM) 100-10 MG/5ML syrup 5 mL  5 mL Oral Q4H PRN Barton Dubois, MD       ipratropium-albuterol (DUONEB) 0.5-2.5 (3) MG/3ML nebulizer solution 3 mL  3 mL Nebulization TID Barton Dubois, MD   3 mL at 12/04/21 2032   ondansetron (ZOFRAN) tablet 4 mg  4 mg Oral Q6H PRN Barton Dubois, MD       Or   ondansetron [facility]) injection 4 mg  4 mg Intravenous Q6H PRN Barton Dubois, MD        Allergies as of 12/04/2021   (No Known Allergies)    Family History  Problem Relation Age of Onset   Prostate cancer Father  late 70s   Heart disease Maternal Aunt    Colon cancer Neg Hx    Liver disease Neg Hx     Social History   Socioeconomic History   Marital status: Married    Spouse name: Not on file   Number of children: Not on file   Years of education: Not on file   Highest education level: Not on file  Occupational History   Not on file  Tobacco Use   Smoking status: Every Day    Packs/day: 0.75    Years: 35.00    Total pack years: 26.25    Types: Cigarettes   Smokeless tobacco: Never  Vaping Use   Vaping Use: Never used  Substance and Sexual Activity   Alcohol use: Yes    Comment: 3-4, 12 oz beer per day.   Drug use: Yes    Types: Marijuana    Comment: last used 01/22/2018   Sexual activity: Not Currently  Other Topics Concern   Not on file  Social History Narrative   Not on file   Social Determinants of Health   Financial Resource Strain: Not on file  Food Insecurity: Unknown (12/11/2017)   Hunger Vital Sign    Worried About Running Out of Food in the Last Year: Patient refused    [employer] in the  Last Year: Patient refused  Transportation Needs: Unknown (12/11/2017)   [place] - Hydrologist (Medical): Patient refused    Lack of Transportation (Non-Medical): Patient refused  Physical Activity: Unknown (12/11/2017)   Exercise Vital Sign    Days of Exercise per Week: Patient refused    Minutes of Exercise per Session: Patient refused  Stress: No Stress Concern Present (12/11/2017)   [place] of Stress : Not at all  Social Connections: Unknown (12/11/2017)   Social Connection and Isolation Panel [NHANES]    Frequency of Communication with Friends and Family: Patient refused    Frequency of Social Gatherings with Friends and Family: Patient refused    Attends Religious Services: Patient refused    Active Member of Clubs or Organizations: Patient refused    Attends Archivist Meetings: Patient refused    Marital Status: Patient refused  Intimate Partner Violence: Unknown (12/11/2017)   Humiliation, Afraid, Rape, and Kick questionnaire    Fear of Current or Ex-Partner: Patient refused    Emotionally Abused: Patient refused    Physically Abused: Patient refused    Sexually Abused: Patient refused     Review of Systems   Gen: Denies any fever, chills, loss of appetite, change in weight or weight loss CV: Denies chest pain, heart palpitations, syncope, edema  Resp: Denies shortness of breath with rest, cough, wheezing, coughing up blood, and pleurisy. GI: see HPI GU : Denies urinary burning, blood in urine, urinary frequency, and urinary incontinence. MS: Denies joint pain, limitation of movement, swelling, cramps, and atrophy.  Derm: Denies rash, itching, dry skin, hives. Psych: Denies depression, anxiety, memory loss, hallucinations, and confusion. Heme: Denies bruising or bleeding Neuro:  Denies any headaches, dizziness, paresthesias, shaking  Physical Exam    Vital Signs in last 24 hours: Temp:  [98.5 F (36.9 C)-102.1 F (38.9 C)] 99 F (37.2 C) (07/24 0730) Pulse Rate:  [102-141] 105 (07/23 1700) Resp:  [18-40] 20 (07/24 0802) BP: (90-140)/(52-81) 132/60 (07/24 0802) SpO2:  [89 %-98 %] 95 % (07/24 0802) Weight:  [  52.2 kg-53.7 kg] 53.7 kg (07/24 0501) Last BM Date : 12/04/21  General:   Alert,  Well-developed, well-nourished, pleasant and cooperative in NAD Head:  Normocephalic and atraumatic. Eyes:  Sclera clear, no icterus.   Conjunctiva pink. Ears:  Normal auditory acuity. Mouth:  No deformity or lesions, dentition normal. Lungs:  Clear throughout to auscultation.   No wheezes, crackles, or rhonchi. No acute distress. Heart:  Regular rate and rhythm; no murmurs, clicks, rubs,  or gallops. Abdomen:  Soft, nontender.  No distention. hepatomegaly. Normal bowel sounds, without guarding, and without rebound.   Rectal: deferred   Msk:  Symmetrical without gross deformities. Normal posture. Extremities:  Without clubbing or edema. Neurologic:  Alert and  oriented x4. Skin:  Intact without significant lesions or rashes. Psych:  Alert and cooperative. Normal mood and affect.  Intake/Output from previous day: 07/23 0701 - 07/24 0700 In: 3821.8 [P.O.:240; I.V.:959; IV Piggyback:2622.8] Out: 1375 [Urine:1375] Intake/Output this shift: No intake/output data recorded.   Labs/Studies   Recent Labs Recent Labs    12/04/21 0944 12/05/21 0418  WBC 1.3* 2.1*  HGB 9.8* 8.1*  HCT 27.7* 23.5*  PLT 82* 73*   BMET Recent Labs    12/04/21 0944 12/05/21 0418  NA 130* 135  K 2.7* 2.4*  CL 95* 104  CO2 25 26  GLUCOSE 81 101*  BUN 8 6  CREATININE 0.65 0.43*  CALCIUM 7.7* 7.6*   LFT Recent Labs    12/04/21 0944 12/05/21 0418  PROT 6.6 5.2*  ALBUMIN 2.4* 1.8*  AST 108* 65*  ALT 58* 38  ALKPHOS 185* 139*  BILITOT 4.5* 3.2*  BILIDIR  --  2.0*  IBILI  --  1.2*   PT/INR Recent Labs    12/04/21 0944  LABPROT 17.1*  INR  1.4*   Hepatitis Panel No results for input(s): "HEPBSAG", "HCVAB", "HEPAIGM", "HEPBIGM" in the last 72 hours. C-Diff Recent Labs    12/04/21 0925  CDIFFTOX NEGATIVE    Radiology/Studies DG Chest 2 View  Result Date: 12/04/2021 CLINICAL DATA:  Suspected sepsis EXAM: CHEST - 2 VIEW COMPARISON:  07/27/2020 FINDINGS: Airspace disease in the right middle and lower lobes. No edema, effusion, or pneumothorax. Nipple shadows based on on CT from 2022. Normal heart size and mediastinal contours. IMPRESSION: Pneumonia at the right lung base. Followup PA and lateral chest X-ray is recommended in 3-4 weeks following trial of antibiotic therapy to ensure resolution. Electronically Signed   By: Jorje Guild M.D.   On: 12/04/2021 10:17     Assessment   Nicholas Avila is a 61 y.o. year old male with history of alcohol abuse, adenomatous colon polyps, atrial flutter, HTN, tobacco use disorder, and anemia who presented to the ED with diarrhea, chills, malaise, and fever worsening over the last 2 days prior to admission.  Patient presented with signs concerning for sepsis and had elevated LFTs for which GI was consulted for further evaluation and management.  Elevated LFTs/Transaminitis: Patient originally evaluated for transaminitis outpatient on 10/03/21. He has had prior workup with autoimmune serologies, viral hepatitis, immunoglobulins, and anemia panel. Labs from May 2023 revealed elevated iron saturation, ferritin, IgG. Also he was negative for acute hepatitis and found to be immune to Hep A and Hep B. His ASMA was also slightly positive. IgG and IgA elevated in May and felt as though this could be related to alcohol use. He had outpatient ultrasound and MRI/MRCP with hepatomegaly and mild hepatic steatosis without hepatic lesions. Biliary ductal dilation  seen on abdominal ultrasound but not present on MRCP. He was recommended to have hemachromatosis testing and repeat IgG on 7/24. Patient presented to  the ED with chills, fever, nausea, and diarrhea therefore labs were checked revealing elevated LFTs however are improved from prior outpatient labs. LFTs are improving since admission and IV hydration: AST 65 (108), ALT 38 (58), Alk Phos 139 (185), T.Bili 3.2 (4.5). IgG 2338 (2508).  Hemochromatosis DNA labs are pending, he had these performed on Friday, 12/02/2021.  Unable to completely rule out autoimmune hepatitis or hemochromatosis at this time.  Patient has continued to drink 3 - 4 (12oz) beers a day.  Patient reports he was told that he could taper down in order to stop but has not done this yet.  I discussed with the patient that we are awaiting pending lab work and reinforced the importance of alcohol cessation.  We discussed possible progression of his disease to cirrhosis and further complications in the future.  Suspect that his ongoing elevation is result of continued alcohol consumption. Patient likely needs liver biopsy for concern for autoimmune hepatitis, we will reach out to our office to arrange this after patient is discharged from the hospital.    Diarrhea: This has been an ongoing issue for the patient for which he has received outpatient workup as well with negative TSH, giardia, cryptosporidium, and Cdiff in June 2023. Abdominal imaging negative for gallbladder and pancreatic process. This admission his TSH was also normal. GI pathogen panel and Cdiff negative.  Patient reports that prior to hospitalization stools were on the looser side at baseline but on Friday afternoon and into Saturday he started having small-volume watery bowel movements.  He stated this happened all day Saturday and kept him up all night Saturday night.  Since being in the hospital he reports his stool consistency has improved and is on the looser side but not watery as it was, he has had 3 bowel movements today.  He denies any melena or BRBPR.  Reports stools have been mostly dark green color. May use imodium as  needed for diarrhea.   Anemia: Outpatient he denied any overt GI bleeding or other blood loss. Workup thus far has revealed normal B12 and folate. Labs May 2023 with Iron 89, Iron sat 53%, and ferritin elevated at 990. Hgb 8.1 (9.8). Likely multifactorial given liver disease and dilution related to IV fluids. Patient was scheduled for outpatient colonoscopy and this was to be rescheduled in August but patient was unreachable when our office scheduler attempted to call him to reschedule. Patient denies any overt GI bleeding as of recent. Needs to reschedule colonoscopy outpatient.   Hypokalemia: Likely related to GI losses. Repletion per primary team.   Plan / Recommendations   Daily CBC, CMP Potassium repletion Follow up hemochromatosis DNA Alcohol cessation Imodium as needed. Heart healthy diet - 2g sodium limit Close outpatient follow up (3-4 weeks, after biopsy) Colonoscopy outpatient Liver biopsy outpatient ASAP after discharge, arranging  Per discussion with Dr. Jenetta Downer GI will sign off as we are arranging outpatient biopsy and follow up after patient recovers from acute illness. Thank you for the consultation.    12/05/2021, 8:52 AM  [name], MSN, FNP-BC, AGACNP-BC [facility]

## 2021-12-05 NOTE — Telephone Encounter (Signed)
Patient seen in the hospital for elevated LFTs.   Nicholas Avila - Please arrange for liver biopsy ASAP once patient discharged.  [name] - Please arrange hospital follow up for patient in 3-4 weeks (after liver biopsy performed).   [name], MSN, FNP-BC, AGACNP-BC [facility]

## 2021-12-05 NOTE — Progress Notes (Signed)
  Transition of Care [facility]) Screening Note   Patient Details  Name: BARNIE SOPKO Date of Birth: 11-28-1960   Transition of Care [facility]) CM/SW Contact:    Iona Beard, [place] Phone Number: 12/05/2021, 12:07 PM    Transition of Care Department [facility]) has reviewed patient and no TOC needs have been identified at this time. We will continue to monitor patient advancement through interdisciplinary progression rounds. If new patient transition needs arise, please place a TOC consult.

## 2021-12-05 NOTE — Progress Notes (Signed)
Asked pt if he still consumes alcohol and if so how many drinks per day and what product does he drink. Pt and pt's wife stated that he still actively drinks alcohol, about 4X 12 oz beers/ day. Last drink was Friday  12/02/2021. CIWA assessment preformed. Pt denies any symptoms but does state that he has had a HX of alcohol withdraws presenting with up to hallucinations, delusions, and sever agitation.

## 2021-12-05 NOTE — Progress Notes (Signed)
Progress Note   Patient: Nicholas Avila GEX:[id number] DOB: [birth year] DOA: 12/04/2021     1 DOS: the patient was seen and examined on 12/05/2021   Brief hospital admission course: ARISTON GRANDISON is a 61 y.o. male with medical history significant of history of alcoholism in remission, tobacco abuse, prior history of transient atrial flutter/atrial fibrillation and hypertension; who presented to the hospital secondary to diarrhea, chills general malaise.  Patient reports symptoms present for the last 2 days and worsening.  Work-up in the ED demonstrating sepsis with findings suggesting right lower lobe pneumonia.  Patient reports coughing spells, no nausea or vomiting.  No focal weakness. Patient is demonstrating tachypnea, tachycardia, elevated temperature (102.5) and complains of bloating and diarrhea.   Lactic acid elevated in the setting of sepsis.  Cultures taken, fluid resuscitation initiated and IV antibiotics started.  TR[facility] has been contacted to place patient in the hospital for further evaluation and management.   Work-up also demonstrated transaminitis (something for why he is actively following up with GI as an outpatient), hypomagnesemia, hypokalemia, normal TSH and negative influenza/COVID PCR.    Assessment and Plan: * Severe sepsis ([place]) - Patient met criteria for sepsis at time of admission with leukopenia, tachypnea, tachycardia, elevated lactic acid, source of infection appreciated with infiltrates in his lungs (right lower lung) and having hypoxia as part of organ dysfunction. -MEWS score 5 at time of admission, currently 3-4 ([place]) -Continue current IV antibiotics -Continue mucolytic management, wean oxygen supplementation as tolerated and continue bronchodilators. -No wheezing on exam and overall demonstrating improvement in blood pressure and decreasing his heart rate. -Still tachypneic. -Low threshold for steroid initiation. -Continue the use of flutter  valve.-Mucolytic's and flutter valve has been ordered -Follow culture results, lactic acid trend and clinical response.  HTN (hypertension) -Blood pressure soft but stable overall with a map above 70 -Continue to follow vital signs. -Continue holding antihypertensive agents.   Hypomagnesemia - Continue to provide repletion as needed -Follow electrolytes trend. -Continue telemetry monitoring.  Hypokalemia -In the setting of GI losses -Continue to provide repletion as needed -Follow electrolytes trend.  Elevated LFTs -Follow GI service recommendations. -Continue IV fluids and treatment for sepsis meanwhile. -LFTs trending down.  Diarrhea - Continue to maintain adequate hydration and replete electrolytes as needed -C. Diff and GI pathogen panel negative -As needed Lomotil will be added.  Alcoholism in remission [facility]) - Patient reports since 2019 -Congratulated and encouraged to keep himself alcohol free. -No withdrawal symptoms.  Tobacco abuse -Cessation counseling provided -Nicotine patch has been ordered.  Thrombocytopenia -No overt bleeding appreciated -Presume associated with underlying liver disease and prior history of longstanding alcoholism.  Sepsis most likely playing also a role in decreasing his platelets count. -Continue to follow platelets count -Avoid heparin products  Subjective:  Underweight, chronically ill in appearance; reports no chest pain, no nausea, no vomiting.  Couple loose stools overnight.  Experiencing intermittent coughing spells and having shortness of breath with minimal activity and short winded sensation.  No fever  Physical Exam: Vitals:   12/05/21 1000 12/05/21 1106 12/05/21 1501 12/05/21 1552  BP: (!) 92/56     Pulse:      Resp:      Temp:  99.4 F (37.4 C)  99.1 F (37.3 C)  TempSrc:  Oral  Oral  SpO2:   94%   Weight:      Height:       General exam: Alert, awake, oriented x 3; still requiring 2  L nasal cannula  supplementation to keep saturation above 90%.  Reports the presence of intermittent coughing spells; no fever, no chest pain, no nausea vomiting.  Reports a couple loose stools overnight.  No abdominal pain. Respiratory system: Right bases with positive rhonchi/Rales; no wheezing, no crackles, fair air movement bilaterally. Cardiovascular system:RRR. No murmurs, rubs, gallops.  No JVD. Gastrointestinal system: Abdomen is nondistended, soft and nontender. No organomegaly or masses felt. Normal bowel sounds heard. Central nervous system: Alert and oriented. No focal neurological deficits. Extremities: No cyanosis or clubbing. Skin: No petechiae. Psychiatry: Judgement and insight appear normal. Mood & affect appropriate.   Data Reviewed: Basic metabolic panel: Sodium 545, potassium 2.4, chloride 104, bicarb 26, BUN 6, creatinine 0.43 CBC: WBCs 2.1, hemoglobin 8.1, platelets count 73,000 LFTs: AST 65, ALT 38, alk phos 139, total bilirubin 3.2   Family Communication: Son and daughter at bedside.  Disposition: Status is: Inpatient Remains inpatient appropriate because: Still requiring IV antibiotics for ongoing sepsis in the setting of community-acquired pneumonia.   Planned Discharge Destination: Home   Author: Barton Dubois, MD 12/05/2021 6:26 PM  For on call review [email or web address].

## 2021-12-05 NOTE — Progress Notes (Signed)
Pt has critical value for potassium of 2.4, Dr Cherlynn Kaiser notified at 05:32 12/05/21, no additional orders given at present time,

## 2021-12-06 ENCOUNTER — Encounter: Payer: Self-pay | Admitting: Internal Medicine

## 2021-12-06 DIAGNOSIS — R197 Diarrhea, unspecified: Secondary | ICD-10-CM | POA: Diagnosis not present

## 2021-12-06 DIAGNOSIS — J189 Pneumonia, unspecified organism: Secondary | ICD-10-CM | POA: Diagnosis not present

## 2021-12-06 DIAGNOSIS — F1021 Alcohol dependence, in remission: Secondary | ICD-10-CM | POA: Diagnosis not present

## 2021-12-06 DIAGNOSIS — A419 Sepsis, unspecified organism: Secondary | ICD-10-CM | POA: Diagnosis not present

## 2021-12-06 LAB — COMPREHENSIVE METABOLIC PANEL
ALT: 36 U/L (ref 0–44)
AST: 50 U/L — ABNORMAL HIGH (ref 15–41)
Albumin: 1.9 g/dL — ABNORMAL LOW (ref 3.5–5.0)
Alkaline Phosphatase: 163 U/L — ABNORMAL HIGH (ref 38–126)
Anion gap: 7 (ref 5–15)
BUN: 5 mg/dL — ABNORMAL LOW (ref 6–20)
CO2: 26 mmol/L (ref 22–32)
Calcium: 8.2 mg/dL — ABNORMAL LOW (ref 8.9–10.3)
Chloride: 101 mmol/L (ref 98–111)
Creatinine, Ser: 0.33 mg/dL — ABNORMAL LOW (ref 0.61–1.24)
GFR, Estimated: >60 mL/min (ref >60)
Glucose, Bld: 108 mg/dL — ABNORMAL HIGH (ref 70–99)
Potassium: 2.5 mmol/L — CL (ref 3.5–5.1)
Sodium: 134 mmol/L — ABNORMAL LOW (ref 135–145)
Total Bilirubin: 3.3 mg/dL — ABNORMAL HIGH (ref 0.3–1.2)
Total Protein: 5.7 g/dL — ABNORMAL LOW (ref 6.5–8.1)

## 2021-12-06 LAB — CBC
HCT: 24.2 % — ABNORMAL LOW (ref 39.0–52.0)
Hemoglobin: 8.7 g/dL — ABNORMAL LOW (ref 13.0–17.0)
MCH: 30.1 pg (ref 26.0–34.0)
MCHC: 36 g/dL (ref 30.0–36.0)
MCV: 83.7 fL (ref 80.0–100.0)
Platelets: 71 10*3/uL — ABNORMAL LOW (ref 150–400)
RBC: 2.89 MIL/uL — ABNORMAL LOW (ref 4.22–5.81)
RDW: 17.3 % — ABNORMAL HIGH (ref 11.5–15.5)
WBC: 7.3 10*3/uL (ref 4.0–10.5)
nRBC: 0.4 % — ABNORMAL HIGH (ref 0.0–0.2)

## 2021-12-06 LAB — LEGIONELLA PNEUMOPHILA SEROGP 1 UR AG: L. pneumophila Serogp 1 Ur Ag: NEGATIVE

## 2021-12-06 LAB — TROPONIN I (HIGH SENSITIVITY): Troponin I (High Sensitivity): 23 ng/L — ABNORMAL HIGH (ref <18)

## 2021-12-06 LAB — PHOSPHORUS: Phosphorus: <1 mg/dL — CL (ref 2.5–4.6)

## 2021-12-06 LAB — LACTIC ACID, PLASMA: Lactic Acid, Venous: 1.4 mmol/L (ref 0.5–1.9)

## 2021-12-06 LAB — MAGNESIUM: Magnesium: 1.8 mg/dL (ref 1.7–2.4)

## 2021-12-06 MED ORDER — LOPERAMIDE HCL 2 MG PO CAPS
2.0000 mg | ORAL_CAPSULE | ORAL | Status: DC | PRN
Start: 2021-12-06 — End: 2021-12-12
  Administered 2021-12-06 – 2021-12-11 (×4): 2 mg via ORAL
  Filled 2021-12-06 (×4): qty 1

## 2021-12-06 MED ORDER — POTASSIUM CHLORIDE 10 MEQ/100ML IV SOLN
10.0000 meq | INTRAVENOUS | Status: AC
  Administered 2021-12-06 (×3): 10 meq via INTRAVENOUS
  Filled 2021-12-06 (×3): qty 100

## 2021-12-06 MED ORDER — BUDESONIDE 0.5 MG/2ML IN SUSP
0.5000 mg | Freq: Two times a day (BID) | RESPIRATORY_TRACT | Status: DC
  Administered 2021-12-06 – 2021-12-12 (×12): 0.5 mg via RESPIRATORY_TRACT
  Filled 2021-12-06 (×12): qty 2

## 2021-12-06 MED ORDER — DEXTROSE 5 % IV SOLN
30.0000 mmol | Freq: Once | INTRAVENOUS | Status: AC
Start: 2021-12-06 — End: 2021-12-06
  Administered 2021-12-06: 30 mmol via INTRAVENOUS
  Filled 2021-12-06: qty 10

## 2021-12-06 MED ORDER — POTASSIUM CHLORIDE CRYS ER 20 MEQ PO TBCR
40.0000 meq | EXTENDED_RELEASE_TABLET | ORAL | Status: AC
  Administered 2021-12-06 (×3): 40 meq via ORAL
  Filled 2021-12-06 (×3): qty 2

## 2021-12-06 MED ORDER — GUAIFENESIN 100 MG/5ML PO LIQD
5.0000 mL | Freq: Four times a day (QID) | ORAL | Status: DC | PRN
Start: 2021-12-06 — End: 2021-12-12
  Administered 2021-12-07: 5 mL via ORAL
  Filled 2021-12-06: qty 5

## 2021-12-06 MED ORDER — DEXTROSE 5 % IV SOLN
30.0000 mmol | Freq: Once | INTRAVENOUS | Status: DC
Start: 2021-12-06 — End: 2021-12-06

## 2021-12-06 MED ORDER — MAGNESIUM SULFATE 2 GM/50ML IV SOLN
2.0000 g | Freq: Once | INTRAVENOUS | Status: AC
Start: 2021-12-06 — End: 2021-12-06
  Administered 2021-12-06: 2 g via INTRAVENOUS
  Filled 2021-12-06: qty 50

## 2021-12-06 MED ORDER — LACTATED RINGERS IV SOLN: INTRAVENOUS | Status: AC

## 2021-12-06 MED ORDER — METOPROLOL TARTRATE 5 MG/5ML IV SOLN: 2.5000 mg | Freq: Three times a day (TID) | INTRAVENOUS | Status: DC | PRN

## 2021-12-06 MED ORDER — SODIUM CHLORIDE 0.9 % IV BOLUS
1000.0000 mL | Freq: Once | INTRAVENOUS | Status: AC
  Administered 2021-12-06: 1000 mL via INTRAVENOUS

## 2021-12-06 NOTE — Telephone Encounter (Signed)
Order placed

## 2021-12-06 NOTE — TOC Progression Note (Signed)
Transition of Care [facility]) - Progression Note    Patient Details  Name: Nicholas Avila MRN: [id number] Date of Birth: [birth year]  [facility]) CM/SW Contact  Salome Arnt, [place] Phone Number: 12/06/2021, 8:18 AM  Clinical Narrative:  TOC received consult for substance use. Pt reports he stopped drinking awhile ago. He states he uses marijuana but does not feel this is a problem for him and said he has no need for substance use treatment resources.        Barriers to Discharge: Continued Medical Work up  Expected Discharge Plan and Services                                                 Social Determinants of Health (SDOH) Interventions    Readmission Risk Interventions     No data to display

## 2021-12-06 NOTE — Progress Notes (Signed)
Nurse called to place patient on BIPAP to decrease hr

## 2021-12-06 NOTE — Progress Notes (Signed)
Nurse call for RT to come and evaluate patient for possible BIPAP due to increase RR.This RT notice earlier tonight patient had increased rr of 35's while sitting on side of bed fixing his drink and talked with patient he has no shortness of breath. Breathing treatment given and rr decreased to 15. Tonight patient has increased respiratory rates of 45-50 talked with patient, bs diminished, spo2 100% on 2lpm pt has no complaints of sob. Explained to patient about the benefits and probability of BIPAP and also incentive spirometer given due to shallow breathing to help increase lung volumes

## 2021-12-06 NOTE — Progress Notes (Addendum)
Placed patient on Dreamstation on Bipap mode 14/6 with 4L bled in.  Patient complained about the pressure so dropped it to 12/6 with 4L.   Patient stated that was better.

## 2021-12-06 NOTE — Progress Notes (Signed)
Progress Note   Patient: Nicholas Avila:[id number] DOB: [birth year] DOA: 12/04/2021     2 DOS: the patient was seen and examined on 12/06/2021   Brief hospital admission course: Nicholas Avila is a 61 y.o. male with medical history significant of history of alcoholism in remission, tobacco abuse, prior history of transient atrial flutter/atrial fibrillation and hypertension; who presented to the hospital secondary to diarrhea, chills general malaise.  Patient reports symptoms present for the last 2 days and worsening.  Work-up in the ED demonstrating sepsis with findings suggesting right lower lobe pneumonia.  Patient reports coughing spells, no nausea or vomiting.  No focal weakness. Patient is demonstrating tachypnea, tachycardia, elevated temperature (102.5) and complains of bloating and diarrhea.   Lactic acid elevated in the setting of sepsis.  Cultures taken, fluid resuscitation initiated and IV antibiotics started.  TRH has been contacted to place patient in the hospital for further evaluation and management.   Work-up also demonstrated transaminitis (something for why he is actively following up with GI as an outpatient), hypomagnesemia, hypokalemia, normal TSH and negative influenza/COVID PCR.    Assessment and Plan: * Severe sepsis ([place]) - Patient met criteria for sepsis at time of admission with leukopenia, tachypnea, tachycardia, elevated lactic acid, source of infection appreciated with infiltrates in his lungs (right lower lung) and having hypoxia as part of organ dysfunction. -MEWS score 5 at time of admission, currently 3-4 ([place]) -Continue current IV antibiotics -Continue mucolytic management, wean oxygen supplementation as tolerated and continue bronchodilators. -No wheezing on exam and overall demonstrating improvement in blood pressure and decreasing his heart rate. -Still tachypneic, but on exam not demonstrating labor breathing.. -Low threshold for steroid  initiation. -Continue the use of flutter valve and start incentive spirometer. -continue mucolytics. -Follow culture results and clinical response -lactic acid WNL now.  HTN (hypertension) -Blood pressure soft but stable overall with a map above 70 -Continue to follow vital signs. -Continue holding antihypertensive agents.   Hypomagnesemia - Continue to provide repletion as needed -Follow electrolytes trend. -Continue telemetry monitoring.  Hypokalemia -In the setting of GI losses -Continue to provide repletion as needed -Follow electrolytes trend.  Elevated LFTs -Follow any GI service recommendations. Planning for outpatient liver biopsy -Continue IV fluids and treatment for sepsis meanwhile. -LFTs continue trending down.  Diarrhea - Continue to maintain adequate hydration and replete electrolytes as needed -C. Diff and GI pathogen panel negative -As needed Lomotil will be added.  Alcoholism in remission [facility]) - Patient reports since 2019 -Congratulated and encouraged to keep himself alcohol free. -No withdrawal symptoms or tremor appreciated.  Tobacco abuse -Cessation counseling provided -Nicotine patch has been ordered.  Thrombocytopenia -No overt bleeding appreciated -Presume associated with underlying liver disease and prior history of longstanding alcoholism.  Sepsis most likely playing also a role in decreasing his platelets count. -Follow-up platelets count -Continue to avoid heparin products  Hypophosphatemia/hyponatremia -Will provide phosphorus repletion and follow trend -Sodium level appears to be chronically on the low end; patient asymptomatic -Continue fluid resuscitation and follow electrolytes trend  Subjective:  Chronically ill and underweight in appearance; no chest pain, no nausea or vomiting.  Continues to be short winded, experiencing intermittent coughing spells and having tachypnea and tachycardia on examination.  Patient is not  febrile.  Physical Exam: Vitals:   12/06/21 0600 12/06/21 0700 12/06/21 0710 12/06/21 0900  BP: 134/70 108/66  121/68  Pulse:    (!) 124  Resp:  14  (!) 25  Temp:  98.6 F (37 C)   TempSrc:   Oral   SpO2:    92%  Weight:      Height:       General exam: Alert, awake, oriented x 3; reports feeling slightly better; no chest pain, no nausea, no vomiting.  Still short winded with activity and overnight demonstrating significant tachypnea process and tachycardia. Respiratory system: Positive scattered rhonchi; no wheezing; positive tachypnea, using accessory muscle.  Shallow breathing appreciated on examination. Cardiovascular system: Sinus tachycardia, no rubs, no gallops, no JVD. Gastrointestinal system: Abdomen is nondistended, soft and nontender. No organomegaly or masses felt. Normal bowel sounds heard. Central nervous system: Alert and oriented. No focal neurological deficits. Extremities: No cyanosis or clubbing. Skin: No petechiae. Psychiatry: Judgement and insight appear normal. Mood & affect appropriate.   Data Reviewed: Comprehensive metabolic panel: Sodium 138, potassium 2.5, chloride 101, bicarb 26, BUN 5, creatinine 0.33, AST 50, ALT 36, alk phos 163, total bilirubin 3.3; anion gap 7 CBC: WBC 7.3, hemoglobin 9.7, platelet count 71 K   Family Communication: Son and daughter at bedside.  Disposition: Status is: Inpatient Remains inpatient appropriate because: Still requiring IV antibiotics for ongoing sepsis in the setting of community-acquired pneumonia.   Planned Discharge Destination: Home   Author: Barton Dubois, MD 12/06/2021 9:24 AM  For on call review [email or web address].

## 2021-12-06 NOTE — Addendum Note (Signed)
Addended by: Cheron Every on: 12/06/2021 07:19 AM   Modules accepted: Orders

## 2021-12-07 DIAGNOSIS — R6520 Severe sepsis without septic shock: Secondary | ICD-10-CM | POA: Diagnosis not present

## 2021-12-07 DIAGNOSIS — A419 Sepsis, unspecified organism: Secondary | ICD-10-CM | POA: Diagnosis not present

## 2021-12-07 LAB — CBC
HCT: 22.6 % — ABNORMAL LOW (ref 39.0–52.0)
Hemoglobin: 8 g/dL — ABNORMAL LOW (ref 13.0–17.0)
MCH: 30 pg (ref 26.0–34.0)
MCHC: 35.4 g/dL (ref 30.0–36.0)
MCV: 84.6 fL (ref 80.0–100.0)
Platelets: 112 10*3/uL — ABNORMAL LOW (ref 150–400)
RBC: 2.67 MIL/uL — ABNORMAL LOW (ref 4.22–5.81)
RDW: 17.7 % — ABNORMAL HIGH (ref 11.5–15.5)
WBC: 15.8 10*3/uL — ABNORMAL HIGH (ref 4.0–10.5)
nRBC: 0.4 % — ABNORMAL HIGH (ref 0.0–0.2)

## 2021-12-07 LAB — BASIC METABOLIC PANEL
Anion gap: 8 (ref 5–15)
BUN: 6 mg/dL (ref 6–20)
CO2: 25 mmol/L (ref 22–32)
Calcium: 8.5 mg/dL — ABNORMAL LOW (ref 8.9–10.3)
Chloride: 102 mmol/L (ref 98–111)
Creatinine, Ser: 0.45 mg/dL — ABNORMAL LOW (ref 0.61–1.24)
GFR, Estimated: >60 mL/min (ref >60)
Glucose, Bld: 74 mg/dL (ref 70–99)
Potassium: 3.4 mmol/L — ABNORMAL LOW (ref 3.5–5.1)
Sodium: 135 mmol/L (ref 135–145)

## 2021-12-07 LAB — PHOSPHORUS: Phosphorus: 2.6 mg/dL (ref 2.5–4.6)

## 2021-12-07 LAB — PROCALCITONIN: Procalcitonin: 3.93 ng/mL

## 2021-12-07 MED ORDER — LORAZEPAM 1 MG PO TABS: 1.0000 mg | ORAL_TABLET | ORAL | Status: AC | PRN

## 2021-12-07 MED ORDER — THIAMINE HCL 100 MG PO TABS
100.0000 mg | ORAL_TABLET | Freq: Every day | ORAL | Status: DC
  Administered 2021-12-08 – 2021-12-12 (×5): 100 mg via ORAL
  Filled 2021-12-07 (×5): qty 1

## 2021-12-07 MED ORDER — FOLIC ACID 1 MG PO TABS
1.0000 mg | ORAL_TABLET | Freq: Every day | ORAL | Status: DC
  Administered 2021-12-08 – 2021-12-12 (×5): 1 mg via ORAL
  Filled 2021-12-07 (×5): qty 1

## 2021-12-07 MED ORDER — POTASSIUM CHLORIDE CRYS ER 20 MEQ PO TBCR
40.0000 meq | EXTENDED_RELEASE_TABLET | Freq: Two times a day (BID) | ORAL | Status: AC
Start: 2021-12-07 — End: 2021-12-07
  Administered 2021-12-07 (×2): 40 meq via ORAL
  Filled 2021-12-07: qty 2

## 2021-12-07 MED ORDER — THIAMINE HCL 100 MG/ML IJ SOLN
100.0000 mg | Freq: Every day | INTRAMUSCULAR | Status: DC
Start: 2021-12-07 — End: 2021-12-12

## 2021-12-07 MED ORDER — ADULT MULTIVITAMIN W/MINERALS CH
1.0000 | ORAL_TABLET | Freq: Every day | ORAL | Status: DC
  Administered 2021-12-08 – 2021-12-12 (×5): 1 via ORAL
  Filled 2021-12-07 (×5): qty 1

## 2021-12-07 MED ORDER — LORAZEPAM 2 MG/ML IJ SOLN: 1.0000 mg | INTRAMUSCULAR | Status: AC | PRN

## 2021-12-07 NOTE — Progress Notes (Signed)
PT Cancellation Note  Patient Details Name: AAIDEN DEPOY MRN: [id number] DOB: 1960-10-29   Cancelled Treatment:    Reason Eval/Treat Not Completed: PT screened, no needs identified, will sign off. Patient reports he has been walking to bathroom and room door ad lib without concerns. PT consulted with nursing - no concerns. Thank you for the referral.   Floria Raveling. Hartnett-Rands, MS, PT [facility] [place] #[id number]  12/07/2021, 12:32 PM

## 2021-12-07 NOTE — Progress Notes (Signed)
Lab came to me and told me patient did not want their blood drawn by them. I went and talked to the patient and he thinks that the lab technician is a lady named Artist. I asked patient all of the orientation questions and he answered appropriately. The patient's family member/friend was in the room and told the patient that the lady was not Hoyle Sauer and he agreed. Patient states she looked like her. The lab tech is getting someone else to draw patient's labs. MD Manuella Ghazi notifed.

## 2021-12-07 NOTE — Progress Notes (Signed)
Came into room to give patient breathing treatment, sats were in middle 80s.  Patient had [place] in nares but it didn't seem to be on.  Put patient back on 3L and sat came up to 91%.

## 2021-12-07 NOTE — Progress Notes (Signed)
Pt stated he could not tolerate BiPap and removed it. Pt placed back on 2 L nasal cannula.Respiratory informed.

## 2021-12-07 NOTE — Progress Notes (Signed)
Nicholas NOTE    LOGEN Avila  VQQ:[id number] DOB: 19-Nov-1960 DOA: 12/04/2021 PCP: [name], MD   Brief Narrative:  MAT STUARD is a 61 y.o. male with medical history significant of history of alcoholism in remission, tobacco abuse, prior history of transient atrial flutter/atrial fibrillation and hypertension; who presented to the hospital secondary to diarrhea, chills general malaise.  He was admitted for severe sepsis, present on admission secondary to right lower lobe pneumonia.  He continues to have some ongoing tachycardia.  Assessment & Plan:   Principal Problem:   Severe sepsis ([place]) Active Problems:   Tobacco abuse   Alcoholism in remission ([place])   Diarrhea   Elevated LFTs   Hypokalemia   Hypomagnesemia   HTN (hypertension)  Assessment and Plan:   Severe sepsis ([place]) - Patient met criteria for sepsis at time of admission with leukopenia, tachypnea, tachycardia, elevated lactic acid, source of infection appreciated with infiltrates in his lungs (right lower lung) and having hypoxia as part of organ dysfunction. -MEWS score 5 at time of admission, currently 3-4 ([place]) -Continue current IV antibiotics -Continue mucolytic management, wean oxygen supplementation as tolerated and continue bronchodilators. -No wheezing on exam and overall demonstrating improvement in blood pressure and decreasing his heart rate. -Still tachypneic, but on exam not demonstrating labor breathing.. -Low threshold for steroid initiation. -Continue the use of flutter valve and start incentive spirometer. -continue mucolytics. -Follow culture results and clinical response -lactic acid WNL now. -Check procalcitonin   HTN (hypertension) -Blood pressure soft but stable overall with a map above 70 -Continue to follow vital signs. -Continue holding antihypertensive agents.     Hypomagnesemia - Continue to provide repletion as needed -Follow electrolytes  trend. -Continue telemetry monitoring.   Hypokalemia -In the setting of GI losses -Continue to provide repletion as needed -Follow electrolytes trend.   Elevated LFTs -Follow any GI service recommendations. Planning for outpatient liver biopsy -Continue IV fluids and treatment for sepsis meanwhile. -LFTs continue trending down.   Diarrhea - Continue to maintain adequate hydration and replete electrolytes as needed -C. Diff and GI pathogen panel negative -As needed Lomotil will be added.   Alcoholism in remission [facility]) - Patient reports since 2019 -Congratulated and encouraged to keep himself alcohol free. -No withdrawal symptoms or tremor appreciated. -Started on CIWA protocol given ongoing tachycardia   Tobacco abuse -Cessation counseling provided -Nicotine patch has been ordered.   Thrombocytopenia -No overt bleeding appreciated -Presume associated with underlying liver disease and prior history of longstanding alcoholism.  Sepsis most likely playing also a role in decreasing his platelets count. -Follow-up platelets count -Continue to avoid heparin products    DVT prophylaxis: SCDs Code Status: Full Family Communication:  Disposition Plan:  Status is: Inpatient Remains inpatient appropriate because: IV medications   Consultants:  None  Procedures:  None  Antimicrobials:  Anti-infectives (From admission, onward)    Start     Dose/Rate Route Frequency Ordered Stop   12/04/21 1800  cefTRIAXone (ROCEPHIN) 2 g in sodium chloride 0.9 % 100 mL IVPB        2 g 200 mL/hr over 30 Minutes Intravenous Every 24 hours 12/04/21 1245 12/09/21 1759   12/04/21 1300  azithromycin (ZITHROMAX) 500 mg in sodium chloride 0.9 % 250 mL IVPB        500 mg 250 mL/hr over 60 Minutes Intravenous Every 24 hours 12/04/21 1245 12/09/21 1244   12/04/21 1000  ceFEPIme (MAXIPIME) 2 g in sodium chloride 0.9 % 100  mL IVPB        2 g 200 mL/hr over 30 Minutes Intravenous  Once 12/04/21 0955  12/04/21 1055   12/04/21 1000  vancomycin (VANCOCIN) IVPB 1000 mg/200 mL premix        1,000 mg 200 mL/hr over 60 Minutes Intravenous  Once 12/04/21 0955 12/04/21 1158   12/04/21 0945  metroNIDAZOLE (FLAGYL) IVPB 500 mg        500 mg 100 mL/hr over 60 Minutes Intravenous  Once 12/04/21 0941 12/04/21 1055      Subjective: Patient seen and evaluated today with no new acute complaints or concerns. No acute concerns or events noted overnight.  Objective: Vitals:   12/07/21 0535 12/07/21 0800 12/07/21 0807 12/07/21 0900  BP:    128/87  Pulse:    (!) 114  Resp:    20  Temp:    98.3 F (36.8 C)  TempSrc:    Oral  SpO2:  97% 97% 100%  Weight: 55.7 kg     Height:        Intake/Output Summary (Last 24 hours) at 12/07/2021 1123 Last data filed at 12/07/2021 0423 Gross per 24 hour  Intake 2180.23 ml  Output 1225 ml  Net 955.23 ml   Filed Weights   12/04/21 1726 12/05/21 0501 12/07/21 0535  Weight: 52.2 kg 53.7 kg 55.7 kg    Examination:  General exam: Appears calm and comfortable  Respiratory system: Clear to auscultation. Respiratory effort normal.  Currently on 2 L nasal cannula Cardiovascular system: S1 & S2 heard, RRR.  Gastrointestinal system: Abdomen is soft Central nervous system: Alert and awake Extremities: No edema Skin: No significant lesions noted Psychiatry: Flat affect.    Data Reviewed: I have personally reviewed following labs and imaging studies  CBC: Recent Labs  Lab 12/04/21 0944 12/05/21 0418 12/06/21 0414 12/07/21 0527  WBC 1.3* 2.1* 7.3 15.8*  NEUTROABS 0.5*  --   --   --   HGB 9.8* 8.1* 8.7* 8.0*  HCT 27.7* 23.5* 24.2* 22.6*  MCV 83.9 85.1 83.7 84.6  PLT 82* 73* 71* 462*   Basic Metabolic Panel: Recent Labs  Lab 12/04/21 0944 12/04/21 1056 12/05/21 0418 12/06/21 0414 12/07/21 0527  NA 130*  --  135 134* 135  K 2.7*  --  2.4* 2.5* 3.4*  CL 95*  --  104 101 102  CO2 25  --  '26 26 25  ' GLUCOSE 81  --  101* 108* 74  BUN 8  --  6 5*  6  CREATININE 0.65  --  0.43* 0.33* 0.45*  CALCIUM 7.7*  --  7.6* 8.2* 8.5*  MG  --  1.3* 1.7 1.8  --   PHOS  --   --  2.1* <1.0* 2.6   GFR: Estimated Creatinine Clearance: 77.4 mL/min (A) (by C-G formula based on SCr of 0.45 mg/dL (L)). Liver Function Tests: Recent Labs  Lab 12/02/21 0826 12/04/21 0944 12/05/21 0418 12/06/21 0414  AST 187* 108* 65* 50*  ALT 82* 58* 38 36  ALKPHOS  --  185* 139* 163*  BILITOT 1.9* 4.5* 3.2* 3.3*  PROT 7.1 6.6 5.2* 5.7*  ALBUMIN  --  2.4* 1.8* 1.9*   No results for input(s): "LIPASE", "AMYLASE" in the last 168 hours. No results for input(s): "AMMONIA" in the last 168 hours. Coagulation Profile: Recent Labs  Lab 12/04/21 0944  INR 1.4*   Cardiac Enzymes: No results for input(s): "CKTOTAL", "CKMB", "CKMBINDEX", "TROPONINI" in the last 168 hours. BNP (last  3 results) No results for input(s): "PROBNP" in the last 8760 hours. HbA1C: No results for input(s): "HGBA1C" in the last 72 hours. CBG: No results for input(s): "GLUCAP" in the last 168 hours. Lipid Profile: No results for input(s): "CHOL", "HDL", "LDLCALC", "TRIG", "CHOLHDL", "LDLDIRECT" in the last 72 hours. Thyroid Function Tests: No results for input(s): "TSH", "T4TOTAL", "FREET4", "T3FREE", "THYROIDAB" in the last 72 hours. Anemia Panel: No results for input(s): "VITAMINB12", "FOLATE", "FERRITIN", "TIBC", "IRON", "RETICCTPCT" in the last 72 hours. Sepsis Labs: Recent Labs  Lab 12/04/21 0944 12/04/21 1134 12/05/21 2154 12/06/21 0414  LATICACIDVEN 2.9* 3.2* 4.2* 1.4    Recent Results (from the past 240 hour(s))  C Difficile Quick Screen w PCR reflex     Status: None   Collection Time: 12/04/21  9:25 AM   Specimen: STOOL  Result Value Ref Range Status   C Diff antigen NEGATIVE NEGATIVE Final   C Diff toxin NEGATIVE NEGATIVE Final   C Diff interpretation No C. difficile detected.  Final    Comment: Performed at [facility], [address] [place], [place] [address]   Resp Panel by RT-PCR (Flu A&B, Covid) Anterior Nasal Swab     Status: None   Collection Time: 12/04/21  9:37 AM   Specimen: Anterior Nasal Swab  Result Value Ref Range Status   SARS Coronavirus 2 by RT PCR NEGATIVE NEGATIVE Final    Comment: (NOTE) SARS-CoV-2 target nucleic acids are NOT DETECTED.  The SARS-CoV-2 RNA is generally detectable in upper respiratory specimens during the acute phase of infection. The lowest concentration of SARS-CoV-2 viral copies this assay can detect is 138 copies/mL. A negative result does not preclude SARS-Cov-2 infection and should not be used as the sole basis for treatment or other patient management decisions. A negative result may occur with  improper specimen collection/handling, submission of specimen other than nasopharyngeal swab, presence of viral mutation(s) within the areas targeted by this assay, and inadequate number of viral copies(<138 copies/mL). A negative result must be combined with clinical observations, patient history, and epidemiological information. The expected result is Negative.  Fact Sheet for Patients:  EntrepreneurPulse.com.au  Fact Sheet for Healthcare Providers:  IncredibleEmployment.be  This test is no t yet approved or cleared by the [place] FDA and  has been authorized for detection and/or diagnosis of SARS-CoV-2 by FDA under an Emergency Use Authorization (EUA). This EUA will remain  in effect (meaning this test can be used) for the duration of the COVID-19 declaration under Section 564(b)(1) of the Act, 21 U.S.C.section 360bbb-3(b)(1), unless the authorization is terminated  or revoked sooner.       Influenza A by PCR NEGATIVE NEGATIVE Final   Influenza B by PCR NEGATIVE NEGATIVE Final    Comment: (NOTE) The Xpert Xpress SARS-CoV-2/FLU/RSV plus assay is intended as an aid in the diagnosis of influenza from Nasopharyngeal swab specimens and should not be used as a  sole basis for treatment. Nasal washings and aspirates are unacceptable for Xpert Xpress SARS-CoV-2/FLU/RSV testing.  Fact Sheet for Patients: EntrepreneurPulse.com.au  Fact Sheet for Healthcare Providers: IncredibleEmployment.be  This test is not yet approved or cleared by the [place] FDA and has been authorized for detection and/or diagnosis of SARS-CoV-2 by FDA under an Emergency Use Authorization (EUA). This EUA will remain in effect (meaning this test can be used) for the duration of the COVID-19 declaration under Section 564(b)(1) of the Act, 21 U.S.C. section 360bbb-3(b)(1), unless the authorization is terminated or revoked.  Performed at  [facility], [address] [place], [place] [address]   Culture, blood (Routine x 2)     Status: None (Preliminary result)   Collection Time: 12/04/21  9:42 AM   Specimen: Left Antecubital; Blood  Result Value Ref Range Status   Specimen Description   Final    LEFT ANTECUBITAL BLOOD Performed at [facility] [address] [place], [place] [address]    Special Requests   Final    Blood Culture results may not be optimal due to an excessive volume of blood received in culture bottles BOTTLES DRAWN AEROBIC AND ANAEROBIC Performed at [facility], [place] [address], [place], [place] [address]    Culture   Final    NO GROWTH 3 DAYS Performed at [facility], [address] [place], [place] [address]    Report Status PENDING  Incomplete  Culture, blood (Routine x 2)     Status: None (Preliminary result)   Collection Time: 12/04/21  9:43 AM   Specimen: Right Antecubital; Blood  Result Value Ref Range Status   Specimen Description   Final    RIGHT ANTECUBITAL BLOOD Performed at [facility], [place] [address], [place], [place] [address]    Special Requests   Final    Blood Culture adequate volume BOTTLES DRAWN AEROBIC AND ANAEROBIC Performed at [facility], [place]  [address], [place], [place] [address]    Culture   Final    NO GROWTH 3 DAYS Performed at [facility], [address], [place], [place] [address]    Report Status PENDING  Incomplete  Urine Culture     Status: None   Collection Time: 12/04/21 11:23 AM   Specimen: In/Out Cath Urine  Result Value Ref Range Status   Specimen Description   Final    IN/OUT CATH URINE Performed at [facility], [address] [place], [place] [address]    Special Requests   Final    NONE Performed at [facility], [address] [place], [place] [address]    Culture   Final    NO GROWTH Performed at [facility], [place] [address] [place], [place] [address]    Report Status 12/05/2021 FINAL  Final  Gastrointestinal Panel by PCR , Stool     Status: None   Collection Time: 12/04/21  5:00 PM   Specimen: Stool  Result Value Ref Range Status   Campylobacter species NOT DETECTED NOT DETECTED Final   Plesimonas shigelloides NOT DETECTED NOT DETECTED Final   Salmonella species NOT DETECTED NOT DETECTED Final   Yersinia enterocolitica NOT DETECTED NOT DETECTED Final   Vibrio species NOT DETECTED NOT DETECTED Final   Vibrio cholerae NOT DETECTED NOT DETECTED Final   Enteroaggregative E coli (EAEC) NOT DETECTED NOT DETECTED Final   Enteropathogenic E coli (EPEC) NOT DETECTED NOT DETECTED Final   Enterotoxigenic E coli (ETEC) NOT DETECTED NOT DETECTED Final   Shiga like toxin producing E coli (STEC) NOT DETECTED NOT DETECTED Final   Shigella/Enteroinvasive E coli (EIEC) NOT DETECTED NOT DETECTED Final   Cryptosporidium NOT DETECTED NOT DETECTED Final   Cyclospora cayetanensis NOT DETECTED NOT DETECTED Final   Entamoeba histolytica NOT DETECTED NOT DETECTED Final   Giardia lamblia NOT DETECTED NOT DETECTED Final   Adenovirus F40/41 NOT DETECTED NOT DETECTED Final   Astrovirus NOT DETECTED NOT DETECTED Final   Norovirus GI/GII NOT DETECTED NOT DETECTED Final   Rotavirus A NOT DETECTED NOT DETECTED Final  Sapovirus (I, II, IV, and V) NOT DETECTED NOT DETECTED Final    Comment: Performed at [facility], [address] [place], [place] [address]  MRSA Next Gen by PCR, Nasal     Status: None   Collection Time: 12/04/21  6:02 PM   Specimen: STOOL; Nasal Swab  Result Value Ref Range Status   MRSA by PCR Next Gen NOT DETECTED NOT DETECTED Final    Comment: (NOTE) The GeneXpert MRSA Assay (FDA approved for NASAL specimens only), is one component of a comprehensive MRSA colonization surveillance program. It is not intended to diagnose MRSA infection nor to guide or monitor treatment for MRSA infections. Test performance is not FDA approved in patients less than 54 years old. Performed at [facility], [address], [place], [place] [address]          Radiology Studies: No results found.      Scheduled Meds:  budesonide (PULMICORT) nebulizer solution  0.5 mg Nebulization BID   Chlorhexidine Gluconate Cloth  6 each Topical Daily   dextromethorphan-guaiFENesin  1 tablet Oral BID   folic acid  1 mg Oral Daily   folic acid  1 mg Oral Daily   ipratropium-albuterol  3 mL Nebulization TID   multivitamin with minerals  1 tablet Oral Daily   multivitamin with minerals  1 tablet Oral Daily   nicotine  14 mg Transdermal Daily   potassium chloride  40 mEq Oral BID   thiamine  100 mg Oral Daily   Or   thiamine  100 mg Intravenous Daily   thiamine  100 mg Oral Daily   Or   thiamine  100 mg Intravenous Daily   Continuous Infusions:  azithromycin Stopped (12/06/21 1421)   cefTRIAXone (ROCEPHIN)  IV Stopped (12/06/21 1810)     LOS: 3 days    Time spent: 35 minutes    Jeanpierre Thebeau Darleen Crocker, DO [facility]  If 7PM-7AM, please contact night-coverage [email or web address] 12/07/2021, 11:23 AM

## 2021-12-08 ENCOUNTER — Inpatient Hospital Stay (HOSPITAL_COMMUNITY): Payer: BC Managed Care – PPO

## 2021-12-08 DIAGNOSIS — R6520 Severe sepsis without septic shock: Secondary | ICD-10-CM | POA: Diagnosis not present

## 2021-12-08 DIAGNOSIS — A419 Sepsis, unspecified organism: Secondary | ICD-10-CM | POA: Diagnosis not present

## 2021-12-08 LAB — CBC
HCT: 21.6 % — ABNORMAL LOW (ref 39.0–52.0)
Hemoglobin: 7.8 g/dL — ABNORMAL LOW (ref 13.0–17.0)
MCH: 30.1 pg (ref 26.0–34.0)
MCHC: 36.1 g/dL — ABNORMAL HIGH (ref 30.0–36.0)
MCV: 83.4 fL (ref 80.0–100.0)
Platelets: 177 10*3/uL (ref 150–400)
RBC: 2.59 MIL/uL — ABNORMAL LOW (ref 4.22–5.81)
RDW: 17.8 % — ABNORMAL HIGH (ref 11.5–15.5)
WBC: 20.6 10*3/uL — ABNORMAL HIGH (ref 4.0–10.5)
nRBC: 0.5 % — ABNORMAL HIGH (ref 0.0–0.2)

## 2021-12-08 LAB — BASIC METABOLIC PANEL
Anion gap: 7 (ref 5–15)
BUN: 7 mg/dL (ref 6–20)
CO2: 27 mmol/L (ref 22–32)
Calcium: 8.2 mg/dL — ABNORMAL LOW (ref 8.9–10.3)
Chloride: 101 mmol/L (ref 98–111)
Creatinine, Ser: 0.48 mg/dL — ABNORMAL LOW (ref 0.61–1.24)
GFR, Estimated: >60 mL/min (ref >60)
Glucose, Bld: 94 mg/dL (ref 70–99)
Potassium: 3 mmol/L — ABNORMAL LOW (ref 3.5–5.1)
Sodium: 135 mmol/L (ref 135–145)

## 2021-12-08 LAB — MAGNESIUM: Magnesium: 1.5 mg/dL — ABNORMAL LOW (ref 1.7–2.4)

## 2021-12-08 LAB — PROCALCITONIN: Procalcitonin: 3.38 ng/mL

## 2021-12-08 MED ORDER — MAGNESIUM SULFATE 2 GM/50ML IV SOLN
2.0000 g | Freq: Once | INTRAVENOUS | Status: AC
  Administered 2021-12-08: 2 g via INTRAVENOUS
  Filled 2021-12-08: qty 50

## 2021-12-08 MED ORDER — POTASSIUM CHLORIDE CRYS ER 20 MEQ PO TBCR
40.0000 meq | EXTENDED_RELEASE_TABLET | Freq: Once | ORAL | Status: AC
Start: 2021-12-08 — End: 2021-12-08
  Administered 2021-12-08: 40 meq via ORAL
  Filled 2021-12-08: qty 2

## 2021-12-08 MED ORDER — POTASSIUM CHLORIDE 10 MEQ/100ML IV SOLN
10.0000 meq | INTRAVENOUS | Status: AC
  Administered 2021-12-08 (×4): 10 meq via INTRAVENOUS
  Filled 2021-12-08 (×4): qty 100

## 2021-12-08 NOTE — Progress Notes (Signed)
Pt previously placed on BiPAP. Pt unable to tolerate BiPAp previous night. Pt has been ST and intermittently tachypneic with any exertion during the day while on 2-3L Protivin.Marland Kitchen Pt denies feeling SOB. Pt lung sounds more diminished as compared to previous night and pt breathing more shallow on BiPap. Pt sleeping during RR of 38-44. Contacted Dr. Josephine Cables who recommended continued monitoring for any further changes. Pt HR is slightly lower than previously while on [place].

## 2021-12-08 NOTE — Progress Notes (Signed)
   12/08/21 1015  Assess: MEWS Score  Temp 100.3 F (37.9 C)  BP 116/68  MAP (mmHg) 83  Pulse Rate (!) 123  Resp 18  Level of Consciousness Alert  SpO2 100 %  O2 Device Nasal Cannula  O2 Flow Rate (L/min) 2 L/min  Assess: MEWS Score  MEWS Temp 0  MEWS Systolic 0  MEWS Pulse 2  MEWS RR 0  MEWS LOC 0  MEWS Score 2  MEWS Score Color Yellow  Assess: if the MEWS score is Yellow or Red  Were vital signs taken at a resting state? Yes  Focused Assessment No change from prior assessment  Does the patient meet 2 or more of the SIRS criteria? No  Does the patient have a confirmed or suspected source of infection? Yes  Provider and Rapid Response Notified? Yes  MEWS guidelines implemented *See Row Information* Yes  Treat  Pain Scale 0-10  Pain Score 0  Take Vital Signs  Increase Vital Sign Frequency  Yellow: Q 2hr X 2 then Q 4hr X 2, if remains yellow, continue Q 4hrs  Escalate  MEWS: Escalate Yellow: discuss with charge nurse/RN and consider discussing with provider and RRT  Notify: Charge Nurse/RN  Name of Charge Nurse/RN Notified angel, rn  Date Charge Nurse/RN Notified 12/08/21  Time Charge Nurse/RN Notified 1016  Notify: Provider  Provider Name/Title Manuella Ghazi  Date Provider Notified 12/08/21  Time Provider Notified 1018  Method of Notification Call  Notification Reason Other (Comment) (yellow mews)  Provider response No new orders  Date of Provider Response 12/08/21  Time of Provider Response 1018  Document  Patient Outcome Not stable and remains on department  Progress note created (see row info) Yes  Assess: SIRS CRITERIA  SIRS Temperature  0  SIRS Pulse 1  SIRS Respirations  0  SIRS WBC 1  SIRS Score Sum  2

## 2021-12-08 NOTE — Plan of Care (Signed)
  Problem: Respiratory: Goal: Ability to maintain adequate ventilation will improve Outcome: Progressing Goal: Ability to maintain a clear airway will improve Outcome: Progressing   Problem: Activity: Goal: Ability to tolerate increased activity will improve Outcome: Progressing   Problem: Education: Goal: Knowledge of General Education information will improve Description: Including pain rating scale, medication(s)/side effects and non-pharmacologic comfort measures Outcome: Progressing   Problem: Activity: Goal: Risk for activity intolerance will decrease Outcome: Progressing   Problem: Nutrition: Goal: Adequate nutrition will be maintained Outcome: Progressing   Problem: Coping: Goal: Level of anxiety will decrease Outcome: Progressing   Problem: Elimination: Goal: Will not experience complications related to bowel motility Outcome: Progressing Goal: Will not experience complications related to urinary retention Outcome: Progressing   Problem: Pain Managment: Goal: General experience of comfort will improve Outcome: Progressing   Problem: Safety: Goal: Ability to remain free from injury will improve Outcome: Progressing   Problem: Skin Integrity: Goal: Risk for impaired skin integrity will decrease Outcome: Progressing

## 2021-12-08 NOTE — Progress Notes (Addendum)
PROGRESS NOTE    Nicholas Avila  JOA:[id number] DOB: 1960-08-31 DOA: 12/04/2021 PCP: [name], MD   Brief Narrative:    Nicholas Avila is a 61 y.o. male with medical history significant of history of alcoholism in remission, tobacco abuse, prior history of transient atrial flutter/atrial fibrillation and hypertension; who presented to the hospital secondary to diarrhea, chills general malaise.  He was admitted for severe sepsis, present on admission secondary to right lower lobe pneumonia.  He continues to have some ongoing tachycardia.  CT chest performed 7/27 showing right lower and middle lobe infiltrates.  He continues to have some worsening leukocytosis.  Assessment & Plan:   Principal Problem:   Severe sepsis ([place]) Active Problems:   Tobacco abuse   Alcoholism in remission ([place])   Diarrhea   Elevated LFTs   Hypokalemia   Hypomagnesemia   HTN (hypertension)  Assessment and Plan:   Severe sepsis ([place]) - Patient met criteria for sepsis at time of admission with leukopenia, tachypnea, tachycardia, elevated lactic acid, source of infection appreciated with infiltrates in his lungs (right lower lung) and having hypoxia as part of organ dysfunction. -MEWS score 5 at time of admission, currently 3-4 ([place]) -Continue current IV antibiotics -Continue mucolytic management, wean oxygen supplementation as tolerated and continue bronchodilators. -No wheezing on exam and overall demonstrating improvement in blood pressure and decreasing his heart rate. -Still tachypneic, but on exam not demonstrating labor breathing.. -Low threshold for steroid initiation. -Continue the use of flutter valve and start incentive spirometer. -continue mucolytics. -Follow culture results and clinical response -lactic acid WNL now. -Procalcitonin remains elevated -CT chest with findings of right lower and middle lobe pneumonia with worsening leukocytosis -Urine analysis positive for  strep pneumonia   HTN (hypertension) -Blood pressure soft but stable overall with a map above 70 -Continue to follow vital signs. -Continue holding antihypertensive agents.     Hypomagnesemia - Continue to provide repletion as needed -Follow electrolytes trend. -Continue telemetry monitoring.   Hypokalemia -In the setting of GI losses -Continue to provide repletion as needed -Follow electrolytes trend.   Elevated LFTs -Follow any GI service recommendations. Planning for outpatient liver biopsy -Continue IV fluids and treatment for sepsis meanwhile. -LFTs continue trending down.   Diarrhea - Continue to maintain adequate hydration and replete electrolytes as needed -C. Diff and GI pathogen panel negative -As needed Lomotil will be added.   Alcoholism in remission [facility]) - Patient reports since 2019 -Congratulated and encouraged to keep himself alcohol free. -No withdrawal symptoms or tremor appreciated. -Started on CIWA protocol given ongoing tachycardia   Tobacco abuse -Cessation counseling provided -Nicotine patch has been ordered.   Thrombocytopenia -No overt bleeding appreciated -Presume associated with underlying liver disease and prior history of longstanding alcoholism.  Sepsis most likely playing also a role in decreasing his platelets count. -Follow-up platelets count -Continue to avoid heparin products     DVT prophylaxis: SCDs Code Status: Full Family Communication: None at bedside Disposition Plan:  Status is: Inpatient Remains inpatient appropriate because: IV medications     Consultants:  None   Procedures:  None   Antimicrobials:  Anti-infectives (From admission, onward)    Start     Dose/Rate Route Frequency Ordered Stop   12/04/21 1800  cefTRIAXone (ROCEPHIN) 2 g in sodium chloride 0.9 % 100 mL IVPB        2 g 200 mL/hr over 30 Minutes Intravenous Every 24 hours 12/04/21 1245 12/09/21 1759   12/04/21 1300  azithromycin (ZITHROMAX) 500  mg in sodium chloride 0.9 % 250 mL IVPB        500 mg 250 mL/hr over 60 Minutes Intravenous Every 24 hours 12/04/21 1245 12/09/21 1244   12/04/21 1000  ceFEPIme (MAXIPIME) 2 g in sodium chloride 0.9 % 100 mL IVPB        2 g 200 mL/hr over 30 Minutes Intravenous  Once 12/04/21 0955 12/04/21 1055   12/04/21 1000  vancomycin (VANCOCIN) IVPB 1000 mg/200 mL premix        1,000 mg 200 mL/hr over 60 Minutes Intravenous  Once 12/04/21 0955 12/04/21 1158   12/04/21 0945  metroNIDAZOLE (FLAGYL) IVPB 500 mg        500 mg 100 mL/hr over 60 Minutes Intravenous  Once 12/04/21 0941 12/04/21 1055       Subjective: Patient seen and evaluated today with no new acute complaints or concerns. No acute concerns or events noted overnight.  He is eager to go home, but continues to have cough and some shortness of breath.  Objective: Vitals:   12/08/21 0809 12/08/21 1015 12/08/21 1214 12/08/21 1236  BP:  116/68 100/61 102/65  Pulse:  (!) 123 98 (!) 106  Resp:  '18 18 18  ' Temp:  100.3 F (37.9 C) 98.6 F (37 C) 99.2 F (37.3 C)  TempSrc:  Axillary  Oral  SpO2: 94% 100% 99% 99%  Weight:      Height:        Intake/Output Summary (Last 24 hours) at 12/08/2021 1315 Last data filed at 12/08/2021 0900 Gross per 24 hour  Intake 530 ml  Output --  Net 530 ml   Filed Weights   12/05/21 0501 12/07/21 0535 12/08/21 0500  Weight: 53.7 kg 55.7 kg 54.7 kg    Examination:  General exam: Appears calm and comfortable  Respiratory system: Clear to auscultation. Respiratory effort normal.  3 L nasal cannula Cardiovascular system: S1 & S2 heard, RRR.  Gastrointestinal system: Abdomen is soft Central nervous system: Alert and awake Extremities: No edema Skin: No significant lesions noted Psychiatry: Flat affect.    Data Reviewed: I have personally reviewed following labs and imaging studies  CBC: Recent Labs  Lab 12/04/21 0944 12/05/21 0418 12/06/21 0414 12/07/21 0527 12/08/21 0521  WBC 1.3* 2.1*  7.3 15.8* 20.6*  NEUTROABS 0.5*  --   --   --   --   HGB 9.8* 8.1* 8.7* 8.0* 7.8*  HCT 27.7* 23.5* 24.2* 22.6* 21.6*  MCV 83.9 85.1 83.7 84.6 83.4  PLT 82* 73* 71* 112* 845   Basic Metabolic Panel: Recent Labs  Lab 12/04/21 0944 12/04/21 1056 12/05/21 0418 12/06/21 0414 12/07/21 0527 12/08/21 0521  NA 130*  --  135 134* 135 135  K 2.7*  --  2.4* 2.5* 3.4* 3.0*  CL 95*  --  104 101 102 101  CO2 25  --  '26 26 25 27  ' GLUCOSE 81  --  101* 108* 74 94  BUN 8  --  6 5* 6 7  CREATININE 0.65  --  0.43* 0.33* 0.45* 0.48*  CALCIUM 7.7*  --  7.6* 8.2* 8.5* 8.2*  MG  --  1.3* 1.7 1.8  --  1.5*  PHOS  --   --  2.1* <1.0* 2.6  --    GFR: Estimated Creatinine Clearance: 76 mL/min (A) (by C-G formula based on SCr of 0.48 mg/dL (L)). Liver Function Tests: Recent Labs  Lab 12/02/21 0826 12/04/21 0944 12/05/21 0418 12/06/21  0414  AST 187* 108* 65* 50*  ALT 82* 58* 38 36  ALKPHOS  --  185* 139* 163*  BILITOT 1.9* 4.5* 3.2* 3.3*  PROT 7.1 6.6 5.2* 5.7*  ALBUMIN  --  2.4* 1.8* 1.9*   No results for input(s): "LIPASE", "AMYLASE" in the last 168 hours. No results for input(s): "AMMONIA" in the last 168 hours. Coagulation Profile: Recent Labs  Lab 12/04/21 0944  INR 1.4*   Cardiac Enzymes: No results for input(s): "CKTOTAL", "CKMB", "CKMBINDEX", "TROPONINI" in the last 168 hours. BNP (last 3 results) No results for input(s): "PROBNP" in the last 8760 hours. HbA1C: No results for input(s): "HGBA1C" in the last 72 hours. CBG: No results for input(s): "GLUCAP" in the last 168 hours. Lipid Profile: No results for input(s): "CHOL", "HDL", "LDLCALC", "TRIG", "CHOLHDL", "LDLDIRECT" in the last 72 hours. Thyroid Function Tests: No results for input(s): "TSH", "T4TOTAL", "FREET4", "T3FREE", "THYROIDAB" in the last 72 hours. Anemia Panel: No results for input(s): "VITAMINB12", "FOLATE", "FERRITIN", "TIBC", "IRON", "RETICCTPCT" in the last 72 hours. Sepsis Labs: Recent Labs  Lab  12/04/21 0944 12/04/21 1134 12/05/21 2154 12/06/21 0414 12/07/21 1308 12/08/21 0521  PROCALCITON  --   --   --   --  3.93 3.38  LATICACIDVEN 2.9* 3.2* 4.2* 1.4  --   --     Recent Results (from the past 240 hour(s))  C Difficile Quick Screen w PCR reflex     Status: None   Collection Time: 12/04/21  9:25 AM   Specimen: STOOL  Result Value Ref Range Status   C Diff antigen NEGATIVE NEGATIVE Final   C Diff toxin NEGATIVE NEGATIVE Final   C Diff interpretation No C. difficile detected.  Final    Comment: Performed at [facility], [address] [place], [place] [address]  Resp Panel by RT-PCR (Flu A&B, Covid) Anterior Nasal Swab     Status: None   Collection Time: 12/04/21  9:37 AM   Specimen: Anterior Nasal Swab  Result Value Ref Range Status   SARS Coronavirus 2 by RT PCR NEGATIVE NEGATIVE Final    Comment: (NOTE) SARS-CoV-2 target nucleic acids are NOT DETECTED.  The SARS-CoV-2 RNA is generally detectable in upper respiratory specimens during the acute phase of infection. The lowest concentration of SARS-CoV-2 viral copies this assay can detect is 138 copies/mL. A negative result does not preclude SARS-Cov-2 infection and should not be used as the sole basis for treatment or other patient management decisions. A negative result may occur with  improper specimen collection/handling, submission of specimen other than nasopharyngeal swab, presence of viral mutation(s) within the areas targeted by this assay, and inadequate number of viral copies(<138 copies/mL). A negative result must be combined with clinical observations, patient history, and epidemiological information. The expected result is Negative.  Fact Sheet for Patients:  EntrepreneurPulse.com.au  Fact Sheet for Healthcare Providers:  IncredibleEmployment.be  This test is no t yet approved or cleared by the [place] FDA and  has been authorized for detection and/or  diagnosis of SARS-CoV-2 by FDA under an Emergency Use Authorization (EUA). This EUA will remain  in effect (meaning this test can be used) for the duration of the COVID-19 declaration under Section 564(b)(1) of the Act, 21 U.S.C.section 360bbb-3(b)(1), unless the authorization is terminated  or revoked sooner.       Influenza A by PCR NEGATIVE NEGATIVE Final   Influenza B by PCR NEGATIVE NEGATIVE Final    Comment: (NOTE) The Xpert Xpress SARS-CoV-2/FLU/RSV plus assay  is intended as an aid in the diagnosis of influenza from Nasopharyngeal swab specimens and should not be used as a sole basis for treatment. Nasal washings and aspirates are unacceptable for Xpert Xpress SARS-CoV-2/FLU/RSV testing.  Fact Sheet for Patients: EntrepreneurPulse.com.au  Fact Sheet for Healthcare Providers: IncredibleEmployment.be  This test is not yet approved or cleared by the [place] FDA and has been authorized for detection and/or diagnosis of SARS-CoV-2 by FDA under an Emergency Use Authorization (EUA). This EUA will remain in effect (meaning this test can be used) for the duration of the COVID-19 declaration under Section 564(b)(1) of the Act, 21 U.S.C. section 360bbb-3(b)(1), unless the authorization is terminated or revoked.  Performed at [facility], [address], [place], [place] [address]   Culture, blood (Routine x 2)     Status: None (Preliminary result)   Collection Time: 12/04/21  9:42 AM   Specimen: Left Antecubital; Blood  Result Value Ref Range Status   Specimen Description   Final    LEFT ANTECUBITAL BLOOD Performed at [place] [address] [place], [place] [address]    Special Requests   Final    Blood Culture results may not be optimal due to an excessive volume of blood received in culture bottles BOTTLES DRAWN AEROBIC AND ANAEROBIC Performed at [facility], [place] [address], [place], [place] [address]    Culture    Final    NO GROWTH 4 DAYS Performed at [facility], [address] [place], [place] [address]    Report Status PENDING  Incomplete  Culture, blood (Routine x 2)     Status: None (Preliminary result)   Collection Time: 12/04/21  9:43 AM   Specimen: Right Antecubital; Blood  Result Value Ref Range Status   Specimen Description   Final    RIGHT ANTECUBITAL BLOOD Performed at [facility], [place] [address], [place], [place] [address]    Special Requests   Final    Blood Culture adequate volume BOTTLES DRAWN AEROBIC AND ANAEROBIC Performed at [facility], [place] [address], [place], [place] [address]    Culture   Final    NO GROWTH 4 DAYS Performed at [facility], [address] [place], [place] [address]    Report Status PENDING  Incomplete  Urine Culture     Status: None   Collection Time: 12/04/21 11:23 AM   Specimen: In/Out Cath Urine  Result Value Ref Range Status   Specimen Description   Final    IN/OUT CATH URINE Performed at [facility], [address], [place], [place] [address]    Special Requests   Final    NONE Performed at [facility], [address] [place], [place] [address]    Culture   Final    NO GROWTH Performed at [facility], [place] [address] [place], [place] [address]    Report Status 12/05/2021 FINAL  Final  Gastrointestinal Panel by PCR , Stool     Status: None   Collection Time: 12/04/21  5:00 PM   Specimen: Stool  Result Value Ref Range Status   Campylobacter species NOT DETECTED NOT DETECTED Final   Plesimonas shigelloides NOT DETECTED NOT DETECTED Final   Salmonella species NOT DETECTED NOT DETECTED Final   Yersinia enterocolitica NOT DETECTED NOT DETECTED Final   Vibrio species NOT DETECTED NOT DETECTED Final   Vibrio cholerae NOT DETECTED NOT DETECTED Final   Enteroaggregative E coli (EAEC) NOT DETECTED NOT  DETECTED Final   Enteropathogenic E coli (EPEC) NOT DETECTED NOT DETECTED Final   Enterotoxigenic E coli (ETEC) NOT  DETECTED NOT DETECTED Final   Shiga like toxin producing E coli (STEC) NOT DETECTED NOT DETECTED Final   Shigella/Enteroinvasive E coli (EIEC) NOT DETECTED NOT DETECTED Final   Cryptosporidium NOT DETECTED NOT DETECTED Final   Cyclospora cayetanensis NOT DETECTED NOT DETECTED Final   Entamoeba histolytica NOT DETECTED NOT DETECTED Final   Giardia lamblia NOT DETECTED NOT DETECTED Final   Adenovirus F40/41 NOT DETECTED NOT DETECTED Final   Astrovirus NOT DETECTED NOT DETECTED Final   Norovirus GI/GII NOT DETECTED NOT DETECTED Final   Rotavirus A NOT DETECTED NOT DETECTED Final   Sapovirus (I, II, IV, and V) NOT DETECTED NOT DETECTED Final    Comment: Performed at [facility], [place] [place], [place] [address]  MRSA Next Gen by PCR, Nasal     Status: None   Collection Time: 12/04/21  6:02 PM   Specimen: STOOL; Nasal Swab  Result Value Ref Range Status   MRSA by PCR Next Gen NOT DETECTED NOT DETECTED Final    Comment: (NOTE) The GeneXpert MRSA Assay (FDA approved for NASAL specimens only), is one component of a comprehensive MRSA colonization surveillance program. It is not intended to diagnose MRSA infection nor to guide or monitor treatment for MRSA infections. Test performance is not FDA approved in patients less than 91 years old. Performed at [facility], [address] [place], [place] [address]          Radiology Studies: CT CHEST WO CONTRAST  Result Date: 12/08/2021 CLINICAL DATA:  Follow-up pneumonia. EXAM: CT CHEST WITHOUT CONTRAST TECHNIQUE: Multidetector CT imaging of the chest was performed following the standard protocol without IV contrast. RADIATION DOSE REDUCTION: This exam was performed according to the departmental dose-optimization program which includes automated exposure control, adjustment of the mA and/or kV according to patient size and/or use of iterative reconstruction technique. COMPARISON:  Prior chest CT 08/06/2020 and recent chest  x-ray 12/04/2021 FINDINGS: Cardiovascular: The heart is normal in size. No pericardial effusion. The aorta is normal in caliber. Stable age advanced atherosclerotic calcifications. Stable significant age advanced three-vessel coronary artery calcifications. Mediastinum/Nodes: Borderline enlarged mediastinal and hilar lymph nodes, likely reactive given the lung findings. The esophagus is grossly normal. Lungs/Pleura: Underlying emphysematous changes again noted. Fairly extensive right lower lobe airspace process with consolidation, surrounding ground-glass opacity and air bronchograms. Similar findings also noted in the right middle lobe. A few other patchy areas of inflammation or infection are noted in both lungs. No worrisome pulmonary lesions. There is a small right-sided parapneumonic effusion. The tracheobronchial tree is unremarkable. No findings for endobronchial lesion or obstruction. Prominent right hilar nodes likely reactive. Upper Abdomen: No significant upper abdominal findings. There is a stable 15 mm left adrenal gland nodule. This measures 14 Hounsfield units and is unchanged since a prior CT scan from 2020. This is consistent with a benign adenoma. Musculoskeletal: No chest wall mass, supraclavicular or axillary adenopathy. The bony thorax is intact. The bony thorax is intact. IMPRESSION: 1. Fairly extensive right lower lobe and right middle lobe airspace consolidation and air bronchograms consistent with pneumonia. 2. A few other patchy areas of inflammation or infection are noted in both lungs. 3. Small right-sided parapneumonic effusion. 4. Borderline enlarged mediastinal and right hilar lymph nodes, likely reactive given the lung findings. 5. Stable age advanced atherosclerotic calcifications involving the aorta and coronary arteries. 6. Stable left  adrenal gland adenoma. Aortic Atherosclerosis (ICD10-I70.0) and Emphysema (ICD10-J43.9). Electronically Signed   By: Marijo Sanes M.D.   On:  12/08/2021 10:55        Scheduled Meds:  budesonide (PULMICORT) nebulizer solution  0.5 mg Nebulization BID   Chlorhexidine Gluconate Cloth  6 each Topical Daily   dextromethorphan-guaiFENesin  1 tablet Oral BID   folic acid  1 mg Oral Daily   ipratropium-albuterol  3 mL Nebulization TID   multivitamin with minerals  1 tablet Oral Daily   nicotine  14 mg Transdermal Daily   thiamine  100 mg Oral Daily   Or   thiamine  100 mg Intravenous Daily   Continuous Infusions:  azithromycin Stopped (12/07/21 1437)   cefTRIAXone (ROCEPHIN)  IV Stopped (12/07/21 1807)   magnesium sulfate bolus IVPB 2 g (12/08/21 1227)   potassium chloride 10 mEq (12/08/21 1229)     LOS: 4 days    Time spent: 35 minutes    Lovie Zarling Darleen Crocker, DO [facility]  If 7PM-7AM, please contact night-coverage [email or web address] 12/08/2021, 1:15 PM

## 2021-12-09 DIAGNOSIS — J13 Pneumonia due to Streptococcus pneumoniae: Secondary | ICD-10-CM

## 2021-12-09 DIAGNOSIS — D72825 Bandemia: Secondary | ICD-10-CM

## 2021-12-09 DIAGNOSIS — R6520 Severe sepsis without septic shock: Secondary | ICD-10-CM | POA: Diagnosis not present

## 2021-12-09 DIAGNOSIS — A419 Sepsis, unspecified organism: Secondary | ICD-10-CM | POA: Diagnosis not present

## 2021-12-09 LAB — CBC
HCT: 21.7 % — ABNORMAL LOW (ref 39.0–52.0)
Hemoglobin: 7.5 g/dL — ABNORMAL LOW (ref 13.0–17.0)
MCH: 29.5 pg (ref 26.0–34.0)
MCHC: 34.6 g/dL (ref 30.0–36.0)
MCV: 85.4 fL (ref 80.0–100.0)
Platelets: 222 10*3/uL (ref 150–400)
RBC: 2.54 MIL/uL — ABNORMAL LOW (ref 4.22–5.81)
RDW: 18.3 % — ABNORMAL HIGH (ref 11.5–15.5)
WBC: 23.4 10*3/uL — ABNORMAL HIGH (ref 4.0–10.5)
nRBC: 1.1 % — ABNORMAL HIGH (ref 0.0–0.2)

## 2021-12-09 LAB — CULTURE, BLOOD (ROUTINE X 2)
Culture: NO GROWTH
Culture: NO GROWTH
Special Requests: ADEQUATE

## 2021-12-09 LAB — BASIC METABOLIC PANEL
Anion gap: 6 (ref 5–15)
BUN: 5 mg/dL — ABNORMAL LOW (ref 6–20)
CO2: 25 mmol/L (ref 22–32)
Calcium: 8 mg/dL — ABNORMAL LOW (ref 8.9–10.3)
Chloride: 102 mmol/L (ref 98–111)
Creatinine, Ser: 0.4 mg/dL — ABNORMAL LOW (ref 0.61–1.24)
GFR, Estimated: >60 mL/min (ref >60)
Glucose, Bld: 107 mg/dL — ABNORMAL HIGH (ref 70–99)
Potassium: 3.4 mmol/L — ABNORMAL LOW (ref 3.5–5.1)
Sodium: 133 mmol/L — ABNORMAL LOW (ref 135–145)

## 2021-12-09 LAB — MAGNESIUM: Magnesium: 1.8 mg/dL (ref 1.7–2.4)

## 2021-12-09 LAB — PROCALCITONIN: Procalcitonin: 2.07 ng/mL

## 2021-12-09 MED ORDER — SODIUM CHLORIDE 0.9 % IV SOLN
2.0000 g | INTRAVENOUS | Status: AC
  Administered 2021-12-09 – 2021-12-10 (×2): 2 g via INTRAVENOUS
  Filled 2021-12-09 (×2): qty 20

## 2021-12-09 MED ORDER — POTASSIUM CHLORIDE CRYS ER 20 MEQ PO TBCR
40.0000 meq | EXTENDED_RELEASE_TABLET | Freq: Two times a day (BID) | ORAL | Status: DC
  Administered 2021-12-09 – 2021-12-12 (×7): 40 meq via ORAL
  Filled 2021-12-09 (×7): qty 2

## 2021-12-09 NOTE — Consult Note (Signed)
NAME:  Nicholas Avila, MRN:  [id number], DOB:  Jun 26, 1960, LOS: 5 ADMISSION DATE:  12/04/2021, CONSULTATION DATE:  12/09/2021  REFERRING MD:  Lanice Shirts, CHIEF COMPLAINT: Diarrhea, pneumonia  History of Present Illness:  61 year old smoker admitted 7/23 with generalized weakness, diarrhea and chills, found to have right lower lobe pneumonia. CT chest 7/27 showed right middle and lower lobe infiltrates with underlying emphysema Urine strep antigen was positive.  He has been treated with ceftriaxone/azithromycin for 5 days but has increasing leukocytosis, WBC count has increased from 7.3 on admit to 23 K now Last fever of 100.3 was documented on 7/27 , procalcitonin is trending down from 3.9-2.0  Pertinent  Medical History  Alcoholism, in remission. Paroxysmal atrial flutter/fibrillation  Significant Hospital Events: Including procedures, antibiotic start and stop dates in addition to other pertinent events     Interim History / Subjective:  States improved, no fevers shortness of breath  Objective   Blood pressure 117/69, pulse 96, temperature 98.3 F (36.8 C), temperature source Oral, resp. rate 18, height '5\' 11"'$  (1.803 m), weight 55.5 kg, SpO2 95 %.        Intake/Output Summary (Last 24 hours) at 12/09/2021 1326 Last data filed at 12/09/2021 0730 Gross per 24 hour  Intake 820 ml  Output 800 ml  Net 20 ml   Filed Weights   12/07/21 0535 12/08/21 0500 12/09/21 0455  Weight: 55.7 kg 54.7 kg 55.5 kg    Examination: General: Sitting up in bed, on room air, no distress HENT: No pallor or icterus, no JVD or lymphadenopathy Lungs: Decreased breath sounds right base, no accessory muscle use Cardiovascular: S1-S2 regular, no murmur Abdomen: Soft, nontender Extremities: No edema, no deformity, 1+ clubbing Neuro: Alert, interactive, nonfocal  Resolved Hospital Problem list     Assessment & Plan:  Right lower lobe pneumococcal pneumonia -Atypical presentation with diarrhea with  no other organism identified on stool culture -Persistent leukocytosis , no evidence of significant parapneumonic effusion on CT 7/27 , no reason to suspect bacteremia secondary seeding or infective endocarditis, HIV negative  Recommend -Continue ceftriaxone complete 10 days -Trend CBC. -Repeat chest x-ray for parapneumonic effusion -If WBC count does not trend down in the next 1 to 2 days, will repeat blood cultures and check echo  Underlying emphysema -smoking cessation strongly emphasized -assess with PFTs as outpatient  PCCM will be available as needed  Best Practice (right click and "Reselect all SmartList Selections" daily)   Per [facility]  Code Status:  full code Last date of multidisciplinary goals of care discussion [NA]  Labs   CBC: Recent Labs  Lab 12/04/21 0944 12/05/21 0418 12/06/21 0414 12/07/21 0527 12/08/21 0521 12/09/21 0505  WBC 1.3* 2.1* 7.3 15.8* 20.6* 23.4*  NEUTROABS 0.5*  --   --   --   --   --   HGB 9.8* 8.1* 8.7* 8.0* 7.8* 7.5*  HCT 27.7* 23.5* 24.2* 22.6* 21.6* 21.7*  MCV 83.9 85.1 83.7 84.6 83.4 85.4  PLT 82* 73* 71* 112* 177 829    Basic Metabolic Panel: Recent Labs  Lab 12/04/21 1056 12/05/21 0418 12/06/21 0414 12/07/21 0527 12/08/21 0521 12/09/21 0505  NA  --  135 134* 135 135 133*  K  --  2.4* 2.5* 3.4* 3.0* 3.4*  CL  --  104 101 102 101 102  CO2  --  '26 26 25 27 25  '$ GLUCOSE  --  101* 108* 74 94 107*  BUN  --  6 5* 6 7  5*  CREATININE  --  0.43* 0.33* 0.45* 0.48* 0.40*  CALCIUM  --  7.6* 8.2* 8.5* 8.2* 8.0*  MG 1.3* 1.7 1.8  --  1.5* 1.8  PHOS  --  2.1* <1.0* 2.6  --   --    GFR: Estimated Creatinine Clearance: 77.1 mL/min (A) (by C-G formula based on SCr of 0.4 mg/dL (L)). Recent Labs  Lab 12/04/21 0944 12/04/21 1134 12/05/21 0418 12/05/21 2154 12/06/21 0414 12/07/21 0527 12/07/21 1308 12/08/21 0521 12/09/21 0505  PROCALCITON  --   --   --   --   --   --  3.93 3.38 2.07  WBC 1.3*  --    < >  --  7.3 15.8*  --  20.6*  23.4*  LATICACIDVEN 2.9* 3.2*  --  4.2* 1.4  --   --   --   --    < > = values in this interval not displayed.    Liver Function Tests: Recent Labs  Lab 12/04/21 0944 12/05/21 0418 12/06/21 0414  AST 108* 65* 50*  ALT 58* 38 36  ALKPHOS 185* 139* 163*  BILITOT 4.5* 3.2* 3.3*  PROT 6.6 5.2* 5.7*  ALBUMIN 2.4* 1.8* 1.9*   No results for input(s): "LIPASE", "AMYLASE" in the last 168 hours. No results for input(s): "AMMONIA" in the last 168 hours.  ABG No results found for: "PHART", "PCO2ART", "PO2ART", "HCO3", "TCO2", "ACIDBASEDEF", "O2SAT"   Coagulation Profile: Recent Labs  Lab 12/04/21 0944  INR 1.4*    Cardiac Enzymes: No results for input(s): "CKTOTAL", "CKMB", "CKMBINDEX", "TROPONINI" in the last 168 hours.  HbA1C: Hgb A1c MFr Bld  Date/Time Value Ref Range Status  07/27/2020 01:23 PM 5.4 4.8 - 5.6 % Final    Comment:    (NOTE) Pre diabetes:          5.7%-6.4%  Diabetes:              >6.4%  Glycemic control for   <7.0% adults with diabetes   12/11/2017 04:02 AM 5.1 4.8 - 5.6 % Final    Comment:    (NOTE) Pre diabetes:          5.7%-6.4% Diabetes:              >6.4% Glycemic control for   <7.0% adults with diabetes     CBG: No results for input(s): "GLUCAP" in the last 168 hours.  Review of Systems:   Diarrhea Fever, chills 8 to 10 pound weight loss No appetite coming back and gaining weight again  Past Medical History:  He,  has a past medical history of Alcoholism in remission [facility]), Atrial flutter ([place]), Dysrhythmia, Pulmonary nodules, and Tobacco use.   Surgical History:   Past Surgical History:  Procedure Laterality Date   CARDIOVERSION N/A 01/24/2018   Procedure: CARDIOVERSION;  Surgeon: Arnoldo Lenis, MD;  Location: [facility];  Service: Endoscopy;  Laterality: N/A;   COLONOSCOPY WITH PROPOFOL N/A 05/23/2018   Procedure: COLONOSCOPY WITH PROPOFOL;  Surgeon: Daneil Dolin, MD;  Location: [facility];  Service: Endoscopy;   Laterality: N/A;  8:30am   POLYPECTOMY  05/23/2018   Procedure: POLYPECTOMY;  Surgeon: Daneil Dolin, MD;  Location: [facility];  Service: Endoscopy;;  cecal polyp, descending polyp     Social History:   reports that he has been smoking cigarettes. He has a 26.25 pack-year smoking history. He has never used smokeless tobacco. He reports current alcohol use. He reports current drug use.  Drug: Marijuana.   Family History:  His family history includes Heart disease in his maternal aunt; Prostate cancer in his father. There is no history of Colon cancer or Liver disease.   Allergies No Known Allergies   Home Medications  Prior to Admission medications   Medication Sig Start Date End Date Taking? Authorizing Provider  acetaminophen (TYLENOL) 500 MG tablet Take 1,000 mg by mouth every 6 (six) hours as needed for moderate pain.   Yes [provider]  amLODipine (NORVASC) 10 MG tablet Take 10 mg by mouth daily. 06/10/21  Yes [provider]  HYDROcodone-acetaminophen (NORCO/VICODIN) 5-325 MG tablet One tablet every six hours for pain.  Limit 7 days. Patient taking differently: Take 1 tablet by mouth every 6 (six) hours as needed for moderate pain. 10/17/21  Yes Sanjuana Kava, MD  sildenafil (VIAGRA) 100 MG tablet Take 100 mg by mouth daily as needed. 09/23/21  Yes [provider]  polyethylene glycol-electrolytes (TRILYTE) 420 g solution Take 4,000 mLs by mouth as directed. Patient not taking: Reported on 12/04/2021 10/03/21   Rourk, Cristopher Estimable, MD     Kara Mead MD. FCCP. [facility] Pager : [phone]  If no response to pager , please call [phone] until 7 pm After 7:00 pm call Elink  [phone]   12/09/2021

## 2021-12-09 NOTE — Progress Notes (Signed)
PROGRESS NOTE    Nicholas Avila  PQZ:[id number] DOB: 08/28/1960 DOA: 12/04/2021 PCP: [name], MD   Brief Narrative:  Nicholas Avila is a 61 y.o. male with medical history significant of history of alcoholism in remission, tobacco abuse, prior history of transient atrial flutter/atrial fibrillation and hypertension; who presented to the hospital secondary to diarrhea, chills general malaise.  He was admitted for severe sepsis, present on admission secondary to right lower lobe pneumonia.  He continues to have some ongoing tachycardia.  CT chest performed 7/27 showing right lower and middle lobe infiltrates.  He continues to have some worsening leukocytosis.  Pulmonology consulted to assist with management 7/28 as patient does not appear to be having steady progression.  Assessment & Plan:   Principal Problem:   Severe sepsis ([place]) Active Problems:   Tobacco abuse   Alcoholism in remission ([place])   Diarrhea   Elevated LFTs   Hypokalemia   Hypomagnesemia   HTN (hypertension)  Assessment and Plan:  Severe sepsis ([place]) - Patient met criteria for sepsis at time of admission with leukopenia, tachypnea, tachycardia, elevated lactic acid, source of infection appreciated with infiltrates in his lungs (right lower lung) and having hypoxia as part of organ dysfunction. -MEWS score 5 at time of admission, currently 3-4 ([place]) -Continue current IV antibiotics with azithromycin completed after 5 days.  Plan to continue Rocephin for 2 more days for total 7 days at this point. -Continue mucolytic management, wean oxygen supplementation as tolerated and continue bronchodilators. -No wheezing on exam and overall demonstrating improvement in blood pressure and decreasing his heart rate. -Still tachypneic, but on exam not demonstrating labor breathing.. -Low threshold for steroid initiation. -Continue the use of flutter valve and start incentive spirometer. -continue  mucolytics. -Follow culture results and clinical response -lactic acid WNL now. -Procalcitonin remains elevated, but downtrending -Worsening leukocytosis noted -CT chest with findings of right lower and middle lobe pneumonia with worsening leukocytosis -Urine analysis positive for strep pneumonia -Appreciate pulmonology evaluation  Acute hypoxemic respiratory failure secondary to above -Typically on room air at home -Incentive spirometry -Wean as tolerated   HTN (hypertension) -Blood pressure soft but stable overall with a map above 70 -Continue to follow vital signs. -Continue holding antihypertensive agents.   Hypomagnesemia - Continue to provide repletion as needed -Follow electrolytes trend. -Continue telemetry monitoring.   Hypokalemia -In the setting of GI losses -Continue to provide repletion as needed -Follow electrolytes trend.   Elevated LFTs -Follow any GI service recommendations. Planning for outpatient liver biopsy -Continue IV fluids and treatment for sepsis meanwhile. -LFTs continue trending down. -Recheck CMP in a.m.   Diarrhea-resolved - Continue to maintain adequate hydration and replete electrolytes as needed -C. Diff and GI pathogen panel negative -As needed Lomotil will be added.   Alcoholism in remission [facility]) - Patient reports since 2019 -Congratulated and encouraged to keep himself alcohol free. -No withdrawal symptoms or tremor appreciated. -Started on CIWA protocol given ongoing tachycardia   Tobacco abuse -Cessation counseling provided -Nicotine patch has been ordered.   Thrombocytopenia-resolved -No overt bleeding appreciated -Presume associated with underlying liver disease and prior history of longstanding alcoholism.  Sepsis most likely playing also a role in decreasing his platelets count. -Continue to avoid heparin products     DVT prophylaxis: SCDs Code Status: Full Family Communication: Wife at bedside 7/28 Disposition  Plan:  Status is: Inpatient Remains inpatient appropriate because: IV medications     Consultants:  Pulmonology   Procedures:  None   Antimicrobials:  Anti-infectives (From admission, onward)    Start     Dose/Rate Route Frequency Ordered Stop   12/09/21 1400  cefTRIAXone (ROCEPHIN) 2 g in sodium chloride 0.9 % 100 mL IVPB        2 g 200 mL/hr over 30 Minutes Intravenous Every 24 hours 12/09/21 0752 12/11/21 1359   12/04/21 1800  cefTRIAXone (ROCEPHIN) 2 g in sodium chloride 0.9 % 100 mL IVPB        2 g 200 mL/hr over 30 Minutes Intravenous Every 24 hours 12/04/21 1245 12/08/21 1747   12/04/21 1300  azithromycin (ZITHROMAX) 500 mg in sodium chloride 0.9 % 250 mL IVPB        500 mg 250 mL/hr over 60 Minutes Intravenous Every 24 hours 12/04/21 1245 12/08/21 1447   12/04/21 1000  ceFEPIme (MAXIPIME) 2 g in sodium chloride 0.9 % 100 mL IVPB        2 g 200 mL/hr over 30 Minutes Intravenous  Once 12/04/21 0955 12/04/21 1055   12/04/21 1000  vancomycin (VANCOCIN) IVPB 1000 mg/200 mL premix        1,000 mg 200 mL/hr over 60 Minutes Intravenous  Once 12/04/21 0955 12/04/21 1158   12/04/21 0945  metroNIDAZOLE (FLAGYL) IVPB 500 mg        500 mg 100 mL/hr over 60 Minutes Intravenous  Once 12/04/21 0941 12/04/21 1055      Subjective: Patient seen and evaluated today with overall improvement in his condition.  He denies any shortness of breath or chest pain.  He continues to require oxygen and remains tachycardic.  Objective: Vitals:   12/09/21 0125 12/09/21 0416 12/09/21 0455 12/09/21 0915  BP:  104/62    Pulse: (!) 107 96    Resp: (!) 22 18    Temp:  98.6 F (37 C)    TempSrc:      SpO2:  94%  90%  Weight:   55.5 kg   Height:        Intake/Output Summary (Last 24 hours) at 12/09/2021 1317 Last data filed at 12/09/2021 0730 Gross per 24 hour  Intake 820 ml  Output 800 ml  Net 20 ml   Filed Weights   12/07/21 0535 12/08/21 0500 12/09/21 0455  Weight: 55.7 kg 54.7 kg 55.5  kg    Examination:  General exam: Appears calm and comfortable  Respiratory system: Clear to auscultation. Respiratory effort normal.  Currently on 2 L nasal cannula Cardiovascular system: S1 & S2 heard, RRR.  Gastrointestinal system: Abdomen is soft Central nervous system: Alert and awake Extremities: No edema Skin: No significant lesions noted Psychiatry: Flat affect.    Data Reviewed: I have personally reviewed following labs and imaging studies  CBC: Recent Labs  Lab 12/04/21 0944 12/05/21 0418 12/06/21 0414 12/07/21 0527 12/08/21 0521 12/09/21 0505  WBC 1.3* 2.1* 7.3 15.8* 20.6* 23.4*  NEUTROABS 0.5*  --   --   --   --   --   HGB 9.8* 8.1* 8.7* 8.0* 7.8* 7.5*  HCT 27.7* 23.5* 24.2* 22.6* 21.6* 21.7*  MCV 83.9 85.1 83.7 84.6 83.4 85.4  PLT 82* 73* 71* 112* 177 803   Basic Metabolic Panel: Recent Labs  Lab 12/04/21 1056 12/05/21 0418 12/06/21 0414 12/07/21 0527 12/08/21 0521 12/09/21 0505  NA  --  135 134* 135 135 133*  K  --  2.4* 2.5* 3.4* 3.0* 3.4*  CL  --  104 101 102 101 102  CO2  --  _0 GLUCOSE  --  101* 108* 74 94 107*  BUN  --  6 5* 6 7 5*  CREATININE  --  0.43* 0.33* 0.45* 0.48* 0.40*  CALCIUM  --  7.6* 8.2* 8.5* 8.2* 8.0*  MG 1.3* 1.7 1.8  --  1.5* 1.8  PHOS  --  2.1* <1.0* 2.6  --   --    GFR: Estimated Creatinine Clearance: 77.1 mL/min (A) (by C-G formula based on SCr of 0.4 mg/dL (L)). Liver Function Tests: Recent Labs  Lab 12/04/21 0944 12/05/21 0418 12/06/21 0414  AST 108* 65* 50*  ALT 58* 38 36  ALKPHOS 185* 139* 163*  BILITOT 4.5* 3.2* 3.3*  PROT 6.6 5.2* 5.7*  ALBUMIN 2.4* 1.8* 1.9*   No results for input(s): "LIPASE", "AMYLASE" in the last 168 hours. No results for input(s): "AMMONIA" in the last 168 hours. Coagulation Profile: Recent Labs  Lab 12/04/21 0944  INR 1.4*   Cardiac Enzymes: No results for input(s): "CKTOTAL", "CKMB", "CKMBINDEX", "TROPONINI" in the last 168 hours. BNP (last 3 results) No  results for input(s): "PROBNP" in the last 8760 hours. HbA1C: No results for input(s): "HGBA1C" in the last 72 hours. CBG: No results for input(s): "GLUCAP" in the last 168 hours. Lipid Profile: No results for input(s): "CHOL", "HDL", "LDLCALC", "TRIG", "CHOLHDL", "LDLDIRECT" in the last 72 hours. Thyroid Function Tests: No results for input(s): "TSH", "T4TOTAL", "FREET4", "T3FREE", "THYROIDAB" in the last 72 hours. Anemia Panel: No results for input(s): "VITAMINB12", "FOLATE", "FERRITIN", "TIBC", "IRON", "RETICCTPCT" in the last 72 hours. Sepsis Labs: Recent Labs  Lab 12/04/21 0944 12/04/21 1134 12/05/21 2154 12/06/21 0414 12/07/21 1308 12/08/21 0521 12/09/21 0505  PROCALCITON  --   --   --   --  3.93 3.38 2.07  LATICACIDVEN 2.9* 3.2* 4.2* 1.4  --   --   --     Recent Results (from the past 240 hour(s))  C Difficile Quick Screen w PCR reflex     Status: None   Collection Time: 12/04/21  9:25 AM   Specimen: STOOL  Result Value Ref Range Status   C Diff antigen NEGATIVE NEGATIVE Final   C Diff toxin NEGATIVE NEGATIVE Final   C Diff interpretation No C. difficile detected.  Final    Comment: Performed at [facility], [address], [place], [place] [address]  Resp Panel by RT-PCR (Flu A&B, Covid) Anterior Nasal Swab     Status: None   Collection Time: 12/04/21  9:37 AM   Specimen: Anterior Nasal Swab  Result Value Ref Range Status   SARS Coronavirus 2 by RT PCR NEGATIVE NEGATIVE Final    Comment: (NOTE) SARS-CoV-2 target nucleic acids are NOT DETECTED.  The SARS-CoV-2 RNA is generally detectable in upper respiratory specimens during the acute phase of infection. The lowest concentration of SARS-CoV-2 viral copies this assay can detect is 138 copies/mL. A negative result does not preclude SARS-Cov-2 infection and should not be used as the sole basis for treatment or other patient management decisions. A negative result may occur with  improper specimen  collection/handling, submission of specimen other than nasopharyngeal swab, presence of viral mutation(s) within the areas targeted by this assay, and inadequate number of viral copies(<138 copies/mL). A negative result must be combined with clinical observations, patient history, and epidemiological information. The expected result is Negative.  Fact Sheet for Patients:  EntrepreneurPulse.com.au  Fact Sheet for Healthcare Providers:  IncredibleEmployment.be  This test is no t yet approved or  cleared by the [place] and  has been authorized for detection and/or diagnosis of SARS-CoV-2 by FDA under an Emergency Use Authorization (EUA). This EUA will remain  in effect (meaning this test can be used) for the duration of the COVID-19 declaration under Section 564(b)(1) of the Act, 21 U.S.C.section 360bbb-3(b)(1), unless the authorization is terminated  or revoked sooner.       Influenza A by PCR NEGATIVE NEGATIVE Final   Influenza B by PCR NEGATIVE NEGATIVE Final    Comment: (NOTE) The Xpert Xpress SARS-CoV-2/FLU/RSV plus assay is intended as an aid in the diagnosis of influenza from Nasopharyngeal swab specimens and should not be used as a sole basis for treatment. Nasal washings and aspirates are unacceptable for Xpert Xpress SARS-CoV-2/FLU/RSV testing.  Fact Sheet for Patients: EntrepreneurPulse.com.au  Fact Sheet for Healthcare Providers: IncredibleEmployment.be  This test is not yet approved or cleared by the [place] FDA and has been authorized for detection and/or diagnosis of SARS-CoV-2 by FDA under an Emergency Use Authorization (EUA). This EUA will remain in effect (meaning this test can be used) for the duration of the COVID-19 declaration under Section 564(b)(1) of the Act, 21 U.S.C. section 360bbb-3(b)(1), unless the authorization is terminated or revoked.  Performed at [facility], [address], [place], [place] [address]   Culture, blood (Routine x 2)     Status: None   Collection Time: 12/04/21  9:42 AM   Specimen: Left Antecubital; Blood  Result Value Ref Range Status   Specimen Description   Final    LEFT ANTECUBITAL BLOOD Performed at [facility] [address] [place], [place] [address]    Special Requests   Final    Blood Culture results may not be optimal due to an excessive volume of blood received in culture bottles BOTTLES DRAWN AEROBIC AND ANAEROBIC Performed at [facility], [place] [address] [place], [place] [address]    Culture   Final    NO GROWTH 5 DAYS Performed at [facility], [address] [place], [place] [address]    Report Status 12/09/2021 FINAL  Final  Culture, blood (Routine x 2)     Status: None   Collection Time: 12/04/21  9:43 AM   Specimen: Right Antecubital; Blood  Result Value Ref Range Status   Specimen Description   Final    RIGHT ANTECUBITAL BLOOD Performed at [facility], [place] [address], [place], [place] [address]    Special Requests   Final    Blood Culture adequate volume BOTTLES DRAWN AEROBIC AND ANAEROBIC Performed at [facility], [place] [address] [place], [place] [address]    Culture   Final    NO GROWTH 5 DAYS Performed at [facility], [address], [place], [place] [address]    Report Status 12/09/2021 FINAL  Final  Urine Culture     Status: None   Collection Time: 12/04/21 11:23 AM   Specimen: In/Out Cath Urine  Result Value Ref Range Status   Specimen Description   Final    IN/OUT CATH URINE Performed at [facility], [address] [place], [place] [address]    Special Requests   Final    NONE Performed at [facility], [address] [place], [place] [address]    Culture   Final    NO GROWTH Performed at [facility], [place] [address] [place], [place] [address]    Report Status 12/05/2021 FINAL  Final  Gastrointestinal Panel by PCR , Stool     Status:  None   Collection Time: 12/04/21  5:00 PM   Specimen: Stool  Result Value Ref Range Status   Campylobacter species NOT DETECTED NOT DETECTED Final   Plesimonas shigelloides NOT DETECTED NOT DETECTED Final   Salmonella species NOT DETECTED NOT DETECTED Final   Yersinia enterocolitica NOT DETECTED NOT DETECTED Final   Vibrio species NOT DETECTED NOT DETECTED Final   Vibrio cholerae NOT DETECTED NOT DETECTED Final   Enteroaggregative E coli (EAEC) NOT DETECTED NOT DETECTED Final   Enteropathogenic E coli (EPEC) NOT DETECTED NOT DETECTED Final   Enterotoxigenic E coli (ETEC) NOT DETECTED NOT DETECTED Final   Shiga like toxin producing E coli (STEC) NOT DETECTED NOT DETECTED Final   Shigella/Enteroinvasive E coli (EIEC) NOT DETECTED NOT DETECTED Final   Cryptosporidium NOT DETECTED NOT DETECTED Final   Cyclospora cayetanensis NOT DETECTED NOT DETECTED Final   Entamoeba histolytica NOT DETECTED NOT DETECTED Final   Giardia lamblia NOT DETECTED NOT DETECTED Final   Adenovirus F40/41 NOT DETECTED NOT DETECTED Final   Astrovirus NOT DETECTED NOT DETECTED Final   Norovirus GI/GII NOT DETECTED NOT DETECTED Final   Rotavirus A NOT DETECTED NOT DETECTED Final   Sapovirus (I, II, IV, and V) NOT DETECTED NOT DETECTED Final    Comment: Performed at [facility], [address] [place], [place] [address]  MRSA Next Gen by PCR, Nasal     Status: None   Collection Time: 12/04/21  6:02 PM   Specimen: STOOL; Nasal Swab  Result Value Ref Range Status   MRSA by PCR Next Gen NOT DETECTED NOT DETECTED Final    Comment: (NOTE) The GeneXpert MRSA Assay (FDA approved for NASAL specimens only), is one component of a comprehensive MRSA colonization surveillance program. It is not intended to diagnose MRSA infection nor to guide or monitor treatment for MRSA infections. Test performance is not FDA approved in patients less than 19 years old. Performed at [facility], [address] [place],  [place] [address]          Radiology Studies: CT CHEST WO CONTRAST  Result Date: 12/08/2021 CLINICAL DATA:  Follow-up pneumonia. EXAM: CT CHEST WITHOUT CONTRAST TECHNIQUE: Multidetector CT imaging of the chest was performed following the standard protocol without IV contrast. RADIATION DOSE REDUCTION: This exam was performed according to the departmental dose-optimization program which includes automated exposure control, adjustment of the mA and/or kV according to patient size and/or use of iterative reconstruction technique. COMPARISON:  Prior chest CT 08/06/2020 and recent chest x-ray 12/04/2021 FINDINGS: Cardiovascular: The heart is normal in size. No pericardial effusion. The aorta is normal in caliber. Stable age advanced atherosclerotic calcifications. Stable significant age advanced three-vessel coronary artery calcifications. Mediastinum/Nodes: Borderline enlarged mediastinal and hilar lymph nodes, likely reactive given the lung findings. The esophagus is grossly normal. Lungs/Pleura: Underlying emphysematous changes again noted. Fairly extensive right lower lobe airspace process with consolidation, surrounding ground-glass opacity and air bronchograms. Similar findings also noted in the right middle lobe. A few other patchy areas of inflammation or infection are noted in both lungs. No worrisome pulmonary lesions. There is a small right-sided parapneumonic effusion. The tracheobronchial tree is unremarkable. No findings for endobronchial lesion or obstruction. Prominent right hilar nodes likely reactive. Upper Abdomen: No significant upper abdominal findings. There is a stable 15 mm left adrenal gland nodule. This measures 14 Hounsfield units and is unchanged since a prior CT scan from 2020. This is  consistent with a benign adenoma. Musculoskeletal: No chest wall mass, supraclavicular or axillary adenopathy. The bony thorax is intact. The bony thorax is intact. IMPRESSION: 1. Fairly extensive right lower  lobe and right middle lobe airspace consolidation and air bronchograms consistent with pneumonia. 2. A few other patchy areas of inflammation or infection are noted in both lungs. 3. Small right-sided parapneumonic effusion. 4. Borderline enlarged mediastinal and right hilar lymph nodes, likely reactive given the lung findings. 5. Stable age advanced atherosclerotic calcifications involving the aorta and coronary arteries. 6. Stable left adrenal gland adenoma. Aortic Atherosclerosis (ICD10-I70.0) and Emphysema (ICD10-J43.9). Electronically Signed   By: Marijo Sanes M.D.   On: 12/08/2021 10:55        Scheduled Meds:  budesonide (PULMICORT) nebulizer solution  0.5 mg Nebulization BID   Chlorhexidine Gluconate Cloth  6 each Topical Daily   dextromethorphan-guaiFENesin  1 tablet Oral BID   folic acid  1 mg Oral Daily   ipratropium-albuterol  3 mL Nebulization TID   multivitamin with minerals  1 tablet Oral Daily   nicotine  14 mg Transdermal Daily   potassium chloride  40 mEq Oral BID   thiamine  100 mg Oral Daily   Or   thiamine  100 mg Intravenous Daily   Continuous Infusions:  cefTRIAXone (ROCEPHIN)  IV       LOS: 5 days    Time spent: 35 minutes    Moriyah Byington Darleen Crocker, DO [facility]  If 7PM-7AM, please contact night-coverage [email or web address] 12/09/2021, 1:17 PM

## 2021-12-09 NOTE — Progress Notes (Addendum)
After use of incentive patient coughed up small amount of thick secretions x 3 -- secretions are white. Will try patient off BiPAP tonight.

## 2021-12-09 NOTE — Progress Notes (Signed)
Patients HR tachy, has been ranging from 107-114, noted to increase when moving. Denies pain. Respirations increased during sleeping. Bi-pap in place, will continue to monitor.

## 2021-12-10 DIAGNOSIS — A419 Sepsis, unspecified organism: Secondary | ICD-10-CM | POA: Diagnosis not present

## 2021-12-10 DIAGNOSIS — R6520 Severe sepsis without septic shock: Secondary | ICD-10-CM | POA: Diagnosis not present

## 2021-12-10 LAB — CBC
HCT: 21.2 % — ABNORMAL LOW (ref 39.0–52.0)
Hemoglobin: 7.3 g/dL — ABNORMAL LOW (ref 13.0–17.0)
MCH: 29.8 pg (ref 26.0–34.0)
MCHC: 34.4 g/dL (ref 30.0–36.0)
MCV: 86.5 fL (ref 80.0–100.0)
Platelets: 270 10*3/uL (ref 150–400)
RBC: 2.45 MIL/uL — ABNORMAL LOW (ref 4.22–5.81)
RDW: 18.7 % — ABNORMAL HIGH (ref 11.5–15.5)
WBC: 21.5 10*3/uL — ABNORMAL HIGH (ref 4.0–10.5)
nRBC: 1.2 % — ABNORMAL HIGH (ref 0.0–0.2)

## 2021-12-10 LAB — COMPREHENSIVE METABOLIC PANEL
ALT: 23 U/L (ref 0–44)
AST: 39 U/L (ref 15–41)
Albumin: 1.8 g/dL — ABNORMAL LOW (ref 3.5–5.0)
Alkaline Phosphatase: 172 U/L — ABNORMAL HIGH (ref 38–126)
Anion gap: 4 — ABNORMAL LOW (ref 5–15)
BUN: 5 mg/dL — ABNORMAL LOW (ref 6–20)
CO2: 26 mmol/L (ref 22–32)
Calcium: 8.1 mg/dL — ABNORMAL LOW (ref 8.9–10.3)
Chloride: 104 mmol/L (ref 98–111)
Creatinine, Ser: 0.39 mg/dL — ABNORMAL LOW (ref 0.61–1.24)
GFR, Estimated: >60 mL/min (ref >60)
Glucose, Bld: 110 mg/dL — ABNORMAL HIGH (ref 70–99)
Potassium: 3.5 mmol/L (ref 3.5–5.1)
Sodium: 134 mmol/L — ABNORMAL LOW (ref 135–145)
Total Bilirubin: 1.2 mg/dL (ref 0.3–1.2)
Total Protein: 5.8 g/dL — ABNORMAL LOW (ref 6.5–8.1)

## 2021-12-10 LAB — RETICULOCYTES
Immature Retic Fract: 43.1 % — ABNORMAL HIGH (ref 2.3–15.9)
RBC.: 2.51 MIL/uL — ABNORMAL LOW (ref 4.22–5.81)
Retic Count, Absolute: 81.3 10*3/uL (ref 19.0–186.0)
Retic Ct Pct: 3.2 % — ABNORMAL HIGH (ref 0.4–3.1)

## 2021-12-10 LAB — VITAMIN B12: Vitamin B-12: 1124 pg/mL — ABNORMAL HIGH (ref 180–914)

## 2021-12-10 LAB — FERRITIN: Ferritin: 651 ng/mL — ABNORMAL HIGH (ref 24–336)

## 2021-12-10 LAB — IRON AND TIBC
Iron: 20 ug/dL — ABNORMAL LOW (ref 45–182)
Saturation Ratios: 16 % — ABNORMAL LOW (ref 17.9–39.5)
TIBC: 124 ug/dL — ABNORMAL LOW (ref 250–450)
UIBC: 104 ug/dL

## 2021-12-10 LAB — FOLATE: Folate: 12.9 ng/mL (ref >5.9)

## 2021-12-10 LAB — MAGNESIUM: Magnesium: 1.6 mg/dL — ABNORMAL LOW (ref 1.7–2.4)

## 2021-12-10 MED ORDER — MAGNESIUM SULFATE 2 GM/50ML IV SOLN
2.0000 g | Freq: Once | INTRAVENOUS | Status: AC
  Administered 2021-12-10: 2 g via INTRAVENOUS
  Filled 2021-12-10: qty 50

## 2021-12-10 NOTE — Progress Notes (Signed)
Pt alert and oriented no complaints of pain at this present time. Provided patient with fresh water and ice. Tolerated PO med's fine. Dc' d telemetry per MD orders. IV magnesium infusing into RFA. Removed LAC patient complained with burning with flush. Call light within reach. Will continue to monitor patient.

## 2021-12-10 NOTE — Progress Notes (Signed)
Achieved 1500 x 2 with incentive spirometry. No complaints of SOB present at this time. O2 stable mid 90s on 3L o2.  Lungs clear with congested cough.

## 2021-12-10 NOTE — Progress Notes (Signed)
PROGRESS NOTE    Nicholas Avila  FRT:[id number] DOB: [birth year] DOA: 12/04/2021 PCP: [name], MD   Brief Narrative:    Nicholas Avila is a 61 y.o. male with medical history significant of history of alcoholism in remission, tobacco abuse, prior history of transient atrial flutter/atrial fibrillation and hypertension; who presented to the hospital secondary to diarrhea, chills general malaise.  He was admitted for severe sepsis, present on admission secondary to right lower lobe pneumonia.  He continues to have some ongoing tachycardia.  CT chest performed 7/27 showing right lower and middle lobe infiltrates.  He continues to have some worsening leukocytosis.  Pulmonology consulted to assist with management 7/28 as patient does not appear to be having steady progression.  Assessment & Plan:   Principal Problem:   Severe sepsis ([place]) Active Problems:   Tobacco abuse   Alcoholism in remission ([name])   Diarrhea   Elevated LFTs   Hypokalemia   Hypomagnesemia   HTN (hypertension)  Assessment and Plan:   Severe sepsis ([place]) - Patient met criteria for sepsis at time of admission with leukopenia, tachypnea, tachycardia, elevated lactic acid, source of infection appreciated with infiltrates in his lungs (right lower lung) and having hypoxia as part of organ dysfunction. -MEWS score 5 at time of admission, currently 3-4 ([place]) -Continue current IV antibiotics with azithromycin completed after 5 days.  Plan to continue Rocephin for total 10 days and is currently day 6/10. -Continue mucolytic management, wean oxygen supplementation as tolerated and continue bronchodilators. -No wheezing on exam and overall demonstrating improvement in blood pressure and decreasing his heart rate. -Still tachypneic, but on exam not demonstrating labor breathing.. -Low threshold for steroid initiation. -Continue the use of flutter valve and start incentive spirometer. -continue  mucolytics. -Follow culture results and clinical response -lactic acid WNL now. -Procalcitonin remains elevated, but downtrending -Worsening leukocytosis noted -CT chest with findings of right lower and middle lobe pneumonia with worsening leukocytosis -Urine analysis positive for strep pneumonia -Appreciate pulmonology evaluation with plans to reevaluate parapneumonic effusion on chest x-ray in a.m. -May need to consider blood culture and 2D echocardiogram if leukocytosis is not improving   Acute hypoxemic respiratory failure secondary to above -Typically on room air at home -Incentive spirometry -Wean as tolerated   HTN (hypertension) -Blood pressure soft but stable overall with a map above 70 -Continue to follow vital signs. -Continue holding antihypertensive agents.   Hypomagnesemia - Continue to provide repletion as needed -Follow electrolytes trend. -Continue telemetry monitoring.   Hypokalemia -In the setting of GI losses -Continue to provide repletion as needed -Follow electrolytes trend.   Elevated LFTs -Follow any GI service recommendations. Planning for outpatient liver biopsy -Continue IV fluids and treatment for sepsis meanwhile. -LFTs continue trending down. -Recheck CMP in a.m.   Diarrhea-resolved - Continue to maintain adequate hydration and replete electrolytes as needed -C. Diff and GI pathogen panel negative -As needed Lomotil will be added.   Alcoholism in remission [facility]) - Patient reports since 2019 -Congratulated and encouraged to keep himself alcohol free. -No withdrawal symptoms or tremor appreciated. -Started on CIWA protocol given ongoing tachycardia   Tobacco abuse -Cessation counseling provided -Nicotine patch has been ordered.   Thrombocytopenia-resolved -No overt bleeding appreciated -Presume associated with underlying liver disease and prior history of longstanding alcoholism.  Sepsis most likely playing also a role in decreasing his  platelets count. -Continue to avoid heparin products  Worsening anemia -Check anemia panel and stool occult -Transfuse for hemoglobin  less than 7     DVT prophylaxis: SCDs Code Status: Full Family Communication: Wife at bedside 7/28 Disposition Plan:  Status is: Inpatient Remains inpatient appropriate because: IV medications     Consultants:  Pulmonology   Procedures:  None   Antimicrobials:  Anti-infectives (From admission, onward)    Start     Dose/Rate Route Frequency Ordered Stop   12/09/21 1400  cefTRIAXone (ROCEPHIN) 2 g in sodium chloride 0.9 % 100 mL IVPB        2 g 200 mL/hr over 30 Minutes Intravenous Every 24 hours 12/09/21 0752 12/11/21 1359   12/04/21 1800  cefTRIAXone (ROCEPHIN) 2 g in sodium chloride 0.9 % 100 mL IVPB        2 g 200 mL/hr over 30 Minutes Intravenous Every 24 hours 12/04/21 1245 12/08/21 1747   12/04/21 1300  azithromycin (ZITHROMAX) 500 mg in sodium chloride 0.9 % 250 mL IVPB        500 mg 250 mL/hr over 60 Minutes Intravenous Every 24 hours 12/04/21 1245 12/08/21 1447   12/04/21 1000  ceFEPIme (MAXIPIME) 2 g in sodium chloride 0.9 % 100 mL IVPB        2 g 200 mL/hr over 30 Minutes Intravenous  Once 12/04/21 0955 12/04/21 1055   12/04/21 1000  vancomycin (VANCOCIN) IVPB 1000 mg/200 mL premix        1,000 mg 200 mL/hr over 60 Minutes Intravenous  Once 12/04/21 0955 12/04/21 1158   12/04/21 0945  metroNIDAZOLE (FLAGYL) IVPB 500 mg        500 mg 100 mL/hr over 60 Minutes Intravenous  Once 12/04/21 0941 12/04/21 1055      Subjective: Patient seen and evaluated today with no new acute complaints or concerns. No acute concerns or events noted overnight.  Objective: Vitals:   12/10/21 0437 12/10/21 0437 12/10/21 0713 12/10/21 0721  BP:  (!) 105/59    Pulse:  100    Resp:      Temp:  98.3 F (36.8 C)    TempSrc:  Oral    SpO2:  97% 96% 96%  Weight: 54.7 kg     Height:        Intake/Output Summary (Last 24 hours) at 12/10/2021  1005 Last data filed at 12/10/2021 0845 Gross per 24 hour  Intake 220 ml  Output 1400 ml  Net -1180 ml   Filed Weights   12/08/21 0500 12/09/21 0455 12/10/21 0437  Weight: 54.7 kg 55.5 kg 54.7 kg    Examination:  General exam: Appears calm and comfortable  Respiratory system: Clear to auscultation. Respiratory effort normal.  2 L nasal cannula Cardiovascular system: S1 & S2 heard, RRR.  Gastrointestinal system: Abdomen is soft Central nervous system: Alert and awake Extremities: No edema Skin: No significant lesions noted Psychiatry: Flat affect.    Data Reviewed: I have personally reviewed following labs and imaging studies  CBC: Recent Labs  Lab 12/04/21 0944 12/05/21 0418 12/06/21 0414 12/07/21 0527 12/08/21 0521 12/09/21 0505 12/10/21 0523  WBC 1.3*   < > 7.3 15.8* 20.6* 23.4* 21.5*  NEUTROABS 0.5*  --   --   --   --   --   --   HGB 9.8*   < > 8.7* 8.0* 7.8* 7.5* 7.3*  HCT 27.7*   < > 24.2* 22.6* 21.6* 21.7* 21.2*  MCV 83.9   < > 83.7 84.6 83.4 85.4 86.5  PLT 82*   < > 71* 112* 177 222 270   < > =  values in this interval not displayed.   Basic Metabolic Panel: Recent Labs  Lab 12/05/21 0418 12/06/21 0414 12/07/21 0527 12/08/21 0521 12/09/21 0505 12/10/21 0523  NA 135 134* 135 135 133* 134*  K 2.4* 2.5* 3.4* 3.0* 3.4* 3.5  CL 104 101 102 101 102 104  CO2 _0 GLUCOSE 101* 108* 74 94 107* 110*  BUN 6 5* 6 7 5* 5*  CREATININE 0.43* 0.33* 0.45* 0.48* 0.40* 0.39*  CALCIUM 7.6* 8.2* 8.5* 8.2* 8.0* 8.1*  MG 1.7 1.8  --  1.5* 1.8 1.6*  PHOS 2.1* <1.0* 2.6  --   --   --    GFR: Estimated Creatinine Clearance: 76 mL/min (A) (by C-G formula based on SCr of 0.39 mg/dL (L)). Liver Function Tests: Recent Labs  Lab 12/04/21 0944 12/05/21 0418 12/06/21 0414 12/10/21 0523  AST 108* 65* 50* 39  ALT 58* 38 36 23  ALKPHOS 185* 139* 163* 172*  BILITOT 4.5* 3.2* 3.3* 1.2  PROT 6.6 5.2* 5.7* 5.8*  ALBUMIN 2.4* 1.8* 1.9* 1.8*   No results for  input(s): "LIPASE", "AMYLASE" in the last 168 hours. No results for input(s): "AMMONIA" in the last 168 hours. Coagulation Profile: Recent Labs  Lab 12/04/21 0944  INR 1.4*   Cardiac Enzymes: No results for input(s): "CKTOTAL", "CKMB", "CKMBINDEX", "TROPONINI" in the last 168 hours. BNP (last 3 results) No results for input(s): "PROBNP" in the last 8760 hours. HbA1C: No results for input(s): "HGBA1C" in the last 72 hours. CBG: No results for input(s): "GLUCAP" in the last 168 hours. Lipid Profile: No results for input(s): "CHOL", "HDL", "LDLCALC", "TRIG", "CHOLHDL", "LDLDIRECT" in the last 72 hours. Thyroid Function Tests: No results for input(s): "TSH", "T4TOTAL", "FREET4", "T3FREE", "THYROIDAB" in the last 72 hours. Anemia Panel: No results for input(s): "VITAMINB12", "FOLATE", "FERRITIN", "TIBC", "IRON", "RETICCTPCT" in the last 72 hours. Sepsis Labs: Recent Labs  Lab 12/04/21 0944 12/04/21 1134 12/05/21 2154 12/06/21 0414 12/07/21 1308 12/08/21 0521 12/09/21 0505  PROCALCITON  --   --   --   --  3.93 3.38 2.07  LATICACIDVEN 2.9* 3.2* 4.2* 1.4  --   --   --     Recent Results (from the past 240 hour(s))  C Difficile Quick Screen w PCR reflex     Status: None   Collection Time: 12/04/21  9:25 AM   Specimen: STOOL  Result Value Ref Range Status   C Diff antigen NEGATIVE NEGATIVE Final   C Diff toxin NEGATIVE NEGATIVE Final   C Diff interpretation No C. difficile detected.  Final    Comment: Performed at [facility], [address], [place], [place] [address]  Resp Panel by RT-PCR (Flu A&B, Covid) Anterior Nasal Swab     Status: None   Collection Time: 12/04/21  9:37 AM   Specimen: Anterior Nasal Swab  Result Value Ref Range Status   SARS Coronavirus 2 by RT PCR NEGATIVE NEGATIVE Final    Comment: (NOTE) SARS-CoV-2 target nucleic acids are NOT DETECTED.  The SARS-CoV-2 RNA is generally detectable in upper respiratory specimens during the acute phase of infection.  The lowest concentration of SARS-CoV-2 viral copies this assay can detect is 138 copies/mL. A negative result does not preclude SARS-Cov-2 infection and should not be used as the sole basis for treatment or other patient management decisions. A negative result may occur with  improper specimen collection/handling, submission of specimen other than nasopharyngeal swab, presence of viral mutation(s) within the areas  targeted by this assay, and inadequate number of viral copies(<138 copies/mL). A negative result must be combined with clinical observations, patient history, and epidemiological information. The expected result is Negative.  Fact Sheet for Patients:  EntrepreneurPulse.com.au  Fact Sheet for Healthcare Providers:  IncredibleEmployment.be  This test is no t yet approved or cleared by the [place] FDA and  has been authorized for detection and/or diagnosis of SARS-CoV-2 by FDA under an Emergency Use Authorization (EUA). This EUA will remain  in effect (meaning this test can be used) for the duration of the COVID-19 declaration under Section 564(b)(1) of the Act, 21 U.S.C.section 360bbb-3(b)(1), unless the authorization is terminated  or revoked sooner.       Influenza A by PCR NEGATIVE NEGATIVE Final   Influenza B by PCR NEGATIVE NEGATIVE Final    Comment: (NOTE) The Xpert Xpress SARS-CoV-2/FLU/RSV plus assay is intended as an aid in the diagnosis of influenza from Nasopharyngeal swab specimens and should not be used as a sole basis for treatment. Nasal washings and aspirates are unacceptable for Xpert Xpress SARS-CoV-2/FLU/RSV testing.  Fact Sheet for Patients: EntrepreneurPulse.com.au  Fact Sheet for Healthcare Providers: IncredibleEmployment.be  This test is not yet approved or cleared by the [place] FDA and has been authorized for detection and/or diagnosis of SARS-CoV-2 by FDA  under an Emergency Use Authorization (EUA). This EUA will remain in effect (meaning this test can be used) for the duration of the COVID-19 declaration under Section 564(b)(1) of the Act, 21 U.S.C. section 360bbb-3(b)(1), unless the authorization is terminated or revoked.  Performed at [facility], [address], [place], [place] [address]   Culture, blood (Routine x 2)     Status: None   Collection Time: 12/04/21  9:42 AM   Specimen: Left Antecubital; Blood  Result Value Ref Range Status   Specimen Description   Final    LEFT ANTECUBITAL BLOOD Performed at [place] [address] [place], [place] [address]    Special Requests   Final    Blood Culture results may not be optimal due to an excessive volume of blood received in culture bottles BOTTLES DRAWN AEROBIC AND ANAEROBIC Performed at [facility], [place] [address], [place], [place] [address]    Culture   Final    NO GROWTH 5 DAYS Performed at [facility], [address], [place], [place] [address]    Report Status 12/09/2021 FINAL  Final  Culture, blood (Routine x 2)     Status: None   Collection Time: 12/04/21  9:43 AM   Specimen: Right Antecubital; Blood  Result Value Ref Range Status   Specimen Description   Final    RIGHT ANTECUBITAL BLOOD Performed at [facility], [place] [address], [place], [place] [address]    Special Requests   Final    Blood Culture adequate volume BOTTLES DRAWN AEROBIC AND ANAEROBIC Performed at [facility], [place] [address], [place], [place] [address]    Culture   Final    NO GROWTH 5 DAYS Performed at [facility], [address], [place], [place] [address]    Report Status 12/09/2021 FINAL  Final  Urine Culture     Status: None   Collection Time: 12/04/21 11:23 AM   Specimen: In/Out Cath Urine  Result Value Ref Range Status   Specimen Description   Final    IN/OUT CATH URINE Performed at [facility], [address], [place], [place] [address]  Special  Requests   Final    NONE Performed at [facility], [address], [place], [place] [address]    Culture   Final    NO GROWTH Performed at [facility], [place] [address] [place], [place] [address]    Report Status 12/05/2021 FINAL  Final  Gastrointestinal Panel by PCR , Stool     Status: None   Collection Time: 12/04/21  5:00 PM   Specimen: Stool  Result Value Ref Range Status   Campylobacter species NOT DETECTED NOT DETECTED Final   Plesimonas shigelloides NOT DETECTED NOT DETECTED Final   Salmonella species NOT DETECTED NOT DETECTED Final   Yersinia enterocolitica NOT DETECTED NOT DETECTED Final   Vibrio species NOT DETECTED NOT DETECTED Final   Vibrio cholerae NOT DETECTED NOT DETECTED Final   Enteroaggregative E coli (EAEC) NOT DETECTED NOT DETECTED Final   Enteropathogenic E coli (EPEC) NOT DETECTED NOT DETECTED Final   Enterotoxigenic E coli (ETEC) NOT DETECTED NOT DETECTED Final   Shiga like toxin producing E coli (STEC) NOT DETECTED NOT DETECTED Final   Shigella/Enteroinvasive E coli (EIEC) NOT DETECTED NOT DETECTED Final   Cryptosporidium NOT DETECTED NOT DETECTED Final   Cyclospora cayetanensis NOT DETECTED NOT DETECTED Final   Entamoeba histolytica NOT DETECTED NOT DETECTED Final   Giardia lamblia NOT DETECTED NOT DETECTED Final   Adenovirus F40/41 NOT DETECTED NOT DETECTED Final   Astrovirus NOT DETECTED NOT DETECTED Final   Norovirus GI/GII NOT DETECTED NOT DETECTED Final   Rotavirus A NOT DETECTED NOT DETECTED Final   Sapovirus (I, II, IV, and V) NOT DETECTED NOT DETECTED Final    Comment: Performed at [facility], [address] [place], [place] [address]  MRSA Next Gen by PCR, Nasal     Status: None   Collection Time: 12/04/21  6:02 PM   Specimen: STOOL; Nasal Swab  Result Value Ref Range Status   MRSA by PCR Next Gen NOT DETECTED NOT DETECTED Final    Comment: (NOTE) The GeneXpert MRSA Assay (FDA approved for NASAL specimens only), is one  component of a comprehensive MRSA colonization surveillance program. It is not intended to diagnose MRSA infection nor to guide or monitor treatment for MRSA infections. Test performance is not FDA approved in patients less than 88 years old. Performed at [facility], [address] [place], [place] [address]          Radiology Studies: CT CHEST WO CONTRAST  Result Date: 12/08/2021 CLINICAL DATA:  Follow-up pneumonia. EXAM: CT CHEST WITHOUT CONTRAST TECHNIQUE: Multidetector CT imaging of the chest was performed following the standard protocol without IV contrast. RADIATION DOSE REDUCTION: This exam was performed according to the departmental dose-optimization program which includes automated exposure control, adjustment of the mA and/or kV according to patient size and/or use of iterative reconstruction technique. COMPARISON:  Prior chest CT 08/06/2020 and recent chest x-ray 12/04/2021 FINDINGS: Cardiovascular: The heart is normal in size. No pericardial effusion. The aorta is normal in caliber. Stable age advanced atherosclerotic calcifications. Stable significant age advanced three-vessel coronary artery calcifications. Mediastinum/Nodes: Borderline enlarged mediastinal and hilar lymph nodes, likely reactive given the lung findings. The esophagus is grossly normal. Lungs/Pleura: Underlying emphysematous changes again noted. Fairly extensive right lower lobe airspace process with consolidation, surrounding ground-glass opacity and air bronchograms. Similar findings also noted in the right middle lobe. A few other patchy areas of inflammation or infection are noted in both lungs. No worrisome pulmonary lesions. There is a small  right-sided parapneumonic effusion. The tracheobronchial tree is unremarkable. No findings for endobronchial lesion or obstruction. Prominent right hilar nodes likely reactive. Upper Abdomen: No significant upper abdominal findings. There is a stable 15 mm left adrenal gland  nodule. This measures 14 Hounsfield units and is unchanged since a prior CT scan from 2020. This is consistent with a benign adenoma. Musculoskeletal: No chest wall mass, supraclavicular or axillary adenopathy. The bony thorax is intact. The bony thorax is intact. IMPRESSION: 1. Fairly extensive right lower lobe and right middle lobe airspace consolidation and air bronchograms consistent with pneumonia. 2. A few other patchy areas of inflammation or infection are noted in both lungs. 3. Small right-sided parapneumonic effusion. 4. Borderline enlarged mediastinal and right hilar lymph nodes, likely reactive given the lung findings. 5. Stable age advanced atherosclerotic calcifications involving the aorta and coronary arteries. 6. Stable left adrenal gland adenoma. Aortic Atherosclerosis (ICD10-I70.0) and Emphysema (ICD10-J43.9). Electronically Signed   By: Marijo Sanes M.D.   On: 12/08/2021 10:55        Scheduled Meds:  budesonide (PULMICORT) nebulizer solution  0.5 mg Nebulization BID   Chlorhexidine Gluconate Cloth  6 each Topical Daily   dextromethorphan-guaiFENesin  1 tablet Oral BID   folic acid  1 mg Oral Daily   ipratropium-albuterol  3 mL Nebulization TID   multivitamin with minerals  1 tablet Oral Daily   nicotine  14 mg Transdermal Daily   potassium chloride  40 mEq Oral BID   thiamine  100 mg Oral Daily   Or   thiamine  100 mg Intravenous Daily   Continuous Infusions:  cefTRIAXone (ROCEPHIN)  IV 2 g (12/09/21 1408)   magnesium sulfate bolus IVPB 2 g (12/10/21 0921)     LOS: 6 days    Time spent: 35 minutes    Pratik Darleen Crocker, DO [facility]  If 7PM-7AM, please contact night-coverage [email or web address] 12/10/2021, 10:05 AM

## 2021-12-11 ENCOUNTER — Inpatient Hospital Stay (HOSPITAL_COMMUNITY): Payer: BC Managed Care – PPO

## 2021-12-11 DIAGNOSIS — A419 Sepsis, unspecified organism: Secondary | ICD-10-CM | POA: Diagnosis not present

## 2021-12-11 DIAGNOSIS — R6520 Severe sepsis without septic shock: Secondary | ICD-10-CM | POA: Diagnosis not present

## 2021-12-11 LAB — BASIC METABOLIC PANEL
Anion gap: 5 (ref 5–15)
BUN: <5 mg/dL — ABNORMAL LOW (ref 6–20)
CO2: 24 mmol/L (ref 22–32)
Calcium: 8.2 mg/dL — ABNORMAL LOW (ref 8.9–10.3)
Chloride: 105 mmol/L (ref 98–111)
Creatinine, Ser: 0.38 mg/dL — ABNORMAL LOW (ref 0.61–1.24)
GFR, Estimated: >60 mL/min (ref >60)
Glucose, Bld: 97 mg/dL (ref 70–99)
Potassium: 4 mmol/L (ref 3.5–5.1)
Sodium: 134 mmol/L — ABNORMAL LOW (ref 135–145)

## 2021-12-11 LAB — CBC
HCT: 23 % — ABNORMAL LOW (ref 39.0–52.0)
Hemoglobin: 7.5 g/dL — ABNORMAL LOW (ref 13.0–17.0)
MCH: 29 pg (ref 26.0–34.0)
MCHC: 32.6 g/dL (ref 30.0–36.0)
MCV: 88.8 fL (ref 80.0–100.0)
Platelets: 347 10*3/uL (ref 150–400)
RBC: 2.59 MIL/uL — ABNORMAL LOW (ref 4.22–5.81)
RDW: 18.4 % — ABNORMAL HIGH (ref 11.5–15.5)
WBC: 21.4 10*3/uL — ABNORMAL HIGH (ref 4.0–10.5)
nRBC: 0.4 % — ABNORMAL HIGH (ref 0.0–0.2)

## 2021-12-11 LAB — MAGNESIUM: Magnesium: 1.7 mg/dL (ref 1.7–2.4)

## 2021-12-11 NOTE — Progress Notes (Signed)
PROGRESS NOTE    Nicholas Avila  IWP:[id number] DOB: 02-Feb-1961 DOA: 12/04/2021 PCP: [name], MD   Brief Narrative:  Nicholas Avila is a 61 y.o. male with medical history significant of history of alcoholism in remission, tobacco abuse, prior history of transient atrial flutter/atrial fibrillation and hypertension; who presented to the hospital secondary to diarrhea, chills general malaise.  He was admitted for severe sepsis, present on admission secondary to right lower lobe pneumonia.  He continues to have some ongoing tachycardia.  CT chest performed 7/27 showing right lower and middle lobe infiltrates.  He continues to have some worsening leukocytosis.  Pulmonology consulted to assist with management 7/28 as patient does not appear to be having steady progression.  Assessment & Plan:   Principal Problem:   Severe sepsis ([place]) Active Problems:   Tobacco abuse   Alcoholism in remission ([place])   Diarrhea   Elevated LFTs   Hypokalemia   Hypomagnesemia   HTN (hypertension)  Assessment and Plan:   Severe sepsis ([name]) - Patient met criteria for sepsis at time of admission with leukopenia, tachypnea, tachycardia, elevated lactic acid, source of infection appreciated with infiltrates in his lungs (right lower lung) and having hypoxia as part of organ dysfunction. -MEWS score 5 at time of admission, currently 3-4 ([place]) -Continue current IV antibiotics with azithromycin completed after 5 days.  Plan to continue Rocephin for total 10 days and is currently day 7/10. -Continue mucolytic management, wean oxygen supplementation as tolerated and continue bronchodilators. -No wheezing on exam and overall demonstrating improvement in blood pressure and decreasing his heart rate. -Still tachypneic, but on exam not demonstrating labor breathing.. -Low threshold for steroid initiation. -Continue the use of flutter valve and start incentive spirometer. -continue  mucolytics. -Follow culture results and clinical response -lactic acid WNL now. -Procalcitonin remains elevated, but downtrending -Worsening leukocytosis noted -CT chest with findings of right lower and middle lobe pneumonia with worsening leukocytosis -Urine analysis positive for strep pneumonia -Appreciate pulmonology evaluation  -Chest x-ray 7/30 with worsening right-sided pneumonia noted -May need to consider blood culture and 2D echocardiogram if leukocytosis is not improving   Acute hypoxemic respiratory failure secondary to above -Typically on room air at home -Incentive spirometry -Wean as tolerated   HTN (hypertension) -Blood pressure soft but stable overall with a map above 70 -Continue to follow vital signs. -Continue holding antihypertensive agents.   Hypomagnesemia - Continue to provide repletion as needed -Follow electrolytes trend. -Continue telemetry monitoring.   Hypokalemia -In the setting of GI losses -Continue to provide repletion as needed -Follow electrolytes trend.   Elevated LFTs -Follow any GI service recommendations. Planning for outpatient liver biopsy -Continue IV fluids and treatment for sepsis meanwhile. -LFTs continue trending down. -Recheck CMP in a.m.   Diarrhea-resolved - Continue to maintain adequate hydration and replete electrolytes as needed -C. Diff and GI pathogen panel negative -As needed Lomotil will be added.   Alcoholism in remission [facility]) - Patient reports since 2019 -Congratulated and encouraged to keep himself alcohol free. -No withdrawal symptoms or tremor appreciated. -Started on CIWA protocol given ongoing tachycardia   Tobacco abuse -Cessation counseling provided -Nicotine patch has been ordered.   Thrombocytopenia-resolved -No overt bleeding appreciated -Presume associated with underlying liver disease and prior history of longstanding alcoholism.  Sepsis most likely playing also a role in decreasing his  platelets count. -Continue to avoid heparin products   Worsening anemia -Anemia panel with some iron deficiency, but hold iron supplementation in light  of pneumonia -Transfuse for hemoglobin less than 7     DVT prophylaxis: SCDs, ambulating Code Status: Full Family Communication: Wife at bedside 7/28 Disposition Plan:  Status is: Inpatient Remains inpatient appropriate because: IV medications     Consultants:  Pulmonology   Procedures:  None   Antimicrobials:  Anti-infectives (From admission, onward)    Start     Dose/Rate Route Frequency Ordered Stop   12/09/21 1400  cefTRIAXone (ROCEPHIN) 2 g in sodium chloride 0.9 % 100 mL IVPB        2 g 200 mL/hr over 30 Minutes Intravenous Every 24 hours 12/09/21 0752 12/10/21 1536   12/04/21 1800  cefTRIAXone (ROCEPHIN) 2 g in sodium chloride 0.9 % 100 mL IVPB        2 g 200 mL/hr over 30 Minutes Intravenous Every 24 hours 12/04/21 1245 12/08/21 1747   12/04/21 1300  azithromycin (ZITHROMAX) 500 mg in sodium chloride 0.9 % 250 mL IVPB        500 mg 250 mL/hr over 60 Minutes Intravenous Every 24 hours 12/04/21 1245 12/08/21 1447   12/04/21 1000  ceFEPIme (MAXIPIME) 2 g in sodium chloride 0.9 % 100 mL IVPB        2 g 200 mL/hr over 30 Minutes Intravenous  Once 12/04/21 0955 12/04/21 1055   12/04/21 1000  vancomycin (VANCOCIN) IVPB 1000 mg/200 mL premix        1,000 mg 200 mL/hr over 60 Minutes Intravenous  Once 12/04/21 0955 12/04/21 1158   12/04/21 0945  metroNIDAZOLE (FLAGYL) IVPB 500 mg        500 mg 100 mL/hr over 60 Minutes Intravenous  Once 12/04/21 0941 12/04/21 1055       Subjective: Patient seen and evaluated today with some impatience noted and is complaining of soreness.  He would like to walk in the hallways and is eager to know when he could be discharged.  Objective: Vitals:   12/11/21 0300 12/11/21 0446 12/11/21 0720 12/11/21 0724  BP: 119/71     Pulse: (!) 102     Resp: 18     Temp: 98.6 F (37 C)      TempSrc: Oral     SpO2: 97%  97% 97%  Weight:  54.7 kg    Height:        Intake/Output Summary (Last 24 hours) at 12/11/2021 1127 Last data filed at 12/11/2021 0900 Gross per 24 hour  Intake 780 ml  Output 1350 ml  Net -570 ml   Filed Weights   12/09/21 0455 12/10/21 0437 12/11/21 0446  Weight: 55.5 kg 54.7 kg 54.7 kg    Examination:  General exam: Appears calm and comfortable  Respiratory system: Clear to auscultation. Respiratory effort normal.  Continues on 2 L nasal cannula oxygen Cardiovascular system: S1 & S2 heard, RRR.  Gastrointestinal system: Abdomen is soft Central nervous system: Alert and awake Extremities: No edema Skin: No significant lesions noted Psychiatry: Flat affect.    Data Reviewed: I have personally reviewed following labs and imaging studies  CBC: Recent Labs  Lab 12/07/21 0527 12/08/21 0521 12/09/21 0505 12/10/21 0523 12/11/21 0555  WBC 15.8* 20.6* 23.4* 21.5* 21.4*  HGB 8.0* 7.8* 7.5* 7.3* 7.5*  HCT 22.6* 21.6* 21.7* 21.2* 23.0*  MCV 84.6 83.4 85.4 86.5 88.8  PLT 112* 177 222 270 017   Basic Metabolic Panel: Recent Labs  Lab 12/05/21 0418 12/06/21 0414 12/07/21 0527 12/08/21 0521 12/09/21 0505 12/10/21 0523 12/11/21 0555  NA 135 134* 135  135 133* 134* 134*  K 2.4* 2.5* 3.4* 3.0* 3.4* 3.5 4.0  CL 104 101 102 101 102 104 105  CO2 '26 26 25 27 25 26 24  ' GLUCOSE 101* 108* 74 94 107* 110* 97  BUN 6 5* 6 7 5* 5* <5*  CREATININE 0.43* 0.33* 0.45* 0.48* 0.40* 0.39* 0.38*  CALCIUM 7.6* 8.2* 8.5* 8.2* 8.0* 8.1* 8.2*  MG 1.7 1.8  --  1.5* 1.8 1.6* 1.7  PHOS 2.1* <1.0* 2.6  --   --   --   --    GFR: Estimated Creatinine Clearance: 76 mL/min (A) (by C-G formula based on SCr of 0.38 mg/dL (L)). Liver Function Tests: Recent Labs  Lab 12/05/21 0418 12/06/21 0414 12/10/21 0523  AST 65* 50* 39  ALT 38 36 23  ALKPHOS 139* 163* 172*  BILITOT 3.2* 3.3* 1.2  PROT 5.2* 5.7* 5.8*  ALBUMIN 1.8* 1.9* 1.8*   No results for input(s):  "LIPASE", "AMYLASE" in the last 168 hours. No results for input(s): "AMMONIA" in the last 168 hours. Coagulation Profile: No results for input(s): "INR", "PROTIME" in the last 168 hours. Cardiac Enzymes: No results for input(s): "CKTOTAL", "CKMB", "CKMBINDEX", "TROPONINI" in the last 168 hours. BNP (last 3 results) No results for input(s): "PROBNP" in the last 8760 hours. HbA1C: No results for input(s): "HGBA1C" in the last 72 hours. CBG: No results for input(s): "GLUCAP" in the last 168 hours. Lipid Profile: No results for input(s): "CHOL", "HDL", "LDLCALC", "TRIG", "CHOLHDL", "LDLDIRECT" in the last 72 hours. Thyroid Function Tests: No results for input(s): "TSH", "T4TOTAL", "FREET4", "T3FREE", "THYROIDAB" in the last 72 hours. Anemia Panel: Recent Labs    12/10/21 0523  VITAMINB12 1,124*  FOLATE 12.9  FERRITIN 651*  TIBC 124*  IRON 20*  RETICCTPCT 3.2*   Sepsis Labs: Recent Labs  Lab 12/04/21 1134 12/05/21 2154 12/06/21 0414 12/07/21 1308 12/08/21 0521 12/09/21 0505  PROCALCITON  --   --   --  3.93 3.38 2.07  LATICACIDVEN 3.2* 4.2* 1.4  --   --   --     Recent Results (from the past 240 hour(s))  C Difficile Quick Screen w PCR reflex     Status: None   Collection Time: 12/04/21  9:25 AM   Specimen: STOOL  Result Value Ref Range Status   C Diff antigen NEGATIVE NEGATIVE Final   C Diff toxin NEGATIVE NEGATIVE Final   C Diff interpretation No C. difficile detected.  Final    Comment: Performed at [facility], [address] [place], [place] [address]  Resp Panel by RT-PCR (Flu A&B, Covid) Anterior Nasal Swab     Status: None   Collection Time: 12/04/21  9:37 AM   Specimen: Anterior Nasal Swab  Result Value Ref Range Status   SARS Coronavirus 2 by RT PCR NEGATIVE NEGATIVE Final    Comment: (NOTE) SARS-CoV-2 target nucleic acids are NOT DETECTED.  The SARS-CoV-2 RNA is generally detectable in upper respiratory specimens during the acute phase of infection. The  lowest concentration of SARS-CoV-2 viral copies this assay can detect is 138 copies/mL. A negative result does not preclude SARS-Cov-2 infection and should not be used as the sole basis for treatment or other patient management decisions. A negative result may occur with  improper specimen collection/handling, submission of specimen other than nasopharyngeal swab, presence of viral mutation(s) within the areas targeted by this assay, and inadequate number of viral copies(<138 copies/mL). A negative result must be combined with clinical observations, patient history,  and epidemiological information. The expected result is Negative.  Fact Sheet for Patients:  EntrepreneurPulse.com.au  Fact Sheet for Healthcare Providers:  IncredibleEmployment.be  This test is no t yet approved or cleared by the [place] FDA and  has been authorized for detection and/or diagnosis of SARS-CoV-2 by FDA under an Emergency Use Authorization (EUA). This EUA will remain  in effect (meaning this test can be used) for the duration of the COVID-19 declaration under Section 564(b)(1) of the Act, 21 U.S.C.section 360bbb-3(b)(1), unless the authorization is terminated  or revoked sooner.       Influenza A by PCR NEGATIVE NEGATIVE Final   Influenza B by PCR NEGATIVE NEGATIVE Final    Comment: (NOTE) The Xpert Xpress SARS-CoV-2/FLU/RSV plus assay is intended as an aid in the diagnosis of influenza from Nasopharyngeal swab specimens and should not be used as a sole basis for treatment. Nasal washings and aspirates are unacceptable for Xpert Xpress SARS-CoV-2/FLU/RSV testing.  Fact Sheet for Patients: EntrepreneurPulse.com.au  Fact Sheet for Healthcare Providers: IncredibleEmployment.be  This test is not yet approved or cleared by the [place] FDA and has been authorized for detection and/or diagnosis of SARS-CoV-2 by FDA under  an Emergency Use Authorization (EUA). This EUA will remain in effect (meaning this test can be used) for the duration of the COVID-19 declaration under Section 564(b)(1) of the Act, 21 U.S.C. section 360bbb-3(b)(1), unless the authorization is terminated or revoked.  Performed at [facility], [address], [place], [place] [address]   Culture, blood (Routine x 2)     Status: None   Collection Time: 12/04/21  9:42 AM   Specimen: Left Antecubital; Blood  Result Value Ref Range Status   Specimen Description   Final    LEFT ANTECUBITAL BLOOD Performed at [place] [address] [place], [place] [address]    Special Requests   Final    Blood Culture results may not be optimal due to an excessive volume of blood received in culture bottles BOTTLES DRAWN AEROBIC AND ANAEROBIC Performed at [facility], [place] [address], [place], [place] [address]    Culture   Final    NO GROWTH 5 DAYS Performed at [facility], [address], [place], [place] [address]    Report Status 12/09/2021 FINAL  Final  Culture, blood (Routine x 2)     Status: None   Collection Time: 12/04/21  9:43 AM   Specimen: Right Antecubital; Blood  Result Value Ref Range Status   Specimen Description   Final    RIGHT ANTECUBITAL BLOOD Performed at [facility], [place] [address] [place], [place] [address]    Special Requests   Final    Blood Culture adequate volume BOTTLES DRAWN AEROBIC AND ANAEROBIC Performed at [facility], [place] [address] [place], [place] [address]    Culture   Final    NO GROWTH 5 DAYS Performed at [facility], [address], [place], [place] [address]    Report Status 12/09/2021 FINAL  Final  Urine Culture     Status: None   Collection Time: 12/04/21 11:23 AM   Specimen: In/Out Cath Urine  Result Value Ref Range Status   Specimen Description   Final    IN/OUT CATH URINE Performed at [facility], [address] [place], [place] [address]    Special Requests    Final    NONE Performed at [facility], [address] [place], [place] [address]  Culture   Final    NO GROWTH Performed at [facility], [place] [address], [place], [place] [address]    Report Status 12/05/2021 FINAL  Final  Gastrointestinal Panel by PCR , Stool     Status: None   Collection Time: 12/04/21  5:00 PM   Specimen: Stool  Result Value Ref Range Status   Campylobacter species NOT DETECTED NOT DETECTED Final   Plesimonas shigelloides NOT DETECTED NOT DETECTED Final   Salmonella species NOT DETECTED NOT DETECTED Final   Yersinia enterocolitica NOT DETECTED NOT DETECTED Final   Vibrio species NOT DETECTED NOT DETECTED Final   Vibrio cholerae NOT DETECTED NOT DETECTED Final   Enteroaggregative E coli (EAEC) NOT DETECTED NOT DETECTED Final   Enteropathogenic E coli (EPEC) NOT DETECTED NOT DETECTED Final   Enterotoxigenic E coli (ETEC) NOT DETECTED NOT DETECTED Final   Shiga like toxin producing E coli (STEC) NOT DETECTED NOT DETECTED Final   Shigella/Enteroinvasive E coli (EIEC) NOT DETECTED NOT DETECTED Final   Cryptosporidium NOT DETECTED NOT DETECTED Final   Cyclospora cayetanensis NOT DETECTED NOT DETECTED Final   Entamoeba histolytica NOT DETECTED NOT DETECTED Final   Giardia lamblia NOT DETECTED NOT DETECTED Final   Adenovirus F40/41 NOT DETECTED NOT DETECTED Final   Astrovirus NOT DETECTED NOT DETECTED Final   Norovirus GI/GII NOT DETECTED NOT DETECTED Final   Rotavirus A NOT DETECTED NOT DETECTED Final   Sapovirus (I, II, IV, and V) NOT DETECTED NOT DETECTED Final    Comment: Performed at [facility], [address] [place], [place] [address]  MRSA Next Gen by PCR, Nasal     Status: None   Collection Time: 12/04/21  6:02 PM   Specimen: STOOL; Nasal Swab  Result Value Ref Range Status   MRSA by PCR Next Gen NOT DETECTED NOT DETECTED Final    Comment: (NOTE) The GeneXpert MRSA Assay (FDA approved for NASAL specimens only), is one component of a  comprehensive MRSA colonization surveillance program. It is not intended to diagnose MRSA infection nor to guide or monitor treatment for MRSA infections. Test performance is not FDA approved in patients less than 24 years old. Performed at [facility], [address], [place], [place] [address]          Radiology Studies: DG Chest 2 View  Result Date: 12/11/2021 CLINICAL DATA:  Pneumonia. EXAM: CHEST - 2 VIEW COMPARISON:  December 04, 2021 chest x-ray FINDINGS: Worsening infiltrate in the right middle lobe and right base. Small right pleural effusion. The heart, hila, mediastinum, lungs, and pleura are otherwise unremarkable. IMPRESSION: Worsening right-sided pneumonia. Recommend follow-up to complete resolution. Electronically Signed   By: Dorise Bullion III M.D.   On: 12/11/2021 09:40        Scheduled Meds:  budesonide (PULMICORT) nebulizer solution  0.5 mg Nebulization BID   Chlorhexidine Gluconate Cloth  6 each Topical Daily   dextromethorphan-guaiFENesin  1 tablet Oral BID   folic acid  1 mg Oral Daily   ipratropium-albuterol  3 mL Nebulization TID   multivitamin with minerals  1 tablet Oral Daily   nicotine  14 mg Transdermal Daily   potassium chloride  40 mEq Oral BID   thiamine  100 mg Oral Daily   Or   thiamine  100 mg Intravenous Daily     LOS: 7 days    Time spent: 35 minutes    Climmie Cronce Darleen Crocker, DO [facility]  If 7PM-7AM, please contact night-coverage [email or web address] 12/11/2021,  11:27 AM

## 2021-12-12 DIAGNOSIS — R6520 Severe sepsis without septic shock: Secondary | ICD-10-CM | POA: Diagnosis not present

## 2021-12-12 DIAGNOSIS — A419 Sepsis, unspecified organism: Secondary | ICD-10-CM | POA: Diagnosis not present

## 2021-12-12 LAB — COMPREHENSIVE METABOLIC PANEL
ALT: 58 U/L — ABNORMAL HIGH (ref 0–44)
AST: 108 U/L — ABNORMAL HIGH (ref 15–41)
Albumin: 2.4 g/dL — ABNORMAL LOW (ref 3.5–5.0)
Alkaline Phosphatase: 185 U/L — ABNORMAL HIGH (ref 38–126)
Anion gap: 10 (ref 5–15)
BUN: 8 mg/dL (ref 6–20)
CO2: 25 mmol/L (ref 22–32)
Calcium: 7.7 mg/dL — ABNORMAL LOW (ref 8.9–10.3)
Chloride: 95 mmol/L — ABNORMAL LOW (ref 98–111)
Creatinine, Ser: 0.65 mg/dL (ref 0.61–1.24)
GFR, Estimated: >60 mL/min (ref >60)
Glucose, Bld: 81 mg/dL (ref 70–99)
Potassium: 2.7 mmol/L — CL (ref 3.5–5.1)
Sodium: 130 mmol/L — ABNORMAL LOW (ref 135–145)
Total Bilirubin: 4.5 mg/dL — ABNORMAL HIGH (ref 0.3–1.2)
Total Protein: 6.6 g/dL (ref 6.5–8.1)

## 2021-12-12 LAB — CBC
HCT: 25.1 % — ABNORMAL LOW (ref 39.0–52.0)
Hemoglobin: 8.2 g/dL — ABNORMAL LOW (ref 13.0–17.0)
MCH: 29.2 pg (ref 26.0–34.0)
MCHC: 32.7 g/dL (ref 30.0–36.0)
MCV: 89.3 fL (ref 80.0–100.0)
Platelets: 401 10*3/uL — ABNORMAL HIGH (ref 150–400)
RBC: 2.81 MIL/uL — ABNORMAL LOW (ref 4.22–5.81)
RDW: 18 % — ABNORMAL HIGH (ref 11.5–15.5)
WBC: 15 10*3/uL — ABNORMAL HIGH (ref 4.0–10.5)
nRBC: 0.3 % — ABNORMAL HIGH (ref 0.0–0.2)

## 2021-12-12 LAB — SEDIMENTATION RATE: Sed Rate: 105 mm/hr — ABNORMAL HIGH (ref 0–16)

## 2021-12-12 LAB — LACTIC ACID, PLASMA: Lactic Acid, Venous: 2.9 mmol/L (ref 0.5–1.9)

## 2021-12-12 LAB — BASIC METABOLIC PANEL
Anion gap: 6 (ref 5–15)
BUN: 5 mg/dL — ABNORMAL LOW (ref 6–20)
CO2: 26 mmol/L (ref 22–32)
Calcium: 8.2 mg/dL — ABNORMAL LOW (ref 8.9–10.3)
Chloride: 103 mmol/L (ref 98–111)
Creatinine, Ser: 0.47 mg/dL — ABNORMAL LOW (ref 0.61–1.24)
GFR, Estimated: >60 mL/min (ref >60)
Glucose, Bld: 99 mg/dL (ref 70–99)
Potassium: 3.9 mmol/L (ref 3.5–5.1)
Sodium: 135 mmol/L (ref 135–145)

## 2021-12-12 LAB — MAGNESIUM: Magnesium: 1.6 mg/dL — ABNORMAL LOW (ref 1.7–2.4)

## 2021-12-12 MED ORDER — GUAIFENESIN 100 MG/5ML PO LIQD: 5.0000 mL | Freq: Four times a day (QID) | ORAL | 0 refills | Status: DC | PRN

## 2021-12-12 MED ORDER — AMOXICILLIN-POT CLAVULANATE 500-125 MG PO TABS: 1.0000 | ORAL_TABLET | Freq: Three times a day (TID) | ORAL | 0 refills | Status: AC

## 2021-12-12 MED ORDER — COMBIVENT RESPIMAT 20-100 MCG/ACT IN AERS: 1.0000 | INHALATION_SPRAY | Freq: Four times a day (QID) | RESPIRATORY_TRACT | 1 refills | Status: DC | PRN

## 2021-12-12 MED ORDER — MAGNESIUM SULFATE 2 GM/50ML IV SOLN
2.0000 g | Freq: Once | INTRAVENOUS | Status: AC
Start: 2021-12-12 — End: 2021-12-12
  Administered 2021-12-12: 2 g via INTRAVENOUS
  Filled 2021-12-12: qty 50

## 2021-12-12 NOTE — Discharge Summary (Signed)
Physician Discharge Summary  Nicholas Avila OZH:[id number] DOB: [birth year] DOA: 12/04/2021  PCP: [name], MD  Admit date: 12/04/2021  Discharge date: 12/12/2021  Admitted From:Home  Disposition:  Home  Recommendations for Outpatient Follow-up:  Follow up with PCP in 1-2 weeks Follow-up with pulmonology as recommended in 2 weeks with Dr. Melvyn Novas Continue on Augmentin for 3 more days to complete total 10-day course of treatment Combivent prescribed as needed for any shortness of breath or wheezing Continue other home medications as prior  Home Health: None  Equipment/Devices: None  Discharge Condition:Stable  CODE STATUS: Full  Diet recommendation: Heart Healthy  Brief/Interim Summary: Nicholas Avila is a 61 y.o. male with medical history significant of history of alcoholism in remission, tobacco abuse, prior history of transient atrial flutter/atrial fibrillation and hypertension; who presented to the hospital secondary to diarrhea, chills general malaise.  He was admitted for severe sepsis, present on admission secondary to right lower lobe pneumonia associated with a small parapneumonic effusion.  He had persistent leukocytosis and tachycardia with poor progression while on IV antibiotics during his stay and therefore pulmonology was eventually consulted to assist with management with recommendations to continue on IV antibiotics and repeat chest imaging.  He slowly improved over the next several days and has been weaned off of oxygen with no further requirements at this time.  He is currently in stable condition for discharge and will need to remain on Augmentin for 3 more days to complete total 10-day course of treatment.  No other acute concerns or events noted and he will need to follow-up with pulmonology outpatient.  Discharge Diagnoses:  Principal Problem:   Severe sepsis ([place]) Active Problems:   Tobacco abuse   Alcoholism in remission ([place])   Diarrhea    Elevated LFTs   Hypokalemia   Hypomagnesemia   HTN (hypertension)  Principal discharge diagnosis: Severe sepsis, present on admission secondary to strep pneumo community-acquired pneumonia.  Acute hypoxemic respiratory failure.  Discharge Instructions  Discharge Instructions     Diet - low sodium heart healthy   Complete by: As directed    Increase activity slowly   Complete by: As directed       Allergies as of 12/12/2021   No Known Allergies      Medication List     TAKE these medications    acetaminophen 500 MG tablet Commonly known as: TYLENOL Take 1,000 mg by mouth every 6 (six) hours as needed for moderate pain.   amLODipine 10 MG tablet Commonly known as: NORVASC Take 10 mg by mouth daily.   amoxicillin-clavulanate 500-125 MG tablet Commonly known as: Augmentin Take 1 tablet (500 mg total) by mouth 3 (three) times daily for 3 days.   Combivent Respimat 20-100 MCG/ACT Aers respimat Generic drug: Ipratropium-Albuterol Inhale 1 puff into the lungs every 6 (six) hours as needed for wheezing or shortness of breath.   guaiFENesin 100 MG/5ML liquid Commonly known as: ROBITUSSIN Take 5 mLs by mouth every 6 (six) hours as needed for cough or to loosen phlegm.   HYDROcodone-acetaminophen 5-325 MG tablet Commonly known as: NORCO/VICODIN One tablet every six hours for pain.  Limit 7 days. What changed:  how much to take how to take this when to take this reasons to take this additional instructions   polyethylene glycol-electrolytes 420 g solution Commonly known as: TriLyte Take 4,000 mLs by mouth as directed.   sildenafil 100 MG tablet Commonly known as: VIAGRA Take 100 mg by mouth daily as  needed.        Follow-up Information     Fanta, Normajean Baxter, MD. Schedule an appointment as soon as possible for a visit in 1 week(s).   Specialty: Internal Medicine Contact information: [place] [address] [phone]                 No Known Allergies  Consultations: Pulmonology   Procedures/Studies: DG Chest 2 View  Result Date: 12/11/2021 CLINICAL DATA:  Pneumonia. EXAM: CHEST - 2 VIEW COMPARISON:  December 04, 2021 chest x-ray FINDINGS: Worsening infiltrate in the right middle lobe and right base. Small right pleural effusion. The heart, hila, mediastinum, lungs, and pleura are otherwise unremarkable. IMPRESSION: Worsening right-sided pneumonia. Recommend follow-up to complete resolution. Electronically Signed   By: Dorise Bullion III M.D.   On: 12/11/2021 09:40   CT CHEST WO CONTRAST  Result Date: 12/08/2021 CLINICAL DATA:  Follow-up pneumonia. EXAM: CT CHEST WITHOUT CONTRAST TECHNIQUE: Multidetector CT imaging of the chest was performed following the standard protocol without IV contrast. RADIATION DOSE REDUCTION: This exam was performed according to the departmental dose-optimization program which includes automated exposure control, adjustment of the mA and/or kV according to patient size and/or use of iterative reconstruction technique. COMPARISON:  Prior chest CT 08/06/2020 and recent chest x-ray 12/04/2021 FINDINGS: Cardiovascular: The heart is normal in size. No pericardial effusion. The aorta is normal in caliber. Stable age advanced atherosclerotic calcifications. Stable significant age advanced three-vessel coronary artery calcifications. Mediastinum/Nodes: Borderline enlarged mediastinal and hilar lymph nodes, likely reactive given the lung findings. The esophagus is grossly normal. Lungs/Pleura: Underlying emphysematous changes again noted. Fairly extensive right lower lobe airspace process with consolidation, surrounding ground-glass opacity and air bronchograms. Similar findings also noted in the right middle lobe. A few other patchy areas of inflammation or infection are noted in both lungs. No worrisome pulmonary lesions. There is a small right-sided parapneumonic effusion. The tracheobronchial  tree is unremarkable. No findings for endobronchial lesion or obstruction. Prominent right hilar nodes likely reactive. Upper Abdomen: No significant upper abdominal findings. There is a stable 15 mm left adrenal gland nodule. This measures 14 Hounsfield units and is unchanged since a prior CT scan from 2020. This is consistent with a benign adenoma. Musculoskeletal: No chest wall mass, supraclavicular or axillary adenopathy. The bony thorax is intact. The bony thorax is intact. IMPRESSION: 1. Fairly extensive right lower lobe and right middle lobe airspace consolidation and air bronchograms consistent with pneumonia. 2. A few other patchy areas of inflammation or infection are noted in both lungs. 3. Small right-sided parapneumonic effusion. 4. Borderline enlarged mediastinal and right hilar lymph nodes, likely reactive given the lung findings. 5. Stable age advanced atherosclerotic calcifications involving the aorta and coronary arteries. 6. Stable left adrenal gland adenoma. Aortic Atherosclerosis (ICD10-I70.0) and Emphysema (ICD10-J43.9). Electronically Signed   By: Marijo Sanes M.D.   On: 12/08/2021 10:55   DG Chest 2 View  Result Date: 12/04/2021 CLINICAL DATA:  Suspected sepsis EXAM: CHEST - 2 VIEW COMPARISON:  07/27/2020 FINDINGS: Airspace disease in the right middle and lower lobes. No edema, effusion, or pneumothorax. Nipple shadows based on on CT from 2022. Normal heart size and mediastinal contours. IMPRESSION: Pneumonia at the right lung base. Followup PA and lateral chest X-ray is recommended in 3-4 weeks following trial of antibiotic therapy to ensure resolution. Electronically Signed   By: Jorje Guild M.D.   On: 12/04/2021 10:17     Discharge Exam: Vitals:   12/12/21  0759 12/12/21 0802  BP:    Pulse:    Resp:    Temp:    SpO2: 97% 100%   Vitals:   12/12/21 0300 12/12/21 0700 12/12/21 0759 12/12/21 0802  BP:  128/78    Pulse:  92    Resp:  20    Temp:  99.1 F (37.3 C)     TempSrc:  Oral    SpO2:  100% 97% 100%  Weight: 53.1 kg     Height:        General: Pt is alert, awake, not in acute distress Cardiovascular: RRR, S1/S2 +, no rubs, no gallops Respiratory: CTA bilaterally, no wheezing, no rhonchi Abdominal: Soft, NT, ND, bowel sounds + Extremities: no edema, no cyanosis    The results of significant diagnostics from this hospitalization (including imaging, microbiology, ancillary and laboratory) are listed below for reference.     Microbiology: Recent Results (from the past 240 hour(s))  C Difficile Quick Screen w PCR reflex     Status: None   Collection Time: 12/04/21  9:25 AM   Specimen: STOOL  Result Value Ref Range Status   C Diff antigen NEGATIVE NEGATIVE Final   C Diff toxin NEGATIVE NEGATIVE Final   C Diff interpretation No C. difficile detected.  Final    Comment: Performed at [facility], [address], [place], [place] [address]  Resp Panel by RT-PCR (Flu A&B, Covid) Anterior Nasal Swab     Status: None   Collection Time: 12/04/21  9:37 AM   Specimen: Anterior Nasal Swab  Result Value Ref Range Status   SARS Coronavirus 2 by RT PCR NEGATIVE NEGATIVE Final    Comment: (NOTE) SARS-CoV-2 target nucleic acids are NOT DETECTED.  The SARS-CoV-2 RNA is generally detectable in upper respiratory specimens during the acute phase of infection. The lowest concentration of SARS-CoV-2 viral copies this assay can detect is 138 copies/mL. A negative result does not preclude SARS-Cov-2 infection and should not be used as the sole basis for treatment or other patient management decisions. A negative result may occur with  improper specimen collection/handling, submission of specimen other than nasopharyngeal swab, presence of viral mutation(s) within the areas targeted by this assay, and inadequate number of viral copies(<138 copies/mL). A negative result must be combined with clinical observations, patient history, and  epidemiological information. The expected result is Negative.  Fact Sheet for Patients:  EntrepreneurPulse.com.au  Fact Sheet for Healthcare Providers:  IncredibleEmployment.be  This test is no t yet approved or cleared by the [place] FDA and  has been authorized for detection and/or diagnosis of SARS-CoV-2 by FDA under an Emergency Use Authorization (EUA). This EUA will remain  in effect (meaning this test can be used) for the duration of the COVID-19 declaration under Section 564(b)(1) of the Act, 21 U.S.C.section 360bbb-3(b)(1), unless the authorization is terminated  or revoked sooner.       Influenza A by PCR NEGATIVE NEGATIVE Final   Influenza B by PCR NEGATIVE NEGATIVE Final    Comment: (NOTE) The Xpert Xpress SARS-CoV-2/FLU/RSV plus assay is intended as an aid in the diagnosis of influenza from Nasopharyngeal swab specimens and should not be used as a sole basis for treatment. Nasal washings and aspirates are unacceptable for Xpert Xpress SARS-CoV-2/FLU/RSV testing.  Fact Sheet for Patients: EntrepreneurPulse.com.au  Fact Sheet for Healthcare Providers: IncredibleEmployment.be  This test is not yet approved or cleared by the [place] FDA and has been authorized for detection and/or diagnosis of SARS-CoV-2 by  FDA under an Emergency Use Authorization (EUA). This EUA will remain in effect (meaning this test can be used) for the duration of the COVID-19 declaration under Section 564(b)(1) of the Act, 21 U.S.C. section 360bbb-3(b)(1), unless the authorization is terminated or revoked.  Performed at [facility], [address] [place], [place] [address]   Culture, blood (Routine x 2)     Status: None   Collection Time: 12/04/21  9:42 AM   Specimen: Left Antecubital; Blood  Result Value Ref Range Status   Specimen Description   Final    LEFT ANTECUBITAL BLOOD Performed at [place] [address] [place], [place] [address]    Special Requests   Final    Blood Culture results may not be optimal due to an excessive volume of blood received in culture bottles BOTTLES DRAWN AEROBIC AND ANAEROBIC Performed at [facility], [place] [address], [place], [place] [address]    Culture   Final    NO GROWTH 5 DAYS Performed at [facility], [address], [place], [place] [address]    Report Status 12/09/2021 FINAL  Final  Culture, blood (Routine x 2)     Status: None   Collection Time: 12/04/21  9:43 AM   Specimen: Right Antecubital; Blood  Result Value Ref Range Status   Specimen Description   Final    RIGHT ANTECUBITAL BLOOD Performed at [facility], [place] [address], [place], [place] [address]    Special Requests   Final    Blood Culture adequate volume BOTTLES DRAWN AEROBIC AND ANAEROBIC Performed at [facility], [place] [address] [place], [place] [address]    Culture   Final    NO GROWTH 5 DAYS Performed at [facility], [address], [place], [place] [address]    Report Status 12/09/2021 FINAL  Final  Urine Culture     Status: None   Collection Time: 12/04/21 11:23 AM   Specimen: In/Out Cath Urine  Result Value Ref Range Status   Specimen Description   Final    IN/OUT CATH URINE Performed at [facility], [address] [place], [place] [address]    Special Requests   Final    NONE Performed at [facility], [address] [place], [place] [address]    Culture   Final    NO GROWTH Performed at [facility], [place] [address] [place], [place] [address]    Report Status 12/05/2021 FINAL  Final  Gastrointestinal Panel by PCR , Stool     Status: None   Collection Time: 12/04/21  5:00 PM   Specimen: Stool  Result Value Ref Range Status   Campylobacter species NOT DETECTED NOT DETECTED Final   Plesimonas shigelloides NOT DETECTED NOT DETECTED Final   Salmonella species NOT DETECTED NOT DETECTED Final   Yersinia enterocolitica  NOT DETECTED NOT DETECTED Final   Vibrio species NOT DETECTED NOT DETECTED Final   Vibrio cholerae NOT DETECTED NOT DETECTED Final   Enteroaggregative E coli (EAEC) NOT DETECTED NOT DETECTED Final   Enteropathogenic E coli (EPEC) NOT DETECTED NOT DETECTED Final   Enterotoxigenic E coli (ETEC) NOT DETECTED NOT DETECTED Final   Shiga like toxin producing E coli (STEC) NOT DETECTED NOT DETECTED Final   Shigella/Enteroinvasive E coli (EIEC) NOT DETECTED NOT DETECTED Final   Cryptosporidium NOT DETECTED NOT DETECTED Final   Cyclospora cayetanensis NOT DETECTED NOT DETECTED Final   Entamoeba histolytica NOT DETECTED NOT DETECTED Final  Giardia lamblia NOT DETECTED NOT DETECTED Final   Adenovirus F40/41 NOT DETECTED NOT DETECTED Final   Astrovirus NOT DETECTED NOT DETECTED Final   Norovirus GI/GII NOT DETECTED NOT DETECTED Final   Rotavirus A NOT DETECTED NOT DETECTED Final   Sapovirus (I, II, IV, and V) NOT DETECTED NOT DETECTED Final    Comment: Performed at [facility], [address] [place], [place] [address]  MRSA Next Gen by PCR, Nasal     Status: None   Collection Time: 12/04/21  6:02 PM   Specimen: STOOL; Nasal Swab  Result Value Ref Range Status   MRSA by PCR Next Gen NOT DETECTED NOT DETECTED Final    Comment: (NOTE) The GeneXpert MRSA Assay (FDA approved for NASAL specimens only), is one component of a comprehensive MRSA colonization surveillance program. It is not intended to diagnose MRSA infection nor to guide or monitor treatment for MRSA infections. Test performance is not FDA approved in patients less than 9 years old. Performed at [facility], [address], [place], [place] [address]      Labs: BNP (last 3 results) No results for input(s): "BNP" in the last 8760 hours. Basic Metabolic Panel: Recent Labs  Lab 12/06/21 0414 12/07/21 0527 12/08/21 0521 12/09/21 0505 12/10/21 0523 12/11/21 0555 12/12/21 0559  NA 134* 135 135 133* 134* 134* 135  K  2.5* 3.4* 3.0* 3.4* 3.5 4.0 3.9  CL 101 102 101 102 104 105 103  CO2 '26 25 27 25 26 24 26  '$ GLUCOSE 108* 74 94 107* 110* 97 99  BUN 5* 6 7 5* 5* <5* 5*  CREATININE 0.33* 0.45* 0.48* 0.40* 0.39* 0.38* 0.47*  CALCIUM 8.2* 8.5* 8.2* 8.0* 8.1* 8.2* 8.2*  MG 1.8  --  1.5* 1.8 1.6* 1.7 1.6*  PHOS <1.0* 2.6  --   --   --   --   --    Liver Function Tests: Recent Labs  Lab 12/06/21 0414 12/10/21 0523  AST 50* 39  ALT 36 23  ALKPHOS 163* 172*  BILITOT 3.3* 1.2  PROT 5.7* 5.8*  ALBUMIN 1.9* 1.8*   No results for input(s): "LIPASE", "AMYLASE" in the last 168 hours. No results for input(s): "AMMONIA" in the last 168 hours. CBC: Recent Labs  Lab 12/08/21 0521 12/09/21 0505 12/10/21 0523 12/11/21 0555 12/12/21 0559  WBC 20.6* 23.4* 21.5* 21.4* 15.0*  HGB 7.8* 7.5* 7.3* 7.5* 8.2*  HCT 21.6* 21.7* 21.2* 23.0* 25.1*  MCV 83.4 85.4 86.5 88.8 89.3  PLT 177 222 270 347 401*   Cardiac Enzymes: No results for input(s): "CKTOTAL", "CKMB", "CKMBINDEX", "TROPONINI" in the last 168 hours. BNP: Invalid input(s): "POCBNP" CBG: No results for input(s): "GLUCAP" in the last 168 hours. D-Dimer No results for input(s): "DDIMER" in the last 72 hours. Hgb A1c No results for input(s): "HGBA1C" in the last 72 hours. Lipid Profile No results for input(s): "CHOL", "HDL", "LDLCALC", "TRIG", "CHOLHDL", "LDLDIRECT" in the last 72 hours. Thyroid function studies No results for input(s): "TSH", "T4TOTAL", "T3FREE", "THYROIDAB" in the last 72 hours.  Invalid input(s): "FREET3" Anemia work up Recent Labs    12/10/21 0523  VITAMINB12 1,124*  FOLATE 12.9  FERRITIN 651*  TIBC 124*  IRON 20*  RETICCTPCT 3.2*   Urinalysis    Component Value Date/Time   COLORURINE YELLOW 12/04/2021 1123   APPEARANCEUR CLEAR 12/04/2021 1123   LABSPEC 1.002 (L) 12/04/2021 1123   PHURINE 7.0 12/04/2021 1123   GLUCOSEU NEGATIVE 12/04/2021 1123   HGBUR SMALL (  A) 12/04/2021 1123   BILIRUBINUR NEGATIVE 12/04/2021 1123    KETONESUR NEGATIVE 12/04/2021 1123   PROTEINUR NEGATIVE 12/04/2021 1123   NITRITE NEGATIVE 12/04/2021 1123   LEUKOCYTESUR NEGATIVE 12/04/2021 1123   Sepsis Labs Recent Labs  Lab 12/09/21 0505 12/10/21 0523 12/11/21 0555 12/12/21 0559  WBC 23.4* 21.5* 21.4* 15.0*   Microbiology Recent Results (from the past 240 hour(s))  C Difficile Quick Screen w PCR reflex     Status: None   Collection Time: 12/04/21  9:25 AM   Specimen: STOOL  Result Value Ref Range Status   C Diff antigen NEGATIVE NEGATIVE Final   C Diff toxin NEGATIVE NEGATIVE Final   C Diff interpretation No C. difficile detected.  Final    Comment: Performed at [facility], [address] [place], [place] [address]  Resp Panel by RT-PCR (Flu A&B, Covid) Anterior Nasal Swab     Status: None   Collection Time: 12/04/21  9:37 AM   Specimen: Anterior Nasal Swab  Result Value Ref Range Status   SARS Coronavirus 2 by RT PCR NEGATIVE NEGATIVE Final    Comment: (NOTE) SARS-CoV-2 target nucleic acids are NOT DETECTED.  The SARS-CoV-2 RNA is generally detectable in upper respiratory specimens during the acute phase of infection. The lowest concentration of SARS-CoV-2 viral copies this assay can detect is 138 copies/mL. A negative result does not preclude SARS-Cov-2 infection and should not be used as the sole basis for treatment or other patient management decisions. A negative result may occur with  improper specimen collection/handling, submission of specimen other than nasopharyngeal swab, presence of viral mutation(s) within the areas targeted by this assay, and inadequate number of viral copies(<138 copies/mL). A negative result must be combined with clinical observations, patient history, and epidemiological information. The expected result is Negative.  Fact Sheet for Patients:  EntrepreneurPulse.com.au  Fact Sheet for Healthcare Providers:  IncredibleEmployment.be  This  test is no t yet approved or cleared by the [place] FDA and  has been authorized for detection and/or diagnosis of SARS-CoV-2 by FDA under an Emergency Use Authorization (EUA). This EUA will remain  in effect (meaning this test can be used) for the duration of the COVID-19 declaration under Section 564(b)(1) of the Act, 21 U.S.C.section 360bbb-3(b)(1), unless the authorization is terminated  or revoked sooner.       Influenza A by PCR NEGATIVE NEGATIVE Final   Influenza B by PCR NEGATIVE NEGATIVE Final    Comment: (NOTE) The Xpert Xpress SARS-CoV-2/FLU/RSV plus assay is intended as an aid in the diagnosis of influenza from Nasopharyngeal swab specimens and should not be used as a sole basis for treatment. Nasal washings and aspirates are unacceptable for Xpert Xpress SARS-CoV-2/FLU/RSV testing.  Fact Sheet for Patients: EntrepreneurPulse.com.au  Fact Sheet for Healthcare Providers: IncredibleEmployment.be  This test is not yet approved or cleared by the [place] FDA and has been authorized for detection and/or diagnosis of SARS-CoV-2 by FDA under an Emergency Use Authorization (EUA). This EUA will remain in effect (meaning this test can be used) for the duration of the COVID-19 declaration under Section 564(b)(1) of the Act, 21 U.S.C. section 360bbb-3(b)(1), unless the authorization is terminated or revoked.  Performed at [facility], [address] [place], [place] [address]   Culture, blood (Routine x 2)     Status: None   Collection Time: 12/04/21  9:42 AM   Specimen: Left Antecubital; Blood  Result Value Ref Range Status   Specimen Description   Final  LEFT ANTECUBITAL BLOOD Performed at [facility], [place] [address] [place], [place] [address]    Special Requests   Final    Blood Culture results may not be optimal due to an excessive volume of blood received in culture bottles BOTTLES DRAWN AEROBIC AND  ANAEROBIC Performed at [facility], [place] [address] [place], [place] [address]    Culture   Final    NO GROWTH 5 DAYS Performed at [facility], [address], [place], [place] [address]    Report Status 12/09/2021 FINAL  Final  Culture, blood (Routine x 2)     Status: None   Collection Time: 12/04/21  9:43 AM   Specimen: Right Antecubital; Blood  Result Value Ref Range Status   Specimen Description   Final    RIGHT ANTECUBITAL BLOOD Performed at [facility], [place] [address] [place], [place] [address]    Special Requests   Final    Blood Culture adequate volume BOTTLES DRAWN AEROBIC AND ANAEROBIC Performed at [facility], [place] [address], [place], [place] [address]    Culture   Final    NO GROWTH 5 DAYS Performed at [facility], [address], [place], [place] [address]    Report Status 12/09/2021 FINAL  Final  Urine Culture     Status: None   Collection Time: 12/04/21 11:23 AM   Specimen: In/Out Cath Urine  Result Value Ref Range Status   Specimen Description   Final    IN/OUT CATH URINE Performed at [facility], [address], [place], [place] [address]    Special Requests   Final    NONE Performed at [facility], [address] [place], [place] [address]    Culture   Final    NO GROWTH Performed at [facility], [place] [address] [place], [place] [address]    Report Status 12/05/2021 FINAL  Final  Gastrointestinal Panel by PCR , Stool     Status: None   Collection Time: 12/04/21  5:00 PM   Specimen: Stool  Result Value Ref Range Status   Campylobacter species NOT DETECTED NOT DETECTED Final   Plesimonas shigelloides NOT DETECTED NOT DETECTED Final   Salmonella species NOT DETECTED NOT DETECTED Final   Yersinia enterocolitica NOT DETECTED NOT DETECTED Final   Vibrio species NOT DETECTED NOT DETECTED Final   Vibrio cholerae NOT DETECTED NOT DETECTED Final   Enteroaggregative E coli (EAEC) NOT DETECTED NOT DETECTED Final    Enteropathogenic E coli (EPEC) NOT DETECTED NOT DETECTED Final   Enterotoxigenic E coli (ETEC) NOT DETECTED NOT DETECTED Final   Shiga like toxin producing E coli (STEC) NOT DETECTED NOT DETECTED Final   Shigella/Enteroinvasive E coli (EIEC) NOT DETECTED NOT DETECTED Final   Cryptosporidium NOT DETECTED NOT DETECTED Final   Cyclospora cayetanensis NOT DETECTED NOT DETECTED Final   Entamoeba histolytica NOT DETECTED NOT DETECTED Final   Giardia lamblia NOT DETECTED NOT DETECTED Final   Adenovirus F40/41 NOT DETECTED NOT DETECTED Final   Astrovirus NOT DETECTED NOT DETECTED Final   Norovirus GI/GII NOT DETECTED NOT DETECTED Final   Rotavirus A NOT DETECTED NOT DETECTED Final   Sapovirus (I, II, IV, and V) NOT DETECTED NOT DETECTED Final    Comment: Performed at [facility], [address] [place],  [address]  MRSA Next Gen by PCR, Nasal     Status: None   Collection Time: 12/04/21  6:02 PM   Specimen: STOOL;  Nasal Swab  Result Value Ref Range Status   MRSA by PCR Next Gen NOT DETECTED NOT DETECTED Final    Comment: (NOTE) The GeneXpert MRSA Assay (FDA approved for NASAL specimens only), is one component of a comprehensive MRSA colonization surveillance program. It is not intended to diagnose MRSA infection nor to guide or monitor treatment for MRSA infections. Test performance is not FDA approved in patients less than 78 years old. Performed at [facility], [address], [place], [place] [address]      Time coordinating discharge: 35 minutes  SIGNED:   Rodena Goldmann, DO [facility] 12/12/2021, 10:40 AM  If 7PM-7AM, please contact night-coverage [email or web address]

## 2021-12-12 NOTE — Progress Notes (Signed)
SATURATION QUALIFICATIONS: (This note is used to comply with regulatory documentation for home oxygen)  Patient Saturations on Room Air at Rest = 100%  Patient Saturations on Room Air while Ambulating = 100%  Patient Saturations on 2 Liters of oxygen while Ambulating = 100%  Please briefly explain why patient needs home oxygen: Patients oxygen remained 100 percent after walking to nursing desk and back , his oxygen remained stable to 100%.

## 2021-12-14 ENCOUNTER — Ambulatory Visit (INDEPENDENT_AMBULATORY_CARE_PROVIDER_SITE_OTHER): Payer: BC Managed Care – PPO | Admitting: Orthopaedic Surgery

## 2021-12-14 ENCOUNTER — Encounter: Payer: Self-pay | Admitting: Orthopaedic Surgery

## 2021-12-14 VITALS — BP 122/68 | HR 92 | Ht 71.0 in | Wt 117.0 lb

## 2021-12-14 DIAGNOSIS — F17210 Nicotine dependence, cigarettes, uncomplicated: Secondary | ICD-10-CM | POA: Diagnosis not present

## 2021-12-14 DIAGNOSIS — M545 Low back pain, unspecified: Secondary | ICD-10-CM | POA: Diagnosis not present

## 2021-12-14 DIAGNOSIS — Z8701 Personal history of pneumonia (recurrent): Secondary | ICD-10-CM | POA: Diagnosis not present

## 2021-12-14 DIAGNOSIS — M79605 Pain in left leg: Secondary | ICD-10-CM

## 2021-12-14 LAB — HEPATIC FUNCTION PANEL
AG Ratio: 0.9 (calc) — ABNORMAL LOW (ref 1.0–2.5)
ALT: 82 U/L — ABNORMAL HIGH (ref 9–46)
AST: 187 U/L — ABNORMAL HIGH (ref 10–35)
Albumin: 3.4 g/dL — ABNORMAL LOW (ref 3.6–5.1)
Alkaline phosphatase (APISO): 294 U/L — ABNORMAL HIGH (ref 35–144)
Bilirubin, Direct: 0.8 mg/dL — ABNORMAL HIGH (ref 0.0–0.2)
Globulin: 3.7 g/dL (calc) (ref 1.9–3.7)
Indirect Bilirubin: 1.1 mg/dL (calc) (ref 0.2–1.2)
Total Bilirubin: 1.9 mg/dL — ABNORMAL HIGH (ref 0.2–1.2)
Total Protein: 7.1 g/dL (ref 6.1–8.1)

## 2021-12-14 LAB — IGG: IgG (Immunoglobin G), Serum: 2338 mg/dL — ABNORMAL HIGH (ref 600–1640)

## 2021-12-14 LAB — HEMOCHROMATOSIS DNA-PCR(C282Y,H63D)

## 2021-12-14 MED ORDER — HYDROCODONE-ACETAMINOPHEN 5-325 MG PO TABS
ORAL_TABLET | ORAL | 0 refills | Status: DC
Start: 2021-12-14 — End: 2021-12-28

## 2021-12-14 NOTE — Patient Instructions (Signed)
Follow up in 1 mth weigh patient

## 2021-12-14 NOTE — Progress Notes (Signed)
My back is hurting more.  He was discharged about two weeks ago from hospital after stay for pneumonia.  He is breathing better now.  He has no new episodes.  His lower back is very tender and sore.  He has no new trauma, no weakness, no numbness.  He has lost some weight.  BP 122/68   Pulse 92   Ht '5\' 11"'$  (1.803 m)   Wt 117 lb (53.1 kg)   BMI 16.32 kg/m    Lower back has tightness, decreased ROM, NV intact, SLR negative, muscle strength and tone normal.  Encounter Diagnoses  Name Primary?   Lumbar pain with radiation down left leg Yes   Nicotine dependence, cigarettes, uncomplicated    History of community acquired pneumonia    I will renew pain medicine.  I have reviewed the [place] web site prior to prescribing narcotic medicine for this patient.  I have told him to take Ensure between meals.  I have given walking exercises to do.  He had seen neurosurgeon about his back and epidural were recommended.  He decided to wait on this.  Return in one month.  Call if any problem.  Precautions discussed.  Electronically Signed Sanjuana Kava, MD 8/2/202310:02 AM

## 2021-12-15 ENCOUNTER — Other Ambulatory Visit (HOSPITAL_COMMUNITY): Payer: Self-pay | Admitting: Internal Medicine

## 2021-12-15 ENCOUNTER — Ambulatory Visit (HOSPITAL_COMMUNITY)
Admission: RE | Admit: 2021-12-15 | Discharge: 2021-12-15 | Disposition: A | Discharge status: Home / Self Care | Payer: BC Managed Care – PPO | Source: Ambulatory Visit | Attending: Internal Medicine | Admitting: Internal Medicine

## 2021-12-15 DIAGNOSIS — J189 Pneumonia, unspecified organism: Secondary | ICD-10-CM

## 2021-12-15 DIAGNOSIS — K7030 Alcoholic cirrhosis of liver without ascites: Secondary | ICD-10-CM | POA: Diagnosis not present

## 2021-12-15 DIAGNOSIS — J449 Chronic obstructive pulmonary disease, unspecified: Secondary | ICD-10-CM | POA: Diagnosis not present

## 2021-12-15 DIAGNOSIS — R059 Cough, unspecified: Secondary | ICD-10-CM | POA: Diagnosis not present

## 2021-12-15 DIAGNOSIS — R945 Abnormal results of liver function studies: Secondary | ICD-10-CM | POA: Diagnosis not present

## 2021-12-15 DIAGNOSIS — R918 Other nonspecific abnormal finding of lung field: Secondary | ICD-10-CM | POA: Diagnosis not present

## 2021-12-15 DIAGNOSIS — A419 Sepsis, unspecified organism: Secondary | ICD-10-CM | POA: Diagnosis not present

## 2021-12-15 DIAGNOSIS — F1020 Alcohol dependence, uncomplicated: Secondary | ICD-10-CM | POA: Diagnosis not present

## 2021-12-15 DIAGNOSIS — J90 Pleural effusion, not elsewhere classified: Secondary | ICD-10-CM | POA: Diagnosis not present

## 2021-12-15 DIAGNOSIS — G929 Unspecified toxic encephalopathy: Secondary | ICD-10-CM | POA: Diagnosis not present

## 2021-12-15 DIAGNOSIS — E785 Hyperlipidemia, unspecified: Secondary | ICD-10-CM | POA: Diagnosis not present

## 2021-12-21 ENCOUNTER — Telehealth: Payer: Self-pay | Admitting: Gastroenterology

## 2021-12-21 NOTE — Telephone Encounter (Signed)
I received notification from Verita Lamb that she has been unable to reach patient regarding his liver biopsy.  Loma Sousa -please try reaching out to patient via telephone to remind him to contact radiology about scheduling his liver biopsy.  Please also remind him about his follow-up appointment with me next month.  If you are unable to reach him by telephone, please send a letter containing the number for central scheduling so he can get his liver biopsy completed.   FYI to [place] as she was following his hemochromatosis labs.  [name], MSN, FNP-BC, AGACNP-BC [facility]

## 2021-12-21 NOTE — Progress Notes (Unsigned)
Nicholas Monday, NP  Donita Brooks D Thank you for letting me know. I will have my staff also reach out to him and send him a letter.        Previous Messages    ----- Message -----  From: Donita Brooks D  Sent: 12/21/2021  11:03 AM EDT  To: Nicholas Monday, NP  Subject: BX                                             Patient has not returned any calls to get this [place] scheduled   Nicholas Avila

## 2021-12-21 NOTE — Telephone Encounter (Signed)
Noted  

## 2021-12-21 NOTE — Telephone Encounter (Signed)
Routing to [employer] as [employer]

## 2021-12-21 NOTE — Telephone Encounter (Signed)
Pt works third shift and sleep most of the day. I have tried to call him. I will mail a letter.

## 2021-12-22 DIAGNOSIS — F411 Generalized anxiety disorder: Secondary | ICD-10-CM | POA: Diagnosis not present

## 2021-12-22 DIAGNOSIS — I700 Atherosclerosis of aorta: Secondary | ICD-10-CM | POA: Diagnosis not present

## 2021-12-22 DIAGNOSIS — R600 Localized edema: Secondary | ICD-10-CM | POA: Diagnosis not present

## 2021-12-22 DIAGNOSIS — Z0001 Encounter for general adult medical examination with abnormal findings: Secondary | ICD-10-CM | POA: Diagnosis not present

## 2021-12-28 ENCOUNTER — Encounter: Payer: Self-pay | Admitting: Orthopaedic Surgery

## 2021-12-28 ENCOUNTER — Ambulatory Visit (INDEPENDENT_AMBULATORY_CARE_PROVIDER_SITE_OTHER): Payer: BC Managed Care – PPO | Admitting: Orthopaedic Surgery

## 2021-12-28 VITALS — BP 135/81 | HR 89 | Ht 71.0 in | Wt 129.6 lb

## 2021-12-28 DIAGNOSIS — M545 Low back pain, unspecified: Secondary | ICD-10-CM

## 2021-12-28 DIAGNOSIS — Z8701 Personal history of pneumonia (recurrent): Secondary | ICD-10-CM

## 2021-12-28 DIAGNOSIS — M79605 Pain in left leg: Secondary | ICD-10-CM

## 2021-12-28 MED ORDER — HYDROCODONE-ACETAMINOPHEN 5-325 MG PO TABS
ORAL_TABLET | ORAL | 0 refills | Status: DC
Start: 2021-12-28 — End: 2022-01-18

## 2021-12-28 NOTE — Progress Notes (Signed)
I feel a little better.  He is feeling better today.  He is having no breathing problems.  He has no new trauma.  He has gained some weight.  He has joined the [employer] and will be going there for exercises and activities.  BP 135/81   Pulse 89   Ht '5\' 11"'$  (1.803 m)   Wt 129 lb 9.6 oz (58.8 kg)   BMI 18.08 kg/m   Spine/Pelvis examination:  Inspection:  Overall, sacoiliac joint benign and hips nontender; without crepitus or defects.   Thoracic spine inspection: Alignment normal without kyphosis present   Lumbar spine inspection:  Alignment  with normal lumbar lordosis, without scoliosis apparent.   Thoracic spine palpation:  without tenderness of spinal processes   Lumbar spine palpation: without tenderness of lumbar area; without tightness of lumbar muscles    Range of Motion:   Lumbar flexion, forward flexion is normal without pain or tenderness    Lumbar extension is full without pain or tenderness   Left lateral bend is normal without pain or tenderness   Right lateral bend is normal without pain or tenderness   Straight leg raising is normal  Strength & tone: normal   Stability overall normal stability  He is still weak and has decreased endurance.  Encounter Diagnoses  Name Primary?   Lumbar pain with radiation down left leg Yes   History of community acquired pneumonia    I will keep him out of work until September 11th. He needs to increase his strength and endurance.  He is gaining weight again and needs to continue the Ensure.  I have reviewed the [place] web site prior to prescribing narcotic medicine for this patient.  Return in three weeks.  Call if any problem.  Precautions discussed.    Electronically Signed Sanjuana Kava, MD 8/16/20238:55 AM

## 2021-12-28 NOTE — Patient Instructions (Signed)
Return to work 01/23/22. Out of work until then.

## 2022-01-03 ENCOUNTER — Encounter: Payer: Self-pay | Admitting: Nurse Practitioner

## 2022-01-03 ENCOUNTER — Ambulatory Visit (INDEPENDENT_AMBULATORY_CARE_PROVIDER_SITE_OTHER): Payer: BC Managed Care – PPO | Admitting: Nurse Practitioner

## 2022-01-03 VITALS — BP 106/60 | HR 87 | Temp 98.5°F | Ht 71.0 in | Wt 132.4 lb

## 2022-01-03 DIAGNOSIS — J13 Pneumonia due to Streptococcus pneumoniae: Secondary | ICD-10-CM | POA: Diagnosis not present

## 2022-01-03 DIAGNOSIS — J432 Centrilobular emphysema: Secondary | ICD-10-CM

## 2022-01-03 DIAGNOSIS — D72829 Elevated white blood cell count, unspecified: Secondary | ICD-10-CM

## 2022-01-03 LAB — CBC WITH DIFFERENTIAL/PLATELET
Basophils Absolute: 0.1 10*3/uL (ref 0.0–0.1)
Basophils Relative: 1.5 % (ref 0.0–3.0)
Eosinophils Absolute: 0.4 10*3/uL (ref 0.0–0.7)
Eosinophils Relative: 4.2 % (ref 0.0–5.0)
HCT: 33.1 % — ABNORMAL LOW (ref 39.0–52.0)
Hemoglobin: 10.5 g/dL — ABNORMAL LOW (ref 13.0–17.0)
Lymphocytes Relative: 29.2 % (ref 12.0–46.0)
Lymphs Abs: 2.9 10*3/uL (ref 0.7–4.0)
MCHC: 31.6 g/dL (ref 30.0–36.0)
MCV: 89.5 fl (ref 78.0–100.0)
Monocytes Absolute: 1 10*3/uL (ref 0.1–1.0)
Monocytes Relative: 10.1 % (ref 3.0–12.0)
Neutro Abs: 5.5 10*3/uL (ref 1.4–7.7)
Neutrophils Relative %: 55 % (ref 43.0–77.0)
Platelets: 386 10*3/uL (ref 150.0–400.0)
RBC: 3.69 Mil/uL — ABNORMAL LOW (ref 4.22–5.81)
RDW: 16.7 % — ABNORMAL HIGH (ref 11.5–15.5)
WBC: 10 10*3/uL (ref 4.0–10.5)

## 2022-01-03 NOTE — Progress Notes (Signed)
Please notify patient that his white blood cell count has normalized, which is good news and a sign his infection is resolving. Thanks!

## 2022-01-03 NOTE — Assessment & Plan Note (Signed)
Long term smoker with emphysematous changes on imaging. He does not have any respiratory complaints at baseline. He is able to do all daily activities without difficulty; gets winded with very strenuous activity. He has combivent available to him but has yet to use it. He has used his neb treatments a few times since being discharged. We will continue to monitor his symptoms. Plan for PFTs once he has fully recovered.

## 2022-01-03 NOTE — Progress Notes (Signed)
$'@Patient'P$  ID: Nicholas Avila, male    DOB: [birth year], 61 y.o.   MRN: [id number]  No chief complaint on file.   Referring provider: [name]*  HPI: 61 year old male, active smoker new to the pulmonary clinic and followed for emphysema as well as recent right lower lobe pneumococcal pneumonia. Past medical history significant for PAF, HTN, hx of ETOH abuse.   He was recently hospitalized from 12/04/2021-12/12/2021 for sepsis secondary to RLL pneumococcal pna and parapneumonic effusion. He was treated with five days of ceftriaxone and azithromycin but had persistent leukocytosis. Recommendation from PCCM was to continue IV abx and repeat imaging. He began to improve over the next few days and was successfully weaned off O2. He was discharged on 3 days of augmentin to complete total 10 day course.   TEST/EVENTS:  12/08/2021 CT chest wo contrast: borderline enlarged mediastinal and hilar LAD, likely reactive. Underlying emphysematous changes. There is extensive RML and RLL airspace consolidation. Small right sided parapneumonic effusion. Few patchy areas of inflammation or infection in both lungs.  12/11/2021 CXR: Worsening infiltrate in the RML and RLL with a small right pleural effusion.  12/15/2021 CXR: there is a persistent infiltrate in the RLL; improved infiltrate in the RML. Improved right pleural effusion.   01/03/2022: Today - follow up Patient presents today with his wife for hospital follow up. He was discharged on 7/31 after being admitted for sepsis secondary to pneumococcal pneumonia. He was seen by his PCP on 8/3 with repeat CXR which showed persistent RLL infiltrate, improving RML infiltrate and improved pleural effusion. He was treated with an additional 7 days of levaquin 750 mg, which he completed around a week ago. Today, he reports that he is feeling much better since being discharged. He feels like his breathing is back to his baseline; hasn't had to use his combivent.  Energy is improving; still feeling a little more fatigued from his baseline but getting better every day. He has been able to return to the gym and is exercising a few times a week. Cough has mostly resolved and he hasn't had any significant chest congestion. Denies any fevers, chills, night sweats, wheezing, hemoptysis, anorexia. He is no longer taking any robitussin. Occasionally uses his neb treatment but not daily. No concerns or complaints today.   No Known Allergies   There is no immunization history on file for this patient.  Past Medical History:  Diagnosis Date   Alcoholism in remission ([place])    quit 12/10/17. longest time alcohol free. reports multiple previous DUIs (04/08/18)   Atrial flutter ([place])    a. diagnosed in 11/2017 during admission for Sepsis b. s/p successful DCCV in 01/2018.   Dysrhythmia    AFib   Pulmonary nodules    Tobacco use     Tobacco History: Social History   Tobacco Use  Smoking Status Former   Packs/day: 0.75   Years: 35.00   Total pack years: 26.25   Types: Cigarettes  Smokeless Tobacco Former   Counseling given: Not Answered   Outpatient Medications Prior to Visit  Medication Sig Dispense Refill   acetaminophen (TYLENOL) 500 MG tablet Take 1,000 mg by mouth every 6 (six) hours as needed for moderate pain. (Patient not taking: Reported on 01/03/2022)     amLODipine (NORVASC) 10 MG tablet Take 10 mg by mouth daily. (Patient not taking: Reported on 01/03/2022)     guaiFENesin (ROBITUSSIN) 100 MG/5ML liquid Take 5 mLs by mouth every 6 (six) hours  as needed for cough or to loosen phlegm. (Patient not taking: Reported on 01/03/2022) 120 mL 0   HYDROcodone-acetaminophen (NORCO/VICODIN) 5-325 MG tablet One tablet every six hours for pain.  Limit 7 days. (Patient not taking: Reported on 01/03/2022) 28 tablet 0   Ipratropium-Albuterol (COMBIVENT RESPIMAT) 20-100 MCG/ACT AERS respimat Inhale 1 puff into the lungs every 6 (six) hours as needed for wheezing or  shortness of breath. (Patient not taking: Reported on 01/03/2022) 4 g 1   polyethylene glycol-electrolytes (TRILYTE) 420 g solution Take 4,000 mLs by mouth as directed. (Patient not taking: Reported on 01/03/2022) 4000 mL 0   sildenafil (VIAGRA) 100 MG tablet Take 100 mg by mouth daily as needed. (Patient not taking: Reported on 01/03/2022)     No facility-administered medications prior to visit.     Review of Systems:   Constitutional: No weight loss or gain, night sweats, fevers, chills, or lassitude. +fatigue HEENT: No headaches, difficulty swallowing, tooth/dental problems, or sore throat. No sneezing, itching, ear ache, nasal congestion, or post nasal drip CV:  No chest pain, orthopnea, PND, swelling in lower extremities, anasarca, dizziness, palpitations, syncope Resp: +shortness of breath with strenuous activity (baseline). No excess mucus or change in color of mucus. No productive or non-productive. No hemoptysis. No wheezing.  No chest wall deformity GI:  No heartburn, indigestion, abdominal pain, nausea, vomiting, diarrhea, change in bowel habits, loss of appetite, bloody stools.  GU: No dysuria, change in color of urine, urgency or frequency.  No flank pain, no hematuria  Neuro: No dizziness or lightheadedness.  Psych: No depression or anxiety. Mood stable.     Physical Exam:  BP 106/60 (BP Location: Right Arm, Patient Position: Sitting, Cuff Size: Normal)   Pulse 87   Temp 98.5 F (36.9 C) (Oral)   Ht '5\' 11"'$  (1.803 m)   Wt 132 lb 6.4 oz (60.1 kg)   SpO2 99%   BMI 18.47 kg/m   GEN: Pleasant, interactive, well-appearing; in no acute distress. HEENT:  Normocephalic and atraumatic. PERRLA. Sclera white. Nasal turbinates pink, moist and patent bilaterally. No rhinorrhea present. Oropharynx pink and moist, without exudate or edema. No lesions, ulcerations, or postnasal drip.  NECK:  Supple w/ fair ROM. No JVD present. Normal carotid impulses w/o bruits. Thyroid symmetrical with  no goiter or nodules palpated. No lymphadenopathy.   CV: RRR, no m/r/g, no peripheral edema. Pulses intact, +2 bilaterally. No cyanosis, pallor or clubbing. PULMONARY:  Unlabored, regular breathing. Clear bilaterally A&P w/o wheezes/rales/rhonchi. No accessory muscle use. No dullness to percussion. GI: BS present and normoactive. Soft, non-tender to palpation. No organomegaly or masses detected. No CVA tenderness. MSK: No erythema, warmth or tenderness. Cap refil <2 sec all extrem. No deformities or joint swelling noted. Muscle wasting Neuro: A/Ox3. No focal deficits noted.   Skin: Warm, no lesions or rashe Psych: Normal affect and behavior. Judgement and thought content appropriate.     Lab Results:  CBC    Component Value Date/Time   WBC 15.0 (H) 12/12/2021 0559   RBC 2.81 (L) 12/12/2021 0559   HGB 8.2 (L) 12/12/2021 0559   HCT 25.1 (L) 12/12/2021 0559   PLT 401 (H) 12/12/2021 0559   MCV 89.3 12/12/2021 0559   MCH 29.2 12/12/2021 0559   MCHC 32.7 12/12/2021 0559   RDW 18.0 (H) 12/12/2021 0559   LYMPHSABS 0.5 (L) 12/04/2021 0944   MONOABS 0.0 (L) 12/04/2021 0944   EOSABS 0.0 12/04/2021 0944   BASOSABS 0.0 12/04/2021 0944    BMET  Component Value Date/Time   NA 135 12/12/2021 0559   K 3.9 12/12/2021 0559   CL 103 12/12/2021 0559   CO2 26 12/12/2021 0559   GLUCOSE 99 12/12/2021 0559   BUN 5 (L) 12/12/2021 0559   CREATININE 0.47 (L) 12/12/2021 0559   CREATININE 0.52 (L) 10/06/2021 1130   CALCIUM 8.2 (L) 12/12/2021 0559   GFRNONAA >60 12/12/2021 0559   GFRAA >60 01/22/2018 1526    BNP No results found for: "BNP"   Imaging:  DG Chest 2 View  Result Date: 12/15/2021 CLINICAL DATA:  Pneumonia due to infectious organism.  Cough. EXAM: CHEST - 2 VIEW COMPARISON:  12/11/2021 FINDINGS: Heart size is normal. There is persistent patchy opacity in the RIGHT LOWER lobe, similar to the prior study. Infiltrate in the RIGHT middle lobe persists but has improved. There has been  improvement in RIGHT pleural effusion. LEFT lung remains clear. IMPRESSION: Improved RIGHT pleural effusion. Slightly improved infiltrate in the RIGHT middle lobe. Persistent RIGHT LOWER lobe infiltrate. Electronically Signed   By: Nolon Nations M.D.   On: 12/15/2021 15:54   DG Chest 2 View  Result Date: 12/11/2021 CLINICAL DATA:  Pneumonia. EXAM: CHEST - 2 VIEW COMPARISON:  December 04, 2021 chest x-ray FINDINGS: Worsening infiltrate in the right middle lobe and right base. Small right pleural effusion. The heart, hila, mediastinum, lungs, and pleura are otherwise unremarkable. IMPRESSION: Worsening right-sided pneumonia. Recommend follow-up to complete resolution. Electronically Signed   By: Dorise Bullion III M.D.   On: 12/11/2021 09:40   CT CHEST WO CONTRAST  Result Date: 12/08/2021 CLINICAL DATA:  Follow-up pneumonia. EXAM: CT CHEST WITHOUT CONTRAST TECHNIQUE: Multidetector CT imaging of the chest was performed following the standard protocol without IV contrast. RADIATION DOSE REDUCTION: This exam was performed according to the departmental dose-optimization program which includes automated exposure control, adjustment of the mA and/or kV according to patient size and/or use of iterative reconstruction technique. COMPARISON:  Prior chest CT 08/06/2020 and recent chest x-ray 12/04/2021 FINDINGS: Cardiovascular: The heart is normal in size. No pericardial effusion. The aorta is normal in caliber. Stable age advanced atherosclerotic calcifications. Stable significant age advanced three-vessel coronary artery calcifications. Mediastinum/Nodes: Borderline enlarged mediastinal and hilar lymph nodes, likely reactive given the lung findings. The esophagus is grossly normal. Lungs/Pleura: Underlying emphysematous changes again noted. Fairly extensive right lower lobe airspace process with consolidation, surrounding ground-glass opacity and air bronchograms. Similar findings also noted in the right middle lobe.  A few other patchy areas of inflammation or infection are noted in both lungs. No worrisome pulmonary lesions. There is a small right-sided parapneumonic effusion. The tracheobronchial tree is unremarkable. No findings for endobronchial lesion or obstruction. Prominent right hilar nodes likely reactive. Upper Abdomen: No significant upper abdominal findings. There is a stable 15 mm left adrenal gland nodule. This measures 14 Hounsfield units and is unchanged since a prior CT scan from 2020. This is consistent with a benign adenoma. Musculoskeletal: No chest wall mass, supraclavicular or axillary adenopathy. The bony thorax is intact. The bony thorax is intact. IMPRESSION: 1. Fairly extensive right lower lobe and right middle lobe airspace consolidation and air bronchograms consistent with pneumonia. 2. A few other patchy areas of inflammation or infection are noted in both lungs. 3. Small right-sided parapneumonic effusion. 4. Borderline enlarged mediastinal and right hilar lymph nodes, likely reactive given the lung findings. 5. Stable age advanced atherosclerotic calcifications involving the aorta and coronary arteries. 6. Stable left adrenal gland adenoma. Aortic Atherosclerosis (ICD10-I70.0) and  Emphysema (ICD10-J43.9). Electronically Signed   By: Marijo Sanes M.D.   On: 12/08/2021 10:55          No data to display          No results found for: "NITRICOXIDE"      Assessment & Plan:   Pneumococcal pneumonia ([place]) He was hospitalized from 7/23-7/31 for sepsis secondary to RML/RLL pneumococcal pneumonia. He was treated with ceftriaxone/azithromycin for 5 days; however, had persistent leukocytosis so continue abx therapy for a total of 10 days. He then saw his PCP, Dr. Legrand Rams, on 8/3; still with some cough and CXR showed persistent RLL infiltrate. Treated with an additional 7 day course of levaquin 750 mg. He is clinically improved today. We will plan for repeat imaging in 6 weeks to assess for  resolution. Recheck CBC today to assess for resolving leukocytosis. Provided with strict return precautions.   Patient Instructions  Continue Combivent 1 puff every 6 hours as needed for shortness of breath or wheezing   Labs today - CBC  Repeat chest x ray in 6 weeks   Follow up in 6 weeks with Dr. Elsworth Soho with chest x ray before. If symptoms do not improve or worsen, please contact office for sooner follow up or seek emergency care.     Centrilobular emphysema ([place]) Long term smoker with emphysematous changes on imaging. He does not have any respiratory complaints at baseline. He is able to do all daily activities without difficulty; gets winded with very strenuous activity. He has combivent available to him but has yet to use it. He has used his neb treatments a few times since being discharged. We will continue to monitor his symptoms. Plan for PFTs once he has fully recovered.    I spent 35 minutes of dedicated to the care of this patient on the date of this encounter to include pre-visit review of records, face-to-face time with the patient discussing conditions above, post visit ordering of testing, clinical documentation with the electronic health record, making appropriate referrals as documented, and communicating necessary findings to members of the patients care team.  Clayton Bibles, NP 01/03/2022  Pt aware and understands NP's role.

## 2022-01-03 NOTE — Patient Instructions (Addendum)
Continue Combivent 1 puff every 6 hours as needed for shortness of breath or wheezing   Labs today - CBC  Repeat chest x ray in 6 weeks   Follow up in 6 weeks with Dr. Elsworth Soho with chest x ray before. If symptoms do not improve or worsen, please contact office for sooner follow up or seek emergency care.

## 2022-01-03 NOTE — Assessment & Plan Note (Addendum)
He was hospitalized from 7/23-7/31 for sepsis secondary to RML/RLL pneumococcal pneumonia. He was treated with ceftriaxone/azithromycin for 5 days; however, had persistent leukocytosis so continue abx therapy for a total of 10 days. He then saw his PCP, Dr. Legrand Rams, on 8/3; still with some cough and CXR showed persistent RLL infiltrate. Treated with an additional 7 day course of levaquin 750 mg. He is clinically improved today. We will plan for repeat imaging in 6 weeks to assess for resolution. Recheck CBC today to assess for resolving leukocytosis. Provided with strict return precautions.   Patient Instructions  Continue Combivent 1 puff every 6 hours as needed for shortness of breath or wheezing   Labs today - CBC  Repeat chest x ray in 6 weeks   Follow up in 6 weeks with Dr. Elsworth Soho with chest x ray before. If symptoms do not improve or worsen, please contact office for sooner follow up or seek emergency care.

## 2022-01-11 ENCOUNTER — Ambulatory Visit: Payer: BC Managed Care – PPO | Admitting: Orthopaedic Surgery

## 2022-01-12 ENCOUNTER — Telehealth: Payer: Self-pay | Admitting: Nurse Practitioner

## 2022-01-12 NOTE — Telephone Encounter (Signed)
Received disability form from [facility] via fax on 8/23.  I completed info about office visit, hospitalization dates.  Left voice message for patient asking if he has returned to work and if there are any work restrictions that need to be documented.  Also need to tell him about the $29 processing fee.  Asked that he call me back - cannot complete form without talking to him first.

## 2022-01-13 NOTE — Telephone Encounter (Signed)
LMOM for pt to call office  

## 2022-01-17 NOTE — Telephone Encounter (Addendum)
I returned patient's voice message from Friday, 9/1.   He stated he does not believe we need to complete the disability form from [employer] because his bone doctor, Dr. Luna Glasgow, has completed it and sent it in.  I have emailed Nicholas Avila a copy of the blank form we received and he will verify with Dr. Luna Glasgow when he sees him on 9/6.  If we do need to fill the form out, Nicholas Avila will call me back and let me know.

## 2022-01-18 ENCOUNTER — Encounter: Payer: Self-pay | Admitting: *Deleted

## 2022-01-18 ENCOUNTER — Ambulatory Visit (INDEPENDENT_AMBULATORY_CARE_PROVIDER_SITE_OTHER): Payer: Self-pay | Admitting: Orthopaedic Surgery

## 2022-01-18 ENCOUNTER — Encounter: Payer: Self-pay | Admitting: Orthopaedic Surgery

## 2022-01-18 ENCOUNTER — Ambulatory Visit (INDEPENDENT_AMBULATORY_CARE_PROVIDER_SITE_OTHER): Payer: BC Managed Care – PPO

## 2022-01-18 DIAGNOSIS — M79605 Pain in left leg: Secondary | ICD-10-CM

## 2022-01-18 DIAGNOSIS — Z8701 Personal history of pneumonia (recurrent): Secondary | ICD-10-CM

## 2022-01-18 DIAGNOSIS — M25561 Pain in right knee: Secondary | ICD-10-CM

## 2022-01-18 DIAGNOSIS — F17210 Nicotine dependence, cigarettes, uncomplicated: Secondary | ICD-10-CM

## 2022-01-18 DIAGNOSIS — Z0289 Encounter for other administrative examinations: Secondary | ICD-10-CM

## 2022-01-18 DIAGNOSIS — M545 Low back pain, unspecified: Secondary | ICD-10-CM

## 2022-01-18 MED ORDER — HYDROCODONE-ACETAMINOPHEN 5-325 MG PO TABS: ORAL_TABLET | ORAL | 0 refills | Status: DC

## 2022-01-18 MED ORDER — NAPROXEN 500 MG PO TABS: 500.0000 mg | ORAL_TABLET | Freq: Two times a day (BID) | ORAL | 5 refills | Status: DC

## 2022-01-18 NOTE — Patient Instructions (Addendum)
OOW note until next visit F/U 02/08/22  Increase your activity slowly

## 2022-01-18 NOTE — Progress Notes (Signed)
I am feeling a little better.  My right knee hurts.  He has developed pain in the right knee with swelling and popping but no giving way.  He denies any trauma.  He has used ice and heat.  He has history of liver problems and was told to limit tylenol use.  His back pain is stable now.  He has less pain.   He is going to the senior center and working out.  His weight today is 147.  He is getting stronger and breathing better.  Spine/Pelvis examination:  Inspection:  Overall, sacoiliac joint benign and hips nontender; without crepitus or defects.   Thoracic spine inspection: Alignment normal without kyphosis present   Lumbar spine inspection:  Alignment  with normal lumbar lordosis, without scoliosis apparent.   Thoracic spine palpation:  without tenderness of spinal processes   Lumbar spine palpation: without tenderness of lumbar area; without tightness of lumbar muscles    Range of Motion:   Lumbar flexion, forward flexion is normal without pain or tenderness    Lumbar extension is full without pain or tenderness   Left lateral bend is normal without pain or tenderness   Right lateral bend is normal without pain or tenderness   Straight leg raising is normal  Strength & tone: normal   Stability overall normal stability  Right knee has effusion, pain, crepitus, limp to the right, stable.  NV intact.  No distal edema.  X-rays were done of the right knee, reported separately.  Encounter Diagnoses  Name Primary?   Acute pain of right knee Yes   Lumbar pain with radiation down left leg    Nicotine dependence, cigarettes, uncomplicated    History of community acquired pneumonia    I will give Naprosyn for the knee.  I have refilled his pain medicine.  I have reviewed the [place] web site prior to prescribing narcotic medicine for this patient.  Return in three weeks.  Continue working out, eating better.  He has appointment to  see his liver doctor on the 19th.  Call if any problem.  Precautions discussed.  Stay out of work. Forms for disability completed.  Electronically Signed Sanjuana Kava, MD 9/6/20239:38 AM

## 2022-01-18 NOTE — Telephone Encounter (Signed)
Sent pt a MyChart message

## 2022-01-19 ENCOUNTER — Encounter: Payer: Self-pay | Admitting: *Deleted

## 2022-01-19 NOTE — Telephone Encounter (Signed)
Tried several times to reach pt. Mailed a letter.  

## 2022-01-27 ENCOUNTER — Telehealth: Payer: Self-pay | Admitting: *Deleted

## 2022-01-27 NOTE — Telephone Encounter (Signed)
Spoke to pt, he states he has not had liver biopsy done yet. Wants to discuss more with provider. I reminded him of OV on 9/19 at 1:30. Pt voiced understanding.

## 2022-01-30 NOTE — Progress Notes (Unsigned)
GI Office Note    Referring Provider: [name]* Primary Care Physician:  [name], MD Primary Gastroenterologist: Dr. Gala Romney  Date:  01/31/2022  ID:  Nicholas Avila, DOB 1960/06/05, MRN [id number]   Chief Complaint   Chief Complaint  Patient presents with   Follow-up    Talk about the procedure.     History of Present Illness  Nicholas Avila is a 61 y.o. male with a history of alcohol abuse, a flutter, tobacco use, pulmonary nodules, HTN, anemia, history of adenomatous colon polyps presenting today for hospital follow-up.  Last colonoscopy 05/23/2018: 6 mm tubular adenoma in the descending colon removed with hot snare and skip, sessile hyperplastic polyp in cecum.  Nonbleeding internal hemorrhoids.  Due to location and size and type of removal required, he was recommended to have repeat colonoscopy in 6 months  Last seen in the office 10/03/2021.  He denied abdominal pain.  Did report intermittent gas/bloating usually relieved after having a bowel movement.  Did report a change in bowel habits over the last couple months prior with formed stools during the day and sometimes multiple small bowel movements throughout the day and waking up at night to have bowel movements.  Stools Nicholas be watery at times.  He denies any BRBPR or melena.  Did report a 10 pound weight loss over the prior few months.  Few months prior noted he did not have a good appetite and not sure what happened but denies any nausea/vomiting and his appetite have returned.  Denies GERD or dysphagia.  Was drinking 3-4, 12 ounce beers daily, has stopped drinking bootleggers and vodka.  Full work-up of elevated LFTs ordered including stool studies and abdominal ultrasound.  He was scheduled for colonoscopy with Dr. Gala Romney in the near future.  Also counseled on alcohol cessation and 2 g sodium diet.  Colonoscopy was scheduled and then canceled and not rescheduled.  Labs in May with stable hemoglobin at  10.2, improvement of AST and ALT, alk phos 237, T. bili 0.9.  He was found to be immune to hepatitis A and B.  No active hepatitis A, B, or C.  His iron saturation and ferritin were elevated.  B12 folate normal.  ANA and AMA negative, ASMA slightly positive at 20.  IgA elevated at 748, IgG elevated at 2508.  He was recommended to have repeat HFP, immunoglobulins, and hemochromatosis testing.  Abdominal ultrasound 10/17/21 with hepatic steatosis, trace perihepatic ascites, mildly dilated CBD. MRCP obtained 11/01/2021 revealing no biliary ductal dilation and evidence of hepatomegaly with mild hepatic steatosis and trace perihepatic ascites. Liver biopsy was discussed with the patient previously which he declined and would rather work on alcohol cessation and repeating lab work.  He was seen for consultation during recent hospitalization on 12/05/2021 where he presented with diarrhea, chills, malaise, and fever and he has signs concerning for sepsis related to pulmonary infiltrates.  His LFTs were elevated AST 65 (108), ALT 38 (58), Alk Phos 139 (185), T.Bili 3.2 (4.5). IgG 2338 (2508).  Hemochromatosis DNA testing was negative.  In hospital discussion with Dr. Jenetta Downer and decision was made that patient needed to have outpatient liver biopsy.  IR attempted multiple times to reach the patient to schedule procedure but was unsuccessful.  CBC 01/03/2022 with hemoglobin improved to 10.5.  Today: Has not been back to work since his hospitalization. Has not had any alcohol since prior to recent hospitalization. Energy is okay.  Denied any withdrawal symptoms initially.  Goes to do exercise 3 days a week to lift weights. No more diarrhea. Has been having firm stools and at times has been difficult to go. Denies any melena or BRBPR. Has not tried any stools softeners. Stools are bristol 3/4 and going daily. No abdominal pain. Has had some itching since starting diuretic (prescribed by Dr. Legrand Rams). Itching is more times  than he thinks he should be - primarily at night and mostly noted on his forehead.  Not sure if its coming from detergents either. Thought he had a rash on his face as well last week and used hydrocortisone cream on his face and has had improvement. Denies any reflux, dysphagia.   Per chart review, patient's weight in August was 132 pounds, currently weighs 153 pounds by current scale.  Has seen pulmonology since discharge (seen them 2-3 weeks ago).  Seen PCP today.   Has fear of needles therefore has reservations about liver biopsy being completed.  Thinks he may need sedation to have it performed.   Current Outpatient Medications  Medication Sig Dispense Refill   HYDROcodone-acetaminophen (NORCO/VICODIN) 5-325 MG tablet One tablet every six hours for pain.  Limit 7 days. 28 tablet 0   Ipratropium-Albuterol (COMBIVENT RESPIMAT) 20-100 MCG/ACT AERS respimat Inhale 1 puff into the lungs every 6 (six) hours as needed for wheezing or shortness of breath. 4 g 1   naproxen (NAPROSYN) 500 MG tablet Take 1 tablet (500 mg total) by mouth 2 (two) times daily with a meal. 60 tablet 5   spironolactone (ALDACTONE) 25 MG tablet Take 25 mg by mouth 2 (two) times daily.     No current facility-administered medications for this visit.    Past Medical History:  Diagnosis Date   Alcoholism in remission ([place])    quit 12/10/17. longest time alcohol free. reports multiple previous DUIs (04/08/18)   Atrial flutter ([place])    a. diagnosed in 11/2017 during admission for Sepsis b. s/p successful DCCV in 01/2018.   Dysrhythmia    AFib   Pulmonary nodules    Tobacco use     Past Surgical History:  Procedure Laterality Date   CARDIOVERSION N/A 01/24/2018   Procedure: CARDIOVERSION;  Surgeon: Arnoldo Lenis, MD;  [facility];  Service: Endoscopy;  Laterality: N/A;   COLONOSCOPY WITH PROPOFOL N/A 05/23/2018   Procedure: COLONOSCOPY WITH PROPOFOL;  Surgeon: Daneil Dolin, MD;  Location: [facility];  Service: Endoscopy;  Laterality: N/A;  8:30am   POLYPECTOMY  05/23/2018   Procedure: POLYPECTOMY;  Surgeon: Daneil Dolin, MD;  Location: [facility];  Service: Endoscopy;;  cecal polyp, descending polyp    Family History  Problem Relation Age of Onset   Prostate cancer Father        late 79s   Heart disease Maternal Aunt    Colon cancer Neg Hx    Liver disease Neg Hx     Allergies as of 01/31/2022   (No Known Allergies)    Social History   Socioeconomic History   Marital status: Married    Spouse name: Not on file   Number of children: Not on file   Years of education: Not on file   Highest education level: Not on file  Occupational History   Not on file  Tobacco Use   Smoking status: Former    Packs/day: 0.75    Years: 35.00    Total pack years: 26.25    Types: Cigarettes   Smokeless tobacco: Former  Vaping Use   Vaping Use: Never used  Substance and Sexual Activity   Alcohol use: Yes    Comment: 3-4, 12 oz beer per day.   Drug use: Yes    Types: Marijuana    Comment: last used 01/22/2018   Sexual activity: Not Currently  Other Topics Concern   Not on file  Social History Narrative   Not on file   Social Determinants of Health   Financial Resource Strain: Not on file  Food Insecurity: Unknown (12/11/2017)   Hunger Vital Sign    Worried About Running Out of Food in the Last Year: Patient refused    [place] in the Last Year: Patient refused  Transportation Needs: Unknown (12/11/2017)   [place] - Hydrologist (Medical): Patient refused    Lack of Transportation (Non-Medical): Patient refused  Physical Activity: Unknown (12/11/2017)   Exercise Vital Sign    Days of Exercise per Week: Patient refused    Minutes of Exercise per Session: Patient refused  Stress: No Stress Concern Present (12/11/2017)   [place] of Stress : Not at all   Social Connections: Unknown (12/11/2017)   Social Connection and Isolation Panel [NHANES]    Frequency of Communication with Friends and Family: Patient refused    Frequency of Social Gatherings with Friends and Family: Patient refused    Attends Religious Services: Patient refused    Marine scientist or Organizations: Patient refused    Attends Archivist Meetings: Patient refused    Marital Status: Patient refused     Review of Systems   Gen: Denies fever, chills, anorexia. Denies fatigue, weakness, weight loss.  CV: Denies chest pain, palpitations, syncope, peripheral edema, and claudication. Resp: Denies dyspnea at rest, cough, wheezing, coughing up blood, and pleurisy. GI: See HPI Derm: Denies rash, itching, dry skin Psych: Denies depression, anxiety, memory loss, confusion. No homicidal or suicidal ideation.  Heme: Denies bruising, bleeding, and enlarged lymph nodes.   Physical Exam   BP 129/69 (BP Location: Right Arm, Patient Position: Sitting, Cuff Size: Normal)   Pulse 76   Temp 98.1 F (36.7 C) (Temporal)   Ht '5\' 10"'  (1.778 m)   Wt 153 lb (69.4 kg)   SpO2 99%   BMI 21.95 kg/m   General:   Alert and oriented. No distress noted. Pleasant and cooperative.  Head:  Normocephalic and atraumatic. Eyes:  Conjuctiva clear without scleral icterus. Lungs:  Clear to auscultation bilaterally. No wheezes, rales, or rhonchi. No distress.  Heart:  S1, S2 present without murmurs appreciated.  Abdomen:  +BS, soft, non-tender and non-distended. No rebound or guarding. No HSM or masses noted. Rectal: Furred Msk:  Symmetrical without gross deformities. Normal posture. Extremities:  Without edema. Neurologic:  Alert and  oriented x4 Psych:  Alert and cooperative. Normal mood and affect.   Assessment  Nicholas Avila is a 61 y.o. male with a history of alcohol abuse, a flutter, tobacco use, pulmonary nodules, HTN, anemia, history of adenomatous colon polyps  presenting today for hospital follow-up.  Elevated LFTs: Original outpatient evaluation started on 10/03/2021.  Previous work-up with autoimmune serologies, viral hepatitis, immunoglobulins, anemia panel.  His labs revealed elevated iron saturation, ferritin, IgA, and IgG.  ASMA was also slightly positive.  Negative for acute hepatitis.  Found to be immune to hepatitis A and hepatitis B.  Originally felt as his elevated IgG  and IgA could have been related to alcohol use.  MRI/MRCP noted hepatomegaly and mild hepatic steatosis without hepatic lesions.  He did have biliary ductal dilation on abdominal ultrasound but was not present on MRCP.  He was then recommended to have hemochromatosis testing and repeat IgG.  Hemochromatosis testing was negative.  Repeat IgG 12/02/2021 was 2338 (2508).  During hospital admission he had elevated LFTs, initial bump suspected to be likely due to sepsis, his LFTs improved with IV hydration.  At the time of his admission, he had reported he had continue to drink 3-4 (12 ounce) beers a day.  Given his elevated IgG and positive ASMA, there is concern for possible autoimmune hepatitis and was advised to have outpatient liver biopsy.  Referral was placed however IR had difficulty getting in contact with the patient to get scheduled.  We discussed importance of this today and provided him with likely phone numbers that he may receive a phone call from and advised him to answer these phone calls or at least call the number back to get his biopsy scheduled.  He also requested that he have repeat CMP performed to see if LFTs have improved since his hospitalization and to complete this within 1 to 2 weeks.  Anemia/history of colon polyps: He denied any overt GI bleeding on initial outpatient evaluation as well as inpatient.  Work-up thus far is revealed normal B12 and folate.  Iron panel in May with iron 89, saturation 53%, and elevated ferritin at 990.  His hemoglobin inpatient was 8.1,  previously 9.8.  Suspect that given the amount of IV fluids he received this is likely a somewhat dilutional effect.  His hemoglobin prior to discharge was 8.2.  Recent hemoglobin 01/03/2022 was 10.5.  He was initially advised to complete colonoscopy at his appointment in May due to anemia, change in bowel habits, and his history of adenomatous colon polyps.  Last colonoscopy in January 2020 with 6 mm tubular adenoma removed from the descending colon with hot snare and skip as well as a hyperplastic polyp in the cecum.  He was originally recommended to have follow-up colonoscopy in 6 months and is well overdue.  We discussed rescheduling this procedure.  He would prefer to address the liver biopsy first and readdress colonoscopy at a later date.  I strongly advised that we complete this in the near future.  Change in bowel habits: Initial outpatient evaluation due to change in bowel habits from soft formed bowel movements daily to small volume loose stools as well as nocturnal stools.  During his most recent ED visit he was complaining of ongoing diarrhea.  Work-up has been negative for TSH, stool studies have been negative and abdominal imaging negative for any gallbladder or pancreatic process.  As he was in the hospital, he reported stool consistency had improved and currently now he denies any loose stools.  He actually reports stools have been more firm, sometimes hard and sometimes difficult to go.  He denies any melena or BRBPR.  Has had some weight gain and not any weight loss as of recently.  Advised him that he may try over-the-counter Colace or MiraLAX as needed for constipation.  As discussed above, he is in need of surveillance colonoscopy in the very near future.  PLAN   CMP in 1-2 weeks Liver biopsy referral  Over-the-counter Colace or MiraLAX 1 capful daily as needed. Needs to update colonoscopy in near future - requests to wait until after liver biopsy.  Heart healthy -  2g sodium  diet Continue with alcohol cessation Avoid NSAIDs Continue spironolactone Follow up 6 weeks post liver biopsy    [name], MSN, FNP-BC, AGACNP-BC [facility]

## 2022-01-31 ENCOUNTER — Encounter: Payer: Self-pay | Admitting: Gastroenterology

## 2022-01-31 ENCOUNTER — Ambulatory Visit (INDEPENDENT_AMBULATORY_CARE_PROVIDER_SITE_OTHER): Payer: BC Managed Care – PPO | Admitting: Gastroenterology

## 2022-01-31 VITALS — BP 129/69 | HR 76 | Temp 98.1°F | Ht 70.0 in | Wt 153.0 lb

## 2022-01-31 DIAGNOSIS — R7989 Other specified abnormal findings of blood chemistry: Secondary | ICD-10-CM

## 2022-01-31 DIAGNOSIS — K7030 Alcoholic cirrhosis of liver without ascites: Secondary | ICD-10-CM | POA: Diagnosis not present

## 2022-01-31 DIAGNOSIS — D649 Anemia, unspecified: Secondary | ICD-10-CM

## 2022-01-31 DIAGNOSIS — Z86010 Personal history of colonic polyps: Secondary | ICD-10-CM

## 2022-01-31 DIAGNOSIS — R194 Change in bowel habit: Secondary | ICD-10-CM

## 2022-01-31 DIAGNOSIS — A5903 Trichomonal cystitis and urethritis: Secondary | ICD-10-CM | POA: Diagnosis not present

## 2022-01-31 DIAGNOSIS — Z860101 Personal history of adenomatous and serrated colon polyps: Secondary | ICD-10-CM

## 2022-01-31 DIAGNOSIS — R600 Localized edema: Secondary | ICD-10-CM | POA: Diagnosis not present

## 2022-01-31 NOTE — Patient Instructions (Addendum)
For constipation: Want you to increase fiber in your diet You may take an over-the-counter Colace or 1 capful of MiraLAX daily as needed to help soften up your stools.  I want you to follow heart healthy, low-sodium diet. Continue alcohol cessation Avoid all NSAIDs -Aleve, Advil, naproxen, ibuprofen, BC or Goody powders  I am resending the referral for your liver biopsy.  You will be contacted by interventional radiology in [facility] to get this scheduled. The phone number may be similar to: [phone]; [phone]; [phone]; [phone].  Will likely be coming from [facility] radiology at any of these numbers.  Want you to follow-up about 6 weeks after your liver biopsy for [place] to discuss performing your repeat colonoscopy.  There will be further recommendations after we have received the results of your liver biopsy.  It was a pleasure to see you today. I want to create trusting relationships with patients. If you receive a survey regarding your visit,  I greatly appreciate you taking time to fill this out on paper or through your MyChart. I value your feedback.  [name], MSN, FNP-BC, AGACNP-BC [facility]

## 2022-02-08 ENCOUNTER — Encounter: Payer: Self-pay | Admitting: Orthopaedic Surgery

## 2022-02-08 ENCOUNTER — Ambulatory Visit (INDEPENDENT_AMBULATORY_CARE_PROVIDER_SITE_OTHER): Payer: BC Managed Care – PPO | Admitting: Orthopaedic Surgery

## 2022-02-08 VITALS — Wt 154.0 lb

## 2022-02-08 DIAGNOSIS — M545 Low back pain, unspecified: Secondary | ICD-10-CM | POA: Diagnosis not present

## 2022-02-08 DIAGNOSIS — F17210 Nicotine dependence, cigarettes, uncomplicated: Secondary | ICD-10-CM

## 2022-02-08 DIAGNOSIS — M79605 Pain in left leg: Secondary | ICD-10-CM | POA: Diagnosis not present

## 2022-02-08 MED ORDER — HYDROCODONE-ACETAMINOPHEN 5-325 MG PO TABS: ORAL_TABLET | ORAL | 0 refills | Status: DC

## 2022-02-08 NOTE — Progress Notes (Signed)
My back is a little better.  He has less lower back pain.  He is moving about better.  He has no numbness, no new trauma.  He weights 154 today, up from 147 last time.  He has no breathing problems.  He is active. He is for a liver biopsy on October 2.  Spine/Pelvis examination:  Inspection:  Overall, sacoiliac joint benign and hips nontender; without crepitus or defects.   Thoracic spine inspection: Alignment normal without kyphosis present   Lumbar spine inspection:  Alignment  with normal lumbar lordosis, without scoliosis apparent.   Thoracic spine palpation:  without tenderness of spinal processes   Lumbar spine palpation: without tenderness of lumbar area; without tightness of lumbar muscles    Range of Motion:   Lumbar flexion, forward flexion is normal without pain or tenderness    Lumbar extension is full without pain or tenderness   Left lateral bend is normal without pain or tenderness   Right lateral bend is normal without pain or tenderness   Straight leg raising is normal  Strength & tone: normal   Stability overall normal stability  His right knee is not tender today.  He has full ROM  Encounter Diagnoses  Name Primary?   Lumbar pain with radiation down left leg Yes   Nicotine dependence, cigarettes, uncomplicated    Stay out of work until October 9 then return full duty.  I have reviewed the [place] web site prior to prescribing narcotic medicine for this patient.  Call if any problem.  Precautions discussed.  I will see him in six weeks.  Electronically Signed Sanjuana Kava, MD 9/27/20239:47 AM

## 2022-02-08 NOTE — Patient Instructions (Signed)
OOW for 2 weeks  RTW 02/20/22

## 2022-02-09 ENCOUNTER — Other Ambulatory Visit: Payer: Self-pay | Admitting: Radiology

## 2022-02-09 DIAGNOSIS — R7989 Other specified abnormal findings of blood chemistry: Secondary | ICD-10-CM

## 2022-02-10 ENCOUNTER — Other Ambulatory Visit: Payer: Self-pay | Admitting: Student

## 2022-02-13 ENCOUNTER — Other Ambulatory Visit: Payer: Self-pay

## 2022-02-13 ENCOUNTER — Ambulatory Visit (HOSPITAL_COMMUNITY)
Admission: RE | Admit: 2022-02-13 | Discharge: 2022-02-13 | Disposition: A | Discharge status: Home / Self Care | Payer: BC Managed Care – PPO | Source: Ambulatory Visit | Attending: Gastroenterology | Admitting: Gastroenterology

## 2022-02-13 DIAGNOSIS — Z87891 Personal history of nicotine dependence: Secondary | ICD-10-CM | POA: Insufficient documentation

## 2022-02-13 DIAGNOSIS — K7581 Nonalcoholic steatohepatitis (NASH): Secondary | ICD-10-CM | POA: Diagnosis not present

## 2022-02-13 DIAGNOSIS — I4892 Unspecified atrial flutter: Secondary | ICD-10-CM | POA: Diagnosis not present

## 2022-02-13 DIAGNOSIS — K7460 Unspecified cirrhosis of liver: Secondary | ICD-10-CM | POA: Diagnosis not present

## 2022-02-13 DIAGNOSIS — R188 Other ascites: Secondary | ICD-10-CM | POA: Diagnosis not present

## 2022-02-13 DIAGNOSIS — D649 Anemia, unspecified: Secondary | ICD-10-CM | POA: Diagnosis not present

## 2022-02-13 DIAGNOSIS — M1711 Unilateral primary osteoarthritis, right knee: Secondary | ICD-10-CM | POA: Insufficient documentation

## 2022-02-13 DIAGNOSIS — R768 Other specified abnormal immunological findings in serum: Secondary | ICD-10-CM | POA: Insufficient documentation

## 2022-02-13 DIAGNOSIS — R7989 Other specified abnormal findings of blood chemistry: Secondary | ICD-10-CM | POA: Diagnosis not present

## 2022-02-13 DIAGNOSIS — K760 Fatty (change of) liver, not elsewhere classified: Secondary | ICD-10-CM | POA: Insufficient documentation

## 2022-02-13 DIAGNOSIS — R918 Other nonspecific abnormal finding of lung field: Secondary | ICD-10-CM | POA: Insufficient documentation

## 2022-02-13 DIAGNOSIS — K7400 Hepatic fibrosis, unspecified: Secondary | ICD-10-CM | POA: Diagnosis not present

## 2022-02-13 DIAGNOSIS — I10 Essential (primary) hypertension: Secondary | ICD-10-CM | POA: Insufficient documentation

## 2022-02-13 DIAGNOSIS — F1021 Alcohol dependence, in remission: Secondary | ICD-10-CM | POA: Diagnosis not present

## 2022-02-13 DIAGNOSIS — K759 Inflammatory liver disease, unspecified: Secondary | ICD-10-CM | POA: Insufficient documentation

## 2022-02-13 DIAGNOSIS — R945 Abnormal results of liver function studies: Secondary | ICD-10-CM | POA: Diagnosis not present

## 2022-02-13 DIAGNOSIS — I2510 Atherosclerotic heart disease of native coronary artery without angina pectoris: Secondary | ICD-10-CM | POA: Diagnosis not present

## 2022-02-13 LAB — PROTIME-INR
INR: 1 (ref 0.8–1.2)
Prothrombin Time: 13.3 seconds (ref 11.4–15.2)

## 2022-02-13 LAB — CBC
HCT: 43.1 % (ref 39.0–52.0)
Hemoglobin: 13.9 g/dL (ref 13.0–17.0)
MCH: 28.7 pg (ref 26.0–34.0)
MCHC: 32.3 g/dL (ref 30.0–36.0)
MCV: 88.9 fL (ref 80.0–100.0)
Platelets: 393 10*3/uL (ref 150–400)
RBC: 4.85 MIL/uL (ref 4.22–5.81)
RDW: 15.2 % (ref 11.5–15.5)
WBC: 7 10*3/uL (ref 4.0–10.5)
nRBC: 0 % (ref 0.0–0.2)

## 2022-02-13 MED ORDER — FENTANYL CITRATE (PF) 100 MCG/2ML IJ SOLN
INTRAMUSCULAR | Status: AC
  Filled 2022-02-13: qty 2

## 2022-02-13 MED ORDER — FENTANYL CITRATE (PF) 100 MCG/2ML IJ SOLN
INTRAMUSCULAR | Status: AC | PRN
  Administered 2022-02-13: 50 ug via INTRAVENOUS

## 2022-02-13 MED ORDER — LIDOCAINE HCL (PF) 1 % IJ SOLN
INTRAMUSCULAR | Status: AC
  Filled 2022-02-13: qty 30

## 2022-02-13 MED ORDER — SODIUM CHLORIDE 0.9 % IV SOLN: INTRAVENOUS | Status: DC

## 2022-02-13 MED ORDER — GELATIN ABSORBABLE 12-7 MM EX MISC
CUTANEOUS | Status: AC
  Filled 2022-02-13: qty 1

## 2022-02-13 MED ORDER — MIDAZOLAM HCL 2 MG/2ML IJ SOLN
INTRAMUSCULAR | Status: AC | PRN
  Administered 2022-02-13: 1 mg via INTRAVENOUS

## 2022-02-13 MED ORDER — MIDAZOLAM HCL 2 MG/2ML IJ SOLN
INTRAMUSCULAR | Status: AC
  Filled 2022-02-13: qty 2

## 2022-02-13 NOTE — Procedures (Signed)
Interventional Radiology Procedure Note  Procedure: US LIVER RANDOM CORE BX    Complications: None  Estimated Blood Loss:  0  Findings: 73 G CORE X 2    M. Daryll Brod, MD

## 2022-02-13 NOTE — H&P (Signed)
Chief Complaint: Patient was seen in consultation today for No chief complaint on file.  at the request of [place] L  Referring Physician(s): Mahon,Courtney L  Supervising Physician: Daryll Brod  Patient Status: [facility] - Out-pt  History of Present Illness: Nicholas Avila is a 61 y.o. male with history of HTN, A. flutter, alcohol use, pulmonary nodules, tobacco use, and anemia.  He has demonstrated elevated LFTs and elevated IgG and positive ASMA.  Abdominal ultrasound 10/17/21 with hepatic steatosis, trace perihepatic ascites, mildly dilated CBD. MRCP obtained 11/01/2021 revealing no biliary ductal dilation and evidence of hepatomegaly with mild hepatic steatosis and trace perihepatic ascites. He presents for US guided liver biopsy.  Reports being nervous.  Endorses arthritic knee pain.  Has no other complaints.    Past Medical History:  Diagnosis Date   Alcoholism in remission ([place])    quit 12/10/17. longest time alcohol free. reports multiple previous DUIs (04/08/18)   Atrial flutter ([place])    a. diagnosed in 11/2017 during admission for Sepsis b. s/p successful DCCV in 01/2018.   Dysrhythmia    AFib   Pulmonary nodules    Tobacco use     Past Surgical History:  Procedure Laterality Date   CARDIOVERSION N/A 01/24/2018   Procedure: CARDIOVERSION;  Surgeon: Arnoldo Lenis, MD;  [facility];  Service: Endoscopy;  Laterality: N/A;   COLONOSCOPY WITH PROPOFOL N/A 05/23/2018   Procedure: COLONOSCOPY WITH PROPOFOL;  Surgeon: Daneil Dolin, MD;  [facility];  Service: Endoscopy;  Laterality: N/A;  8:30am   POLYPECTOMY  05/23/2018   Procedure: POLYPECTOMY;  Surgeon: Daneil Dolin, MD;  [facility];  Service: Endoscopy;;  cecal polyp, descending polyp    Allergies: Patient has no known allergies.  Medications: Prior to Admission medications   Medication Sig Start Date End Date Taking? Authorizing Provider  amLODipine (NORVASC) 10 MG  tablet Take 10 mg by mouth daily. 01/31/22  Yes [provider]  HYDROcodone-acetaminophen (NORCO/VICODIN) 5-325 MG tablet One tablet every six hours for pain.  Limit 7 days. 02/08/22  Yes Sanjuana Kava, MD  naproxen (NAPROSYN) 500 MG tablet Take 1 tablet (500 mg total) by mouth 2 (two) times daily with a meal. 01/18/22  Yes Sanjuana Kava, MD  sildenafil (VIAGRA) 100 MG tablet Take 100 mg by mouth daily as needed for erectile dysfunction. 02/01/22  Yes [provider]  spironolactone (ALDACTONE) 25 MG tablet Take 25 mg by mouth 2 (two) times daily. 12/22/21  Yes [provider]  Ipratropium-Albuterol (COMBIVENT RESPIMAT) 20-100 MCG/ACT AERS respimat Inhale 1 puff into the lungs every 6 (six) hours as needed for wheezing or shortness of breath. 12/12/21   Heath Lark D, DO     Family History  Problem Relation Age of Onset   Prostate cancer Father        late 15s   Heart disease Maternal Aunt    Colon cancer Neg Hx    Liver disease Neg Hx     Social History   Socioeconomic History   Marital status: Married    Spouse name: Not on file   Number of children: Not on file   Years of education: Not on file   Highest education level: Not on file  Occupational History   Not on file  Tobacco Use   Smoking status: Former    Packs/day: 0.75    Years: 35.00    Total pack years: 26.25    Types: Cigarettes  Smokeless tobacco: Former  Scientific laboratory technician Use: Never used  Substance and Sexual Activity   Alcohol use: Yes    Comment: 3-4, 12 oz beer per day.   Drug use: Yes    Types: Marijuana    Comment: last used 01/22/2018   Sexual activity: Not Currently  Other Topics Concern   Not on file  Social History Narrative   Not on file   Social Determinants of Health   Financial Resource Strain: Not on file  Food Insecurity: Unknown (12/11/2017)   Hunger Vital Sign    Worried About Running Out of Food in the Last Year: Patient refused    [place] in the  Last Year: Patient refused  Transportation Needs: Unknown (12/11/2017)   [place] - Hydrologist (Medical): Patient refused    Lack of Transportation (Non-Medical): Patient refused  Physical Activity: Unknown (12/11/2017)   Exercise Vital Sign    Days of Exercise per Week: Patient refused    Minutes of Exercise per Session: Patient refused  Stress: No Stress Concern Present (12/11/2017)   [employer] [place] of Stress : Not at all  Social Connections: Unknown (12/11/2017)   Social Connection and Isolation Panel [NHANES]    Frequency of Communication with Friends and Family: Patient refused    Frequency of Social Gatherings with Friends and Family: Patient refused    Attends Religious Services: Patient refused    Marine scientist or Organizations: Patient refused    Attends Archivist Meetings: Patient refused    Marital Status: Patient refused    Review of Systems: A 12 point ROS discussed and pertinent positives are indicated in the HPI above.  All other systems are negative.  Vital Signs: BP 115/69   Pulse 60   Temp 98 F (36.7 C) (Temporal)   Resp 18   Ht '5\' 10"'$  (1.778 m)   Wt 154 lb (69.9 kg)   SpO2 98%   BMI 22.10 kg/m    Physical Exam Vitals reviewed.  Constitutional:      General: He is not in acute distress. HENT:     Head: Normocephalic and atraumatic.     Mouth/Throat:     Mouth: Mucous membranes are moist.     Pharynx: Oropharynx is clear.  Eyes:     Extraocular Movements: Extraocular movements intact.     Conjunctiva/sclera: Conjunctivae normal.  Cardiovascular:     Rate and Rhythm: Normal rate and regular rhythm.     Pulses: Normal pulses.  Pulmonary:     Effort: Pulmonary effort is normal. No respiratory distress.  Abdominal:     General: Abdomen is flat.     Palpations: Abdomen is soft.  Skin:    General: Skin is warm and dry.   Neurological:     General: No focal deficit present.     Mental Status: He is alert and oriented to person, place, and time.  Psychiatric:        Mood and Affect: Mood normal.        Behavior: Behavior normal.     Imaging: DG KNEE 3 VIEW RIGHT  Result Date: 01/18/2022 Clinical:  pain right knee, some swelling, no trauma X-rays were done of the right knee, three views. There is mild degenerative changes of the right knee with slight medial narrowing.  No fracture or loose body noted.  Bone quality is good. A  small effusion is present. Mild arteriosclerosis is present of femoral and popliteal artery is present. Impression:  mild degenerative changes of the right knee, slight effusion, no fracture. Electronically Signed Sanjuana Kava, MD 9/6/20239:29 AM   Labs:  CBC: Recent Labs    12/11/21 0555 12/12/21 0559 01/03/22 1049 02/13/22 1142  WBC 21.4* 15.0* 10.0 7.0  HGB 7.5* 8.2* 10.5* 13.9  HCT 23.0* 25.1* 33.1* 43.1  PLT 347 401* 386.0 393    COAGS: Recent Labs    10/06/21 1130 12/04/21 0937 12/04/21 0944 02/13/22 1142  INR 1.2*  --  1.4* 1.0  APTT  --  37*  --   --     BMP: Recent Labs    12/09/21 0505 12/10/21 0523 12/11/21 0555 12/12/21 0559  NA 133* 134* 134* 135  K 3.4* 3.5 4.0 3.9  CL 102 104 105 103  CO2 '25 26 24 26  '$ GLUCOSE 107* 110* 97 99  BUN 5* 5* <5* 5*  CALCIUM 8.0* 8.1* 8.2* 8.2*  CREATININE 0.40* 0.39* 0.38* 0.47*  GFRNONAA >60 >60 >60 >60    LIVER FUNCTION TESTS: Recent Labs    12/04/21 0944 12/05/21 0418 12/06/21 0414 12/10/21 0523  BILITOT 4.5* 3.2* 3.3* 1.2  AST 108* 65* 50* 39  ALT 58* 38 36 23  ALKPHOS 185* 139* 163* 172*  PROT 6.6 5.2* 5.7* 5.8*  ALBUMIN 2.4* 1.8* 1.9* 1.8*    Assessment and Plan:  Elevated Liver Enzymes --persistent despite alcohol cessation --additionally elevation of IgG and positive ASMA   Thank you for this interesting consult.  I greatly enjoyed meeting Nicholas Avila and look forward to  participating in their care.  A copy of this report was sent to the requesting provider on this date.  Electronically Signed: Pasty Spillers, PA 02/13/2022, 12:47 PM   I spent a total of 30 Minutes in face to face in clinical consultation, greater than 50% of which was counseling/coordinating care for liver biopsy

## 2022-02-13 NOTE — Progress Notes (Addendum)
Pt up to BR-NAD-d/c home with adult male

## 2022-02-20 LAB — SURGICAL PATHOLOGY

## 2022-02-21 ENCOUNTER — Encounter: Payer: Self-pay | Admitting: Pulmonary Disease

## 2022-02-21 ENCOUNTER — Ambulatory Visit (INDEPENDENT_AMBULATORY_CARE_PROVIDER_SITE_OTHER): Payer: BC Managed Care – PPO

## 2022-02-21 ENCOUNTER — Ambulatory Visit (INDEPENDENT_AMBULATORY_CARE_PROVIDER_SITE_OTHER): Payer: BC Managed Care – PPO | Admitting: Pulmonary Disease

## 2022-02-21 VITALS — BP 120/64 | HR 81 | Temp 97.9°F | Ht 70.0 in | Wt 154.4 lb

## 2022-02-21 DIAGNOSIS — J13 Pneumonia due to Streptococcus pneumoniae: Secondary | ICD-10-CM

## 2022-02-21 DIAGNOSIS — J189 Pneumonia, unspecified organism: Secondary | ICD-10-CM | POA: Diagnosis not present

## 2022-02-21 DIAGNOSIS — Z720 Tobacco use: Secondary | ICD-10-CM | POA: Diagnosis not present

## 2022-02-21 DIAGNOSIS — J432 Centrilobular emphysema: Secondary | ICD-10-CM | POA: Diagnosis not present

## 2022-02-21 NOTE — Assessment & Plan Note (Signed)
He has quit for 2 months and I congratulated him on this.  He remains at high risk for relapse. We will refer him to the quit line and asked him to start on nicotine patches

## 2022-02-21 NOTE — Progress Notes (Signed)
   Subjective:    Patient ID: Nicholas Avila, male    DOB: Oct 07, 1960, 61 y.o.   MRN: [id number]  HPI  61 yo active smoker new to the pulmonary clinic and followed for emphysema as PMH - PAF, HTN, hx of ETOH abuse.    Chief Complaint  Patient presents with   Follow-up    Pt states he has been doing okay since last visit and denies any complaints.    hospitalized from 12/04/2021-12/12/2021 for sepsis secondary to RLL pneumococcal pna and parapneumonic effusion. He was treated with five days of ceftriaxone and azithromycin but had persistent leukocytosis  He then saw his PCP, Dr. Legrand Rams, on 8/3; still with some cough and CXR showed persistent RLL infiltrate. Treated with an additional 7 day course of levaquin 750 mg.  Accompanied by his wife today.  He has been able to quit smoking for 2 months, states that it has "rough".  He is also quit drinking. He underwent liver biopsy 10/2 which shows advanced fibrosis. He denies cough, sputum production, fevers or chest pain  Significant tests/ events reviewed 12/08/2021 CT chest wo contrast: borderline enlarged mediastinal and hilar LAD, likely reactive. Underlying emphysematous changes. There is extensive RML and RLL airspace consolidation. Small right sided parapneumonic effusion. Few patchy areas of inflammation or infection in both lungs.   Review of Systems neg for any significant sore throat, dysphagia, itching, sneezing, nasal congestion or excess/ purulent secretions, fever, chills, sweats, unintended wt loss, pleuritic or exertional cp, hempoptysis, orthopnea pnd or change in chronic leg swelling. Also denies presyncope, palpitations, heartburn, abdominal pain, nausea, vomiting, diarrhea or change in bowel or urinary habits, dysuria,hematuria, rash, arthralgias, visual complaints, headache, numbness weakness or ataxia.     Objective:   Physical Exam   Gen. Pleasant, well-nourished, in no distress ENT - no thrush, no pallor/icterus,no  post nasal drip Neck: No JVD, no thyromegaly, no carotid bruits Lungs: no use of accessory muscles, no dullness to percussion, rt basal crackles Cardiovascular: Rhythm regular, heart sounds  normal, no murmurs or gallops, no peripheral edema Musculoskeletal: No deformities, no cyanosis or clubbing         Assessment & Plan:

## 2022-02-21 NOTE — Assessment & Plan Note (Signed)
Chest x-ray dependently reviewed which shows almost complete resolution of right lower lobe pneumonia, no effusion, nipple shadow on left lung.

## 2022-02-21 NOTE — Patient Instructions (Signed)
  X chest x-ray today to follow-up on pneumonia x schedule PFTs  Congratulations on quitting cigarettes. Call [phone] quit line

## 2022-02-21 NOTE — Assessment & Plan Note (Signed)
PFTs will be scheduled. He would like to avoid maintenance inhaler.  Denies significant dyspnea or wheezing.

## 2022-02-22 ENCOUNTER — Other Ambulatory Visit: Payer: Self-pay | Admitting: *Deleted

## 2022-02-22 DIAGNOSIS — R7989 Other specified abnormal findings of blood chemistry: Secondary | ICD-10-CM

## 2022-02-22 DIAGNOSIS — K7460 Unspecified cirrhosis of liver: Secondary | ICD-10-CM

## 2022-02-23 ENCOUNTER — Other Ambulatory Visit: Payer: Self-pay | Admitting: Gastroenterology

## 2022-02-23 DIAGNOSIS — K7460 Unspecified cirrhosis of liver: Secondary | ICD-10-CM

## 2022-03-22 ENCOUNTER — Ambulatory Visit (INDEPENDENT_AMBULATORY_CARE_PROVIDER_SITE_OTHER): Payer: BC Managed Care – PPO | Admitting: Orthopaedic Surgery

## 2022-03-22 ENCOUNTER — Encounter: Payer: Self-pay | Admitting: Orthopaedic Surgery

## 2022-03-22 DIAGNOSIS — M545 Low back pain, unspecified: Secondary | ICD-10-CM

## 2022-03-22 DIAGNOSIS — M79605 Pain in left leg: Secondary | ICD-10-CM

## 2022-03-22 MED ORDER — HYDROCODONE-ACETAMINOPHEN 5-325 MG PO TABS: ORAL_TABLET | ORAL | 0 refills | Status: DC

## 2022-03-22 NOTE — Progress Notes (Signed)
My back is tight at times.  He has returned to work and is doing well.  He has some days where the back pain is worse.  He has no new trauma.  He has no paresthesias now.  Spine/Pelvis examination:  Inspection:  Overall, sacoiliac joint benign and hips nontender; without crepitus or defects.   Thoracic spine inspection: Alignment normal without kyphosis present   Lumbar spine inspection:  Alignment  with normal lumbar lordosis, without scoliosis apparent.   Thoracic spine palpation:  without tenderness of spinal processes   Lumbar spine palpation: without tenderness of lumbar area; without tightness of lumbar muscles    Range of Motion:   Lumbar flexion, forward flexion is normal without pain or tenderness    Lumbar extension is full without pain or tenderness   Left lateral bend is normal without pain or tenderness   Right lateral bend is normal without pain or tenderness   Straight leg raising is normal  Strength & tone: normal   Stability overall normal stability  There were no vitals taken for this visit.  Encounter Diagnosis  Name Primary?   Lumbar pain with radiation down left leg Yes   I have reviewed the [place] web site prior to prescribing narcotic medicine for this patient.  Return in six weeks.  Call if any problem.  Precautions discussed.  Electronically Signed Sanjuana Kava, MD 11/8/20239:23 AM

## 2022-03-24 ENCOUNTER — Ambulatory Visit (INDEPENDENT_AMBULATORY_CARE_PROVIDER_SITE_OTHER): Payer: BC Managed Care – PPO | Admitting: Pulmonary Disease

## 2022-03-24 DIAGNOSIS — J13 Pneumonia due to Streptococcus pneumoniae: Secondary | ICD-10-CM

## 2022-03-24 LAB — PULMONARY FUNCTION TEST
DL/VA % pred: 61 %
DL/VA: 2.61 ml/min/mmHg/L
DLCO cor % pred: 49 %
DLCO cor: 13.57 ml/min/mmHg
DLCO unc % pred: 48 %
DLCO unc: 13.29 ml/min/mmHg
FEF 25-75 Post: 1.4 L/sec
FEF 25-75 Pre: 1.73 L/sec
FEF2575-%Change-Post: -19 %
FEF2575-%Pred-Post: 46 %
FEF2575-%Pred-Pre: 58 %
FEV1-%Change-Post: -5 %
FEV1-%Pred-Post: 68 %
FEV1-%Pred-Pre: 72 %
FEV1-Post: 2.48 L
FEV1-Pre: 2.62 L
FEV1FVC-%Change-Post: 2 %
FEV1FVC-%Pred-Pre: 94 %
FEV6-%Change-Post: -5 %
FEV6-%Pred-Post: 73 %
FEV6-%Pred-Pre: 77 %
FEV6-Post: 3.34 L
FEV6-Pre: 3.55 L
FEV6FVC-%Change-Post: 2 %
FEV6FVC-%Pred-Post: 103 %
FEV6FVC-%Pred-Pre: 101 %
FVC-%Change-Post: -7 %
FVC-%Pred-Post: 71 %
FVC-%Pred-Pre: 76 %
FVC-Post: 3.4 L
FVC-Pre: 3.66 L
Post FEV1/FVC ratio: 73 %
Post FEV6/FVC ratio: 99 %
Pre FEV1/FVC ratio: 72 %
Pre FEV6/FVC Ratio: 97 %
RV % pred: 98 %
RV: 2.22 L
TLC % pred: 86 %
TLC: 6.05 L

## 2022-03-24 NOTE — Progress Notes (Signed)
PFT done today. 

## 2022-03-28 DIAGNOSIS — K7460 Unspecified cirrhosis of liver: Secondary | ICD-10-CM | POA: Diagnosis not present

## 2022-03-30 LAB — COMPREHENSIVE METABOLIC PANEL
AG Ratio: 1.3 (calc) (ref 1.0–2.5)
ALT: 12 U/L (ref 9–46)
AST: 21 U/L (ref 10–35)
Albumin: 4.3 g/dL (ref 3.6–5.1)
Alkaline phosphatase (APISO): 96 U/L (ref 35–144)
BUN: 12 mg/dL (ref 7–25)
CO2: 28 mmol/L (ref 20–32)
Calcium: 9.4 mg/dL (ref 8.6–10.3)
Chloride: 103 mmol/L (ref 98–110)
Creat: 0.74 mg/dL (ref 0.70–1.35)
Globulin: 3.3 g/dL (calc) (ref 1.9–3.7)
Glucose, Bld: 96 mg/dL (ref 65–99)
Potassium: 3.9 mmol/L (ref 3.5–5.3)
Sodium: 139 mmol/L (ref 135–146)
Total Bilirubin: 0.3 mg/dL (ref 0.2–1.2)
Total Protein: 7.6 g/dL (ref 6.1–8.1)

## 2022-03-30 LAB — IGG, IGA, IGM
IgG (Immunoglobin G), Serum: 1753 mg/dL — ABNORMAL HIGH (ref 600–1640)
IgM, Serum: 77 mg/dL (ref 50–300)
Immunoglobulin A: 286 mg/dL (ref 47–310)

## 2022-03-30 LAB — ANTI-SMOOTH MUSCLE ANTIBODY, IGG: Actin (Smooth Muscle) Antibody (IGG): <20 U (ref <20)

## 2022-04-02 NOTE — Progress Notes (Signed)
GI Office Note    Referring Provider: [name]* Primary Care Physician:  [name], MD Primary Gastroenterologist: Cristopher Estimable.Rourk, MD  Date:  04/03/2022  ID:  Nicholas Avila, DOB 01-30-1961, MRN [id number]   Chief Complaint   Chief Complaint  Patient presents with   Follow-up    History of Present Illness  Nicholas Avila is a 61 y.o. male with a history of alcohol abuse, atrial flutter, tobacco use, pulmonary nodules, HTN, anemia, history of adenomatous colon polyps, and newly diagnosed cirrhosis via liver biopsy and hepatomegaly via imaging presenting today to discuss cirrhosis further.   Last colonoscopy 05/23/2018: -6 mm tubular adenoma in the descending colon -sessile hyperplastic polyp in cecum -internal hemorrhoids -advised repeat colonoscopy in 6 months  Seen for initial consultation in May 20223 for elevated LFTs. Admitted to drinking about 3-4 (12oz) beers daily. Full workup ordered. Colonoscopy scheduled and patient cancelled  Labs May 2023: Hgb 10.2, AST, ALT, and alk phos improved. T.bili 0.9. Viral hepatitis panel negative. Iron and ferritin elevated. ANA, AMA negative. ASMA slightly positive. IgA and IgG elevated.  Abdominal U/S 10/17/21: hepatis steatosis, trace ascites, mildly dilated CBD. MRCP 11/01/21: no biliary ductal dilation, hepatomegaly with mild hepatic steatosis and trace ascites.  Hospitalized in July 2023 for diarrhea, chills, malaise, and fever. Found to have pneumonia and had elevated LFTs, increased bilirubin and IgG. Hemochromatosis DNA negative.   Last office visit 01/31/22. Denied any alcohol consumption since hospitalization. Had not yet returned to work. Denied withdrawal symptoms. Had been exercising regularly. Did report some pruritus. Diarrhea resolved. Previous history of change in bowel habits. Weight had increased. Liver biopsy discussed in detail with patient and he agreed. Declined colonoscopy until after liver  biopsy. Avoid NSAIDs.  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 Autoimmune feature not present however overlapping autoimmune hepatitis cannot be ruled out. Hgb 13.2, platelets 393  Labs 03/28/22: IgG 1753 (slightly elevated but improved from prior), ASMA negative, normal LFTs   Today: Hematemesis/coffee ground emesis: None History of variceal bleeding:  N/A Abdominal pain: only cramping with need to have a BM Abdominal distention/worsening ascites:  None Fever/chills:  None Episodes of confusion/disorientation: None Number of daily bowel movements: twice per day, firm stools. No straining.  Taking diuretics?: spironolactone 25 mg BID Date of last EGD: N/A Prior history of banding?: N/A Prior episodes of SBP: N/A Last time liver imaging was performed: MRI June 2023 MELD score:  6  No nausea vomiting, melena, brbpr, lack of appetite, early satiety. Denies overt dysphagia but cuts his foods into small bits. With work he gest at least 2 meals per day and has some snacks in between. Still working third shift. Has been having some lower extremity swelling and joints in his hands feel tight as well. Does have some occasionally itching and may be due to switching laundry detergents on a frequent basis.    Current Outpatient Medications  Medication Sig Dispense Refill   amLODipine (NORVASC) 10 MG tablet Take 10 mg by mouth daily.     HYDROcodone-acetaminophen (NORCO/VICODIN) 5-325 MG tablet One tablet every six hours for pain.  Limit 7 days. 28 tablet 0   naproxen (NAPROSYN) 500 MG tablet Take 1 tablet (500 mg total) by mouth 2 (two) times daily with a meal. 60 tablet 5   sildenafil (VIAGRA) 100 MG tablet Take 100 mg by mouth daily as needed for erectile dysfunction.     spironolactone (ALDACTONE) 25 MG  tablet Take 25 mg by mouth 2 (two) times daily.     No current facility-administered medications for this visit.    Past Medical  History:  Diagnosis Date   Alcoholism in remission ([place])    quit 12/10/17. longest time alcohol free. reports multiple previous DUIs (04/08/18)   Atrial flutter ([place])    a. diagnosed in 11/2017 during admission for Sepsis b. s/p successful DCCV in 01/2018.   Dysrhythmia    AFib   Pulmonary nodules    Tobacco use     Past Surgical History:  Procedure Laterality Date   CARDIOVERSION N/A 01/24/2018   Procedure: CARDIOVERSION;  Surgeon: Arnoldo Lenis, MD;  [facility];  Service: Endoscopy;  Laterality: N/A;   COLONOSCOPY WITH PROPOFOL N/A 05/23/2018   Procedure: COLONOSCOPY WITH PROPOFOL;  Surgeon: Daneil Dolin, MD;  [facility];  Service: Endoscopy;  Laterality: N/A;  8:30am   POLYPECTOMY  05/23/2018   Procedure: POLYPECTOMY;  Surgeon: Daneil Dolin, MD;  [facility];  Service: Endoscopy;;  cecal polyp, descending polyp    Family History  Problem Relation Age of Onset   Prostate cancer Father        late 57s   Heart disease Maternal Aunt    Colon cancer Neg Hx    Liver disease Neg Hx     Allergies as of 04/03/2022   (No Known Allergies)    Social History   Socioeconomic History   Marital status: Married    Spouse name: Not on file   Number of children: Not on file   Years of education: Not on file   Highest education level: Not on file  Occupational History   Not on file  Tobacco Use   Smoking status: Former    Packs/day: 0.75    Years: 35.00    Total pack years: 26.25    Types: Cigarettes   Smokeless tobacco: Former  Scientific laboratory technician Use: Never used  Substance and Sexual Activity   Alcohol use: Yes    Comment: 3-4, 12 oz beer per day.   Drug use: Yes    Types: Marijuana    Comment: last used 01/22/2018   Sexual activity: Not Currently  Other Topics Concern   Not on file  Social History Narrative   Not on file   Social Determinants of Health   Financial Resource Strain: Not on file  Food Insecurity: Unknown  (12/11/2017)   Hunger Vital Sign    Worried About Running Out of Food in the Last Year: Patient refused    [place] in the Last Year: Patient refused  Transportation Needs: Unknown (12/11/2017)   [place] - Hydrologist (Medical): Patient refused    Lack of Transportation (Non-Medical): Patient refused  Physical Activity: Unknown (12/11/2017)   Exercise Vital Sign    Days of Exercise per Week: Patient refused    Minutes of Exercise per Session: Patient refused  Stress: No Stress Concern Present (12/11/2017)   [place] of Stress : Not at all  Social Connections: Unknown (12/11/2017)   Social Connection and Isolation Panel [NHANES]    Frequency of Communication with Friends and Family: Patient refused    Frequency of Social Gatherings with Friends and Family: Patient refused    Attends Religious Services: Patient refused    Active Member of Clubs or Organizations: Patient refused  Attends Archivist Meetings: Patient refused    Marital Status: Patient refused     Review of Systems   Gen: Denies fever, chills, anorexia. Denies fatigue, weakness, weight loss.  CV: Denies chest pain, palpitations, syncope, peripheral edema, and claudication. Resp: Denies dyspnea at rest, cough, wheezing, coughing up blood, and pleurisy. GI: See HPI Derm: Denies rash, itching, dry skin Psych: Denies depression, anxiety, memory loss, confusion. No homicidal or suicidal ideation.  Heme: Denies bruising, bleeding, and enlarged lymph nodes.   Physical Exam   BP 116/75 (BP Location: Right Arm, Patient Position: Sitting, Cuff Size: Normal)   Pulse 60   Temp 97.9 F (36.6 C) (Temporal)   Ht _0  (1.778 m)   Wt 152 lb 9.6 oz (69.2 kg)   SpO2 96%   BMI 21.90 kg/m   General:   Alert and oriented. No distress noted. Pleasant and cooperative.  Head:  Normocephalic and  atraumatic. Eyes:  Conjuctiva clear without scleral icterus. Mouth:  Oral mucosa pink and moist. Good dentition. No lesions. Lungs:  Clear to auscultation bilaterally. No wheezes, rales, or rhonchi. No distress.  Heart:  S1, S2 present without murmurs appreciated.  Abdomen:  +BS, soft, non-tender and non-distended. No rebound or guarding. No HSM or masses noted. Rectal: deferred Msk:  Symmetrical without gross deformities. Normal posture. Extremities:  Without edema. Neurologic:  Alert and  oriented x4 Psych:  Alert and cooperative. Normal mood and affect.   Assessment  SIXTO BOWDISH is a 61 y.o. male with a history of alcohol abuse, atrial flutter, tobacco use, pulmonary nodules, HTN, anemia, history of adenomatous colon polyps, and newly diagnosed cirrhosis via liver biopsy and hepatomegaly via imaging presenting today to discuss cirrhosis further.   Advanced Fibrosis/early cirrhosis: Likely secondary to alcohol use. Previously elevated LFTs earlier this year. Hospitalization in July with pneumonia and elevated LFTs with mildly elevated ASMA and elevated IgA and IgG. No alcohol use since hospitalization in July 2023. Most recent labs with normal LFTs, negative ASMA, and only mildly elevated IgG. Liver biopsy performed 02/13/22 revealing advanced fibrosis (stage 3-4) /cirrhosis of unknown etiology however given alcohol history this is likely cause given now normal LFTs, negative ASMA, and normal IgA since alcohol cessation. Will start going back to the gym after he gets back into his work routine.  Overall feeling well, getting back into his work routine still with some fatigue.  Denies any jaundice, overt pruritus, confusion, melena, BRBPR, abdominal distention.  He does have some lower extremity swelling.  Believes he may be on spironolactone 25 mg twice daily but is unsure.  He will call with an updated med list.  Has very mild nonpitting edema to his bilateral lower extremities today given that  he has been up all night on his feet at work.  Does have some joint pain likely related to arthritis, has been following with orthopedics and is unsure if he is taking naproxen or what other medication he is taking for pain.  We discussed cirrhosis/fibrosis at length today including diet and lifestyle modifications needed.  He has remain abstinent from alcohol.  We will check MELD labs and abdominal ultrasound with elastography to assess for any improvement in fibrosis.   History of colon polyps: Last colonoscopy in 2020 multiple polyps (tubular adenoma and hyperplastic). Recommended repeat in 6 months. Currently overdue for surveillance.  Discussed importance of colonoscopy for colorectal cancer surveillance.  Patient prefers to hold off for now until liver issues stable and he feels  improved after going back to work.  We will reassess at follow-up in 6 months.  Alarm symptoms discussed with patient, and advised to call the office if he begins to have any.  PLAN   Check CBC, CMP, INR in 6 months Abdominal ultrasound with elastography in 6 months Spironolactone 25 mg BID Alcohol cessation Reduce salt intake to <2 g per day, follow heart healthy diet Tylenol max of 2 g per day (650 mg q8h) for pain Avoid NSAIDs for pain High-protein diet from a primarily plant-based diet. Avoid red meat.  No raw or undercooked meat, seafood, or shellfish Recommend at least 30 minutes of aerobic and resistance exercise 3 days/week. Call with updated med list. Recommended surveillance colonoscopy (pt deferred) Follow up in 6 months   [name], MSN, FNP-BC, AGACNP-BC [facility]

## 2022-04-03 ENCOUNTER — Encounter: Payer: Self-pay | Admitting: Gastroenterology

## 2022-04-03 ENCOUNTER — Ambulatory Visit (INDEPENDENT_AMBULATORY_CARE_PROVIDER_SITE_OTHER): Payer: BC Managed Care – PPO | Admitting: Gastroenterology

## 2022-04-03 ENCOUNTER — Telehealth: Payer: Self-pay | Admitting: *Deleted

## 2022-04-03 VITALS — BP 116/75 | HR 60 | Temp 97.9°F | Ht 70.0 in | Wt 152.6 lb

## 2022-04-03 DIAGNOSIS — K7402 Hepatic fibrosis, advanced fibrosis: Secondary | ICD-10-CM | POA: Diagnosis not present

## 2022-04-03 DIAGNOSIS — Z86010 Personal history of colonic polyps: Secondary | ICD-10-CM

## 2022-04-03 DIAGNOSIS — K7460 Unspecified cirrhosis of liver: Secondary | ICD-10-CM

## 2022-04-03 NOTE — Telephone Encounter (Signed)
Pt called and states he is taking Spironolactone '25mg'$  twice daily.

## 2022-04-03 NOTE — Telephone Encounter (Signed)
Noted  

## 2022-04-03 NOTE — Patient Instructions (Addendum)
Look to see if you are taking spironolactone and call with an updated med list.   Cirrhosis education:  Continue to avoid all alcohol High-protein diet from a primarily plant-based diet. Avoid red meat.  No raw or undercooked meat, seafood, or shellfish. Low-fat/cholesterol/carbohydrate diet. Limit sodium to no more than 2000 mg/day including everything that you eat and drink. Recommend at least 30 minutes of aerobic and resistance exercise 3 days/week. May take up to 2000 mg tylenol per day Avoid NSAIDs   You may use Hydrocortisone cream over the counter daily or twice daily as needed to the areas where you are itching. I suspect this may be related to changes in your laundry detergent or some other sort of environmental element.   We will plan to repeat your ultrasound and labs in 6 months and then see you in the office after.  If you change your mind on completing your colonoscopy, please reach out to the office.  I hope you have a great Thanksgiving, Christmas, and new year!  It was a pleasure to see you today. I want to create trusting relationships with patients. If you receive a survey regarding your visit,  I greatly appreciate you taking time to fill this out on paper or through your MyChart. I value your feedback.  [name], MSN, FNP-BC, AGACNP-BC [facility]

## 2022-04-24 NOTE — Telephone Encounter (Signed)
Patient is returning phone call for results. Patient phone number is [phone]. Would like results mailed to home address,

## 2022-05-03 ENCOUNTER — Ambulatory Visit (INDEPENDENT_AMBULATORY_CARE_PROVIDER_SITE_OTHER): Payer: BC Managed Care – PPO | Admitting: Orthopaedic Surgery

## 2022-05-03 ENCOUNTER — Encounter: Payer: Self-pay | Admitting: Orthopaedic Surgery

## 2022-05-03 VITALS — BP 122/66 | HR 65 | Ht 70.0 in | Wt 152.0 lb

## 2022-05-03 DIAGNOSIS — F17210 Nicotine dependence, cigarettes, uncomplicated: Secondary | ICD-10-CM | POA: Diagnosis not present

## 2022-05-03 DIAGNOSIS — M545 Low back pain, unspecified: Secondary | ICD-10-CM

## 2022-05-03 DIAGNOSIS — M79604 Pain in right leg: Secondary | ICD-10-CM

## 2022-05-03 MED ORDER — DICLOFENAC SODIUM 75 MG PO TBEC: 75.0000 mg | DELAYED_RELEASE_TABLET | Freq: Two times a day (BID) | ORAL | 2 refills | Status: DC

## 2022-05-03 MED ORDER — HYDROCODONE-ACETAMINOPHEN 5-325 MG PO TABS: ORAL_TABLET | ORAL | 0 refills | Status: DC

## 2022-05-03 NOTE — Progress Notes (Signed)
I still hurt at times  He has good and bad days with his lower back.  Cold weather makes it worse.  He has paresthesias to the right lower leg.  He had seen neurosurgeon but declined to have epidural.  He says the Naprosyn does not help.  He has no new trauma, no weakness.  Spine/Pelvis examination:  Inspection:  Overall, sacoiliac joint benign and hips nontender; without crepitus or defects.   Thoracic spine inspection: Alignment normal without kyphosis present   Lumbar spine inspection:  Alignment  with normal lumbar lordosis, without scoliosis apparent.   Thoracic spine palpation:  without tenderness of spinal processes   Lumbar spine palpation: without tenderness of lumbar area; without tightness of lumbar muscles    Range of Motion:   Lumbar flexion, forward flexion is normal without pain or tenderness    Lumbar extension is full without pain or tenderness   Left lateral bend is normal without pain or tenderness   Right lateral bend is normal without pain or tenderness   Straight leg raising is normal  Strength & tone: normal   Stability overall normal stability  Encounter Diagnoses  Name Primary?   Lumbar pain with radiation down right leg Yes   Nicotine dependence, cigarettes, uncomplicated    I will refill pain medicine.  I will change to diclofenac.  Return in six weeks.  He may need to reconsider epidural.  I have reviewed the [place] web site prior to prescribing narcotic medicine for this patient.  Call if any problem.  Precautions discussed.  Electronically Signed Sanjuana Kava, MD 12/20/20238:32 AM

## 2022-06-22 ENCOUNTER — Telehealth: Payer: Self-pay | Admitting: Orthopaedic Surgery

## 2022-06-22 MED ORDER — HYDROCODONE-ACETAMINOPHEN 5-325 MG PO TABS: ORAL_TABLET | ORAL | 0 refills | Status: DC

## 2022-06-22 NOTE — Telephone Encounter (Signed)
Sent to provider 

## 2022-06-22 NOTE — Telephone Encounter (Signed)
Patient presented to the office requesting a refill on his Hydrocodone 5-325 to be sent to [facility].

## 2022-07-13 ENCOUNTER — Encounter: Payer: Self-pay | Admitting: Radiology

## 2022-08-02 ENCOUNTER — Other Ambulatory Visit (INDEPENDENT_AMBULATORY_CARE_PROVIDER_SITE_OTHER): Payer: BC Managed Care – PPO

## 2022-08-02 ENCOUNTER — Encounter: Payer: Self-pay | Admitting: Orthopaedic Surgery

## 2022-08-02 ENCOUNTER — Ambulatory Visit (INDEPENDENT_AMBULATORY_CARE_PROVIDER_SITE_OTHER): Payer: BC Managed Care – PPO | Admitting: Orthopaedic Surgery

## 2022-08-02 VITALS — BP 130/66 | HR 54

## 2022-08-02 DIAGNOSIS — M542 Cervicalgia: Secondary | ICD-10-CM

## 2022-08-02 DIAGNOSIS — G8929 Other chronic pain: Secondary | ICD-10-CM

## 2022-08-02 DIAGNOSIS — M6281 Muscle weakness (generalized): Secondary | ICD-10-CM | POA: Diagnosis not present

## 2022-08-02 DIAGNOSIS — M5090 Cervical disc disorder, unspecified, unspecified cervical region: Secondary | ICD-10-CM

## 2022-08-02 DIAGNOSIS — R200 Anesthesia of skin: Secondary | ICD-10-CM

## 2022-08-02 DIAGNOSIS — R202 Paresthesia of skin: Secondary | ICD-10-CM

## 2022-08-02 MED ORDER — HYDROCODONE-ACETAMINOPHEN 5-325 MG PO TABS: ORAL_TABLET | ORAL | 0 refills | Status: DC

## 2022-08-02 NOTE — Patient Instructions (Signed)
You will have your imaging done at [facility]. Call and schedule your appointment within one week. We will work on your approval.   [facility] [address] [place] [address] PHONE: [phone]  

## 2022-08-02 NOTE — Progress Notes (Signed)
My hand goes numb  He has had problems with pain radiating from his neck to the right index finger and it is numb and burning at times.  He has no trauma.  This is getting worse.  He has noticed some weakness on the right hand grip.  He is right handed.  His back is not hurting as bad today.  Right shoulder has full motion.  DTRs are normal right.  He is weak in triceps muscle of 4 of 5 on right.  Grip is decreased on right.  He has numbness of dorsal right index finger.  He has no swelling, no redness.    Spine/Pelvis examination:  Inspection:  Overall, sacoiliac joint benign and hips nontender; without crepitus or defects.   Thoracic spine inspection: Alignment normal without kyphosis present   Lumbar spine inspection:  Alignment  with normal lumbar lordosis, without scoliosis apparent.   Thoracic spine palpation:  without tenderness of spinal processes   Lumbar spine palpation: without tenderness of lumbar area; without tightness of lumbar muscles    Range of Motion:   Lumbar flexion, forward flexion is normal without pain or tenderness    Lumbar extension is full without pain or tenderness   Left lateral bend is normal without pain or tenderness   Right lateral bend is normal without pain or tenderness   Straight leg raising is normal  Strength & tone: normal   Stability overall normal stability  Encounter Diagnoses  Name Primary?   Chronic neck pain with abnormal neurologic examination Yes   Right-sided muscle weakness    Cervical neck pain with evidence of disc disease    Numbness and tingling of right arm    I will get MRI of the cervical spine.  Return in three weeks. Continue the diclofenac.    I have reviewed the [place] web site prior to prescribing narcotic medicine for this patient.  Call if any problem.  Precautions discussed.  Electronically Signed Sanjuana Kava, MD 3/20/20249:22 AM

## 2022-08-13 ENCOUNTER — Ambulatory Visit
Admission: RE | Admit: 2022-08-13 | Discharge: 2022-08-13 | Disposition: A | Discharge status: Home / Self Care | Payer: BC Managed Care – PPO | Source: Ambulatory Visit | Attending: Orthopaedic Surgery | Admitting: Orthopaedic Surgery

## 2022-08-13 DIAGNOSIS — M47812 Spondylosis without myelopathy or radiculopathy, cervical region: Secondary | ICD-10-CM | POA: Diagnosis not present

## 2022-08-13 DIAGNOSIS — M5090 Cervical disc disorder, unspecified, unspecified cervical region: Secondary | ICD-10-CM

## 2022-08-13 DIAGNOSIS — G8929 Other chronic pain: Secondary | ICD-10-CM

## 2022-08-13 DIAGNOSIS — M6281 Muscle weakness (generalized): Secondary | ICD-10-CM

## 2022-08-13 DIAGNOSIS — M4802 Spinal stenosis, cervical region: Secondary | ICD-10-CM | POA: Diagnosis not present

## 2022-08-13 DIAGNOSIS — R200 Anesthesia of skin: Secondary | ICD-10-CM

## 2022-08-15 ENCOUNTER — Encounter: Payer: Self-pay | Admitting: Orthopaedic Surgery

## 2022-08-15 ENCOUNTER — Ambulatory Visit (INDEPENDENT_AMBULATORY_CARE_PROVIDER_SITE_OTHER): Payer: BC Managed Care – PPO | Admitting: Orthopaedic Surgery

## 2022-08-15 DIAGNOSIS — M4802 Spinal stenosis, cervical region: Secondary | ICD-10-CM | POA: Diagnosis not present

## 2022-08-15 DIAGNOSIS — M5030 Other cervical disc degeneration, unspecified cervical region: Secondary | ICD-10-CM | POA: Diagnosis not present

## 2022-08-15 MED ORDER — HYDROCODONE-ACETAMINOPHEN 5-325 MG PO TABS: ORAL_TABLET | ORAL | 0 refills | Status: DC

## 2022-08-15 NOTE — Progress Notes (Signed)
My arm is hurting and my neck too.  He had MRI of the cervical spine showing: IMPRESSION: 1. Degenerative cervical spondylosis with multilevel disc disease and facet disease. 2. Mild spinal stenosis and mild left foraminal stenosis at C3-4. 3. Moderate multifactorial spinal stenosis, moderate left foraminal stenosis and moderately severe right foraminal stenosis at C4-5.  I have explained the findings to him.  I will have him see neurosurgery.  He is agreeable to this.  I have independently reviewed the MRI.    He has full ROM of the neck but pain running down to the dorsum of the right hand and some weakness of the triceps.  Grip is decreased on right dominant side.  Encounter Diagnoses  Name Primary?   Spinal stenosis, cervical region Yes   DDD (degenerative disc disease), cervical    To see neurosurgeon.  I have reviewed the [place] web site prior to prescribing narcotic medicine for this patient.  Call if any problem.  Precautions discussed.  Electronically Signed Sanjuana Kava, MD 4/2/20249:52 AM

## 2022-08-15 NOTE — Patient Instructions (Signed)
You have been referred to [facility] on [address] in [place], they will call you with appointment. If you have not heard anything in one week call them to schedule [phone]   

## 2022-08-23 ENCOUNTER — Ambulatory Visit: Payer: BC Managed Care – PPO | Admitting: Nurse Practitioner

## 2022-08-28 ENCOUNTER — Telehealth: Payer: Self-pay | Admitting: Orthopaedic Surgery

## 2022-08-28 NOTE — Telephone Encounter (Signed)
Dr. Sanjuan Dame pt - spoke w/the patient, he stated that he called [facility] because he hasn't heard from them and they are telling him that they have not received a referral from our office.  The patient would like a call back [phone]

## 2022-08-28 NOTE — Telephone Encounter (Signed)
Spoke with patient. Advised patient that referral has been sent. Referral was sent last week/uploaded to [facility]. Patient will call back to schedule his appt.

## 2022-08-31 ENCOUNTER — Encounter: Payer: Self-pay | Admitting: *Deleted

## 2022-08-31 ENCOUNTER — Telehealth: Payer: Self-pay | Admitting: Internal Medicine

## 2022-08-31 ENCOUNTER — Encounter: Payer: Self-pay | Admitting: Internal Medicine

## 2022-08-31 NOTE — Telephone Encounter (Signed)
Me and tammy don't do lab recalls. That needs to go to the providers nurse thanks!

## 2022-08-31 NOTE — Telephone Encounter (Signed)
6 month follow up - recall to get CBC, CMP, PT/INR, RUQ U/S w/ elastography

## 2022-09-01 NOTE — Telephone Encounter (Signed)
recall to get CBC, CMP, PT/INR,

## 2022-09-01 NOTE — Telephone Encounter (Signed)
Noted. I have the labs ready to be mailed

## 2022-09-05 ENCOUNTER — Ambulatory Visit (INDEPENDENT_AMBULATORY_CARE_PROVIDER_SITE_OTHER): Payer: BC Managed Care – PPO | Admitting: Nurse Practitioner

## 2022-09-05 ENCOUNTER — Telehealth: Payer: Self-pay | Admitting: *Deleted

## 2022-09-05 ENCOUNTER — Other Ambulatory Visit: Payer: Self-pay | Admitting: *Deleted

## 2022-09-05 ENCOUNTER — Encounter: Payer: Self-pay | Admitting: Nurse Practitioner

## 2022-09-05 VITALS — BP 120/58 | HR 55 | Temp 98.1°F | Ht 70.0 in | Wt 141.6 lb

## 2022-09-05 DIAGNOSIS — F17210 Nicotine dependence, cigarettes, uncomplicated: Secondary | ICD-10-CM

## 2022-09-05 DIAGNOSIS — J432 Centrilobular emphysema: Secondary | ICD-10-CM

## 2022-09-05 DIAGNOSIS — K7460 Unspecified cirrhosis of liver: Secondary | ICD-10-CM

## 2022-09-05 DIAGNOSIS — Z720 Tobacco use: Secondary | ICD-10-CM

## 2022-09-05 MED ORDER — NICOTINE 7 MG/24HR TD PT24: 7.0000 mg | MEDICATED_PATCH | Freq: Every day | TRANSDERMAL | 0 refills | Status: DC

## 2022-09-05 MED ORDER — NICOTINE 14 MG/24HR TD PT24: 14.0000 mg | MEDICATED_PATCH | Freq: Every day | TRANSDERMAL | 0 refills | Status: DC

## 2022-09-05 NOTE — Assessment & Plan Note (Signed)
Counseled on smoking cessation. Rx nicotine patches. Referred to lung cancer screening program.  The patient's current tobacco use: 5-6 cigs/day The patient was advised to quit and impact of smoking on their health.  I assessed the patient's willingness to attempt to quit. I provided methods and skills for cessation. We reviewed medication management of smoking session drugs if appropriate. Resources to help quit smoking were provided. A smoking cessation quit date was set: 10/05/2022 Follow-up was arranged in our clinic.  The amount of time spent counseling patient was 4 mins

## 2022-09-05 NOTE — Telephone Encounter (Signed)
Mailed lab requisitions, to have completed in May.

## 2022-09-05 NOTE — Patient Instructions (Addendum)
Referral to lung cancer screening program   Nicotine patches - use 14 mg patch daily for 6 weeks then decrease to 7 mcg patch daily until you quit smoking.  Goal quit date: 10/05/2022 Call the 1-800-QUITNOW line if you are unable to afford the nicotine patches I sent in   Follow up in 6 months with Dr. Vassie Loll. If symptoms worsen, please contact office for sooner follow up or seek emergency care.

## 2022-09-05 NOTE — Assessment & Plan Note (Signed)
Asymptomatic at this time. Reviewed disease progression/prognosis. He would like to stay off maintenance inhalers at this time, which I think is reasonable given his lack of symptoms and no exacerbations. Will continue to monitor. Action plan in place.   Patient Instructions  Referral to lung cancer screening program   Nicotine patches - use 14 mg patch daily for 6 weeks then decrease to 7 mcg patch daily until you quit smoking.  Goal quit date: 10/05/2022 Call the 1-800-QUITNOW line if you are unable to afford the nicotine patches I sent in   Follow up in 6 months with Dr. Vassie Loll. If symptoms worsen, please contact office for sooner follow up or seek emergency care.

## 2022-09-05 NOTE — Progress Notes (Signed)
@Patient  ID: Nicholas Avila, male    DOB: [birth year], 62 y.o.   MRN: [id number]  Chief Complaint  Patient presents with   Follow-up    Pt states he has been doing okay since last visit. States he is coughing some.    Referring provider: Benetta Spar*  HPI: 62 year old male, active smoker new to the pulmonary clinic and followed for emphysema as well as recent right lower lobe pneumococcal pneumonia. Past medical history significant for PAF, HTN, hx of ETOH abuse.   He was hospitalized from 12/04/2021-12/12/2021 for sepsis secondary to RLL pneumococcal pna and parapneumonic effusion. He was treated with five days of ceftriaxone and azithromycin but had persistent leukocytosis. Recommendation from PCCM was to continue IV abx and repeat imaging. He began to improve over the next few days and was successfully weaned off O2. He was discharged on 3 days of augmentin to complete total 10 day course.   TEST/EVENTS:  12/08/2021 CT chest wo contrast: borderline enlarged mediastinal and hilar LAD, likely reactive. Underlying emphysematous changes. There is extensive RML and RLL airspace consolidation. Small right sided parapneumonic effusion. Few patchy areas of inflammation or infection in both lungs.  12/11/2021 CXR: Worsening infiltrate in the RML and RLL with a small right pleural effusion.  12/15/2021 CXR: there is a persistent infiltrate in the RLL; improved infiltrate in the RML. Improved right pleural effusion.   01/03/2022: Sudie Grumbling with Cheryn Lundquist NP for hospital follow up. He was discharged on 7/31 after being admitted for sepsis secondary to pneumococcal pneumonia. He was seen by his PCP on 8/3 with repeat CXR which showed persistent RLL infiltrate, improving RML infiltrate and improved pleural effusion. He was treated with an additional 7 days of levaquin 750 mg, which he completed around a week ago. Today, he reports that he is feeling much better since being discharged. He feels like his breathing  is back to his baseline; hasn't had to use his combivent. Energy is improving; still feeling a little more fatigued from his baseline but getting better every day. He has been able to return to the gym and is exercising a few times a week. Cough has mostly resolved and he hasn't had any significant chest congestion. Denies any fevers, chills, night sweats, wheezing, hemoptysis, anorexia. He is no longer taking any robitussin. Occasionally uses his neb treatment but not daily. No concerns or complaints today.   02/21/2022: OV with Dr. Vassie Loll. Hospitalized July 2023 for sepsis secondary to RLL pneumococcal pna, parapneumonic effusion. Persistent symptoms and treated by PCP with levaquin. Quit smoking 2 months ago; referred to quit now line for nicotine patches. PFTs ordered. Denies dyspnea or wheezing. Like to avoid maintenance inhaler. CXR with almost complete resolution.  09/05/2022: Today - follow up Patient presents today for 6 month follow up. He had PFTs after his last visit with FEV1 72%, ratio 73, and DLCO 49%. Told by Dr. Vassie Loll findings consistent with emphysema. He tells me today that he has been doing well. No symptoms of dyspnea, wheezing, or activity intolerance. He has an occasional cough without sputum production, which he relates to smoking. No chest congestion. He did pick up smoking again - 5-6 cigarettes a day. Wants to work on quitting again. He would like to avoid adding any other medications or inhalers at this time. Ok with restarting nicotine patches.    No Known Allergies   There is no immunization history on file for this patient.  Past Medical History:  Diagnosis Date  Alcoholism in remission    quit 12/10/17. longest time alcohol free. reports multiple previous DUIs (04/08/18)   Atrial flutter    a. diagnosed in 11/2017 during admission for Sepsis b. s/p successful DCCV in 01/2018.   Dysrhythmia    AFib   Pulmonary nodules    Tobacco use     Tobacco History: Social  History   Tobacco Use  Smoking Status Every Day   Packs/day: 0.75   Years: 35.00   Additional pack years: 0.00   Total pack years: 26.25   Types: Cigarettes  Smokeless Tobacco Former  Tobacco Comments   Currently smoking 1/4ppd as of 09/05/22   Ready to quit: Not Answered Counseling given: Not Answered Tobacco comments: Currently smoking 1/4ppd as of 09/05/22   Outpatient Medications Prior to Visit  Medication Sig Dispense Refill   amLODipine (NORVASC) 10 MG tablet Take 10 mg by mouth daily.     diclofenac (VOLTAREN) 75 MG EC tablet Take 1 tablet (75 mg total) by mouth 2 (two) times daily with a meal. 60 tablet 2   HYDROcodone-acetaminophen (NORCO/VICODIN) 5-325 MG tablet One tablet every six hours for pain.  Limit 7 days. 25 tablet 0   sildenafil (VIAGRA) 100 MG tablet Take 100 mg by mouth daily as needed for erectile dysfunction.     spironolactone (ALDACTONE) 25 MG tablet Take 25 mg by mouth 2 (two) times daily.     No facility-administered medications prior to visit.     Review of Systems:   Constitutional: No weight loss or gain, night sweats, fevers, chills, or lassitude, fatigue HEENT: No headaches, difficulty swallowing, tooth/dental problems, or sore throat. No sneezing, itching, ear ache, nasal congestion, or post nasal drip CV:  No chest pain, orthopnea, PND, swelling in lower extremities, anasarca, dizziness, palpitations, syncope Resp: No shortness of breath with exertion or at rest. No excess mucus or change in color of mucus. No productive or non-productive. No hemoptysis. No wheezing.  No chest wall deformity GI:  No heartburn, indigestion, abdominal pain, nausea, vomiting, diarrhea, change in bowel habits, loss of appetite, bloody stools.  GU: No dysuria, change in color of urine, urgency or frequency.  Neuro: No dizziness or lightheadedness.  Psych: No depression or anxiety. Mood stable.     Physical Exam:  BP (!) 120/58 (BP Location: Right Arm, Patient  Position: Sitting, Cuff Size: Normal)   Pulse (!) 55   Temp 98.1 F (36.7 C) (Oral)   Ht  (1.778 m)   Wt 141 lb 9.6 oz (64.2 kg)   SpO2 98% Comment: RA  BMI 20.32 kg/m   GEN: Pleasant, interactive, well-appearing; in no acute distress. HEENT:  Normocephalic and atraumatic. PERRLA. Sclera white. Nasal turbinates pink, moist and patent bilaterally. No rhinorrhea present. Oropharynx pink and moist, without exudate or edema. No lesions, ulcerations, or postnasal drip.  NECK:  Supple w/ fair ROM. No JVD present. Normal carotid impulses w/o bruits. Thyroid symmetrical with no goiter or nodules palpated. No lymphadenopathy.   CV: RRR, no m/r/g, no peripheral edema. Pulses intact, +2 bilaterally. No cyanosis, pallor or clubbing. PULMONARY:  Unlabored, regular breathing. Clear bilaterally A&P w/o wheezes/rales/rhonchi. No accessory muscle use. No dullness to percussion. GI: BS present and normoactive. Soft, non-tender to palpation. No organomegaly or masses detected. No CVA tenderness. MSK: No erythema, warmth or tenderness. Cap refil <2 sec all extrem. No deformities or joint swelling noted. Muscle wasting Neuro: A/Ox3. No focal deficits noted.   Skin: Warm, no lesions or rashe Psych:  Normal affect and behavior. Judgement and thought content appropriate.     Lab Results:  CBC    Component Value Date/Time   WBC 7.0 02/13/2022 1142   RBC 4.85 02/13/2022 1142   HGB 13.9 02/13/2022 1142   HCT 43.1 02/13/2022 1142   PLT 393 02/13/2022 1142   MCV 88.9 02/13/2022 1142   MCH 28.7 02/13/2022 1142   MCHC 32.3 02/13/2022 1142   RDW 15.2 02/13/2022 1142   LYMPHSABS 2.9 01/03/2022 1049   MONOABS 1.0 01/03/2022 1049   EOSABS 0.4 01/03/2022 1049   BASOSABS 0.1 01/03/2022 1049    BMET    Component Value Date/Time   NA 139 03/28/2022 1032   K 3.9 03/28/2022 1032   CL 103 03/28/2022 1032   CO2 28 03/28/2022 1032   GLUCOSE 96 03/28/2022 1032   BUN 12 03/28/2022 1032   CREATININE 0.74  03/28/2022 1032   CALCIUM 9.4 03/28/2022 1032   GFRNONAA >60 12/12/2021 0559   GFRAA >60 01/22/2018 1526    BNP No results found for: "BNP"   Imaging:  MR CERVICAL SPINE WO CONTRAST  Result Date: 08/15/2022 CLINICAL DATA:  Neck and right arm pain for several months. EXAM: MRI CERVICAL SPINE WITHOUT CONTRAST TECHNIQUE: Multiplanar, multisequence MR imaging of the cervical spine was performed. No intravenous contrast was administered. COMPARISON:  Cervical spine radiographs 08/02/2022 FINDINGS: Alignment: Normal overall alignment of the cervical vertebral bodies. Vertebrae: Degenerative endplate reactive changes but no bone lesions or fractures. Cord: Normal cord signal intensity.  No cord lesions or syrinx. Posterior Fossa, vertebral arteries, paraspinal tissues: No significant findings. Disc levels: C2-3: Degenerative disc disease and facet disease but no disc protrusions, significant spinal foraminal stenosis. C3-4: Advanced degenerative disc disease with bulging degenerated annulus, osteophytic ridging and uncinate spurring. There is flattening of the ventral thecal sac and near obliteration of the ventral CSF space. Mild left foraminal stenosis. C4-5: Advanced degenerative disc disease and facet disease. Bulging degenerated uncovered annulus, osteophytic ridging and uncinate spurring with moderate spinal stenosis, moderate left foraminal stenosis and moderately severe right foraminal stenosis. C5-6: Mild bulging annulus and moderate to advanced asymmetric right-sided facet disease but no significant disc protrusions, spinal or foraminal stenosis. C6-7: Bulging annulus but no disc protrusions, spinal or foraminal stenosis. C7-T1: Mild annular bulge but no spinal or foraminal stenosis. IMPRESSION: 1. Degenerative cervical spondylosis with multilevel disc disease and facet disease. 2. Mild spinal stenosis and mild left foraminal stenosis at C3-4. 3. Moderate multifactorial spinal stenosis, moderate left  foraminal stenosis and moderately severe right foraminal stenosis at C4-5. Electronically Signed   By: Rudie Meyer M.D.   On: 08/15/2022 08:45         Latest Ref Rng & Units 03/24/2022   10:54 AM  PFT Results  FVC-Pre L 3.66   FVC-Predicted Pre % 76   FVC-Post L 3.40   FVC-Predicted Post % 71   Pre FEV1/FVC % % 72   Post FEV1/FCV % % 73   FEV1-Pre L 2.62   FEV1-Predicted Pre % 72   FEV1-Post L 2.48   DLCO uncorrected ml/min/mmHg 13.29   DLCO UNC% % 48   DLCO corrected ml/min/mmHg 13.57   DLCO COR %Predicted % 49   DLVA Predicted % 61   TLC L 6.05   TLC % Predicted % 86   RV % Predicted % 98     No results found for: "NITRICOXIDE"      Assessment & Plan:   Centrilobular emphysema (HCC) Asymptomatic at this  time. Reviewed disease progression/prognosis. He would like to stay off maintenance inhalers at this time, which I think is reasonable given his lack of symptoms and no exacerbations. Will continue to monitor. Action plan in place.   Patient Instructions  Referral to lung cancer screening program   Nicotine patches - use 14 mg patch daily for 6 weeks then decrease to 7 mcg patch daily until you quit smoking.  Goal quit date: 10/05/2022 Call the 1-800-QUITNOW line if you are unable to afford the nicotine patches I sent in   Follow up in 6 months with Dr. Vassie Loll. If symptoms worsen, please contact office for sooner follow up or seek emergency care.    Tobacco abuse Counseled on smoking cessation. Rx nicotine patches. Referred to lung cancer screening program.  The patient's current tobacco use: 5-6 cigs/day The patient was advised to quit and impact of smoking on their health.  I assessed the patient's willingness to attempt to quit. I provided methods and skills for cessation. We reviewed medication management of smoking session drugs if appropriate. Resources to help quit smoking were provided. A smoking cessation quit date was set: 10/05/2022 Follow-up was  arranged in our clinic.  The amount of time spent counseling patient was 4 mins     I spent 35 minutes of dedicated to the care of this patient on the date of this encounter to include pre-visit review of records, face-to-face time with the patient discussing conditions above, post visit ordering of testing, clinical documentation with the electronic health record, making appropriate referrals as documented, and communicating necessary findings to members of the patients care team.  Noemi Chapel, NP 09/05/2022  Pt aware and understands NP's role.

## 2022-09-06 DIAGNOSIS — M47812 Spondylosis without myelopathy or radiculopathy, cervical region: Secondary | ICD-10-CM | POA: Diagnosis not present

## 2022-09-08 ENCOUNTER — Other Ambulatory Visit: Payer: Self-pay | Admitting: Neurosurgery

## 2022-09-08 DIAGNOSIS — M47812 Spondylosis without myelopathy or radiculopathy, cervical region: Secondary | ICD-10-CM

## 2022-09-14 ENCOUNTER — Telehealth: Payer: Self-pay | Admitting: *Deleted

## 2022-09-14 NOTE — Telephone Encounter (Signed)
Pt called and states he needs to make an OV for follow up cirrhosis. Last OV was 04/03/2022

## 2022-10-04 DIAGNOSIS — M47812 Spondylosis without myelopathy or radiculopathy, cervical region: Secondary | ICD-10-CM | POA: Diagnosis not present

## 2022-10-04 NOTE — Progress Notes (Unsigned)
GI Office Note    Referring Provider: Benetta Spar* Primary Care Physician:  Benetta Spar, MD Primary Gastroenterologist: Gerrit Friends.Rourk, MD  Date:  10/05/2022  ID:  Nicholas Avila, DOB 17-Dec-1960, MRN [id number]   Chief Complaint   Chief Complaint  Patient presents with   Follow-up    Pt to follow up up on cirrhosis   History of Present Illness  Nicholas Avila is a 62 y.o. male with a history of alcohol abuse, atrial flutter, tobacco use, pulmonary nodules, HTN, anemia, history of adenomatous colon polyps, and newly diagnosed cirrhosis via liver biopsy presenting today for cirrhosis follow up.  Last colonoscopy 05/23/2018: -6 mm tubular adenoma in the descending colon -sessile hyperplastic polyp in cecum -internal hemorrhoids -advised repeat colonoscopy in 6 months   Seen for initial consultation in May [address] for elevated LFTs. Admitted to drinking about 3-4 (12oz) beers daily. Full workup ordered. Colonoscopy scheduled and patient cancelled   Labs May 2023: Hgb 10.2, AST, ALT, and alk phos improved. T.bili 0.9. Viral hepatitis panel negative. Iron and ferritin elevated. ANA, AMA negative. ASMA slightly positive. IgA and IgG elevated.   Abdominal U/S 10/17/21: hepatis steatosis, trace ascites, mildly dilated CBD. MRCP 11/01/21: no biliary ductal dilation, hepatomegaly with mild hepatic steatosis and trace ascites.   Hospitalized in July 2023 for diarrhea, chills, malaise, and fever. Found to have pneumonia and had elevated LFTs, increased bilirubin and IgG. Hemochromatosis DNA negative.   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 Autoimmune feature not present however overlapping autoimmune hepatitis cannot be ruled out. Hgb 13.2, platelets 393   Labs 03/28/22: IgG 1753 (slightly elevated but improved from prior), ASMA negative, normal LFTs   Today: Cirrhosis history Hematemesis/coffee ground emesis:  No History of variceal bleeding: No Abdominal pain: No Abdominal distention/worsening ascites: No abdominal distention or lower extremity swelling.  Fever/chills: None Episodes of confusion/disorientation: None Number of daily bowel movements:  Taking diuretics?:  Spironolactone 25 mg twice daily - has not been taking since the holidays.  Date of last EGD: N/A -no evidence of portal hypertension or thrombocytopenia Prior history of banding?: No Prior episodes of SBP: N/A Last time liver imaging was performed:MRI June 2023 and US liver biopsy October 2023  Hepatitis A and B vaccination status: Immune to hepatitis A and B  MELD score: 6 at time of last office visit.   On vacation this week from work. Went to [employer] earlier this week. Has a lot of joint pain and concerned about possible injections in his back and neck. No recent alcohol. Started back tobacco use in January. Takes amlodipine about 3 times per week. Monitors his BP once per week at home. No jaundice, pruritus, mental status changes. No melena or brbpr.    Current Outpatient Medications  Medication Sig Dispense Refill   diclofenac (CATAFLAM) 50 MG tablet Take 50 mg by mouth 2 (two) times daily as needed.     diclofenac (VOLTAREN) 75 MG EC tablet Take 1 tablet (75 mg total) by mouth 2 (two) times daily with a meal. 60 tablet 2   sildenafil (VIAGRA) 100 MG tablet Take 100 mg by mouth daily as needed for erectile dysfunction.     amLODipine (NORVASC) 10 MG tablet Take 10 mg by mouth daily. (Patient not taking: Reported on 10/05/2022)     No current facility-administered medications for this visit.    Past Medical History:  Diagnosis Date   Alcoholism in remission (HCC)  quit 12/10/17. longest time alcohol free. reports multiple previous DUIs (04/08/18)   Atrial flutter (HCC)    a. diagnosed in 11/2017 during admission for Sepsis b. s/p successful DCCV in 01/2018.   Dysrhythmia    AFib   Pulmonary nodules     Tobacco use     Past Surgical History:  Procedure Laterality Date   CARDIOVERSION N/A 01/24/2018   Procedure: CARDIOVERSION;  Surgeon: Antoine Poche, MD;  [facility];  Service: Endoscopy;  Laterality: N/A;   COLONOSCOPY WITH PROPOFOL N/A 05/23/2018   Procedure: COLONOSCOPY WITH PROPOFOL;  Surgeon: Corbin Ade, MD;  Location: [facility];  Service: Endoscopy;  Laterality: N/A;  8:30am   POLYPECTOMY  05/23/2018   Procedure: POLYPECTOMY;  Surgeon: Corbin Ade, MD;  Location: [facility];  Service: Endoscopy;;  cecal polyp, descending polyp    Family History  Problem Relation Age of Onset   Prostate cancer Father        late 46s   Heart disease Maternal Aunt    Colon cancer Neg Hx    Liver disease Neg Hx     Allergies as of 10/05/2022   (No Known Allergies)    Social History   Socioeconomic History   Marital status: Married    Spouse name: Not on file   Number of children: Not on file   Years of education: Not on file   Highest education level: Not on file  Occupational History   Not on file  Tobacco Use   Smoking status: Every Day    Packs/day: 0.75    Years: 35.00    Additional pack years: 0.00    Total pack years: 26.25    Types: Cigarettes   Smokeless tobacco: Former   Tobacco comments:    Currently smoking 1/4ppd as of 09/05/22  Vaping Use   Vaping Use: Never used  Substance and Sexual Activity   Alcohol use: Yes    Comment: 3-4, 12 oz beer per day.   Drug use: Yes    Types: Marijuana    Comment: last used 01/22/2018   Sexual activity: Not Currently  Other Topics Concern   Not on file  Social History Narrative   Not on file   Social Determinants of Health   Financial Resource Strain: Not on file  Food Insecurity: Unknown (12/11/2017)   Hunger Vital Sign    Worried About Running Out of Food in the Last Year: Patient declined    Ran Out of Food in the Last Year: Patient declined  Transportation Needs: Unknown (12/11/2017)    PRAPARE - Administrator, Civil Service (Medical): Patient declined    Lack of Transportation (Non-Medical): Patient declined  Physical Activity: Unknown (12/11/2017)   Exercise Vital Sign    Days of Exercise per Week: Patient declined    Minutes of Exercise per Session: Patient declined  Stress: No Stress Concern Present (12/11/2017)   [employer] of Occupational Health - Occupational Stress Questionnaire    Feeling of Stress : Not at all  Social Connections: Unknown (12/11/2017)   Social Connection and Isolation Panel [NHANES]    Frequency of Communication with Friends and Family: Patient declined    Frequency of Social Gatherings with Friends and Family: Patient declined    Attends Religious Services: Patient declined    Database administrator or Organizations: Patient declined    Attends Banker Meetings: Patient declined    Marital Status: Patient declined  Review of Systems   Gen: + joint pain. Denies fever, chills, anorexia. Denies fatigue, weakness, weight loss.  CV: Denies chest pain, palpitations, syncope, peripheral edema, and claudication. Resp: Denies dyspnea at rest, cough, wheezing, coughing up blood, and pleurisy. GI: See HPI Derm: Denies rash, itching, dry skin Psych: Denies depression, anxiety, memory loss, confusion. No homicidal or suicidal ideation.  Heme: Denies bruising, bleeding, and enlarged lymph nodes.   Physical Exam   BP 129/70   Pulse (!) 45   Temp 98.1 F (36.7 C)   Ht 5\' 11"  (1.803 m)   Wt 133 lb 12.8 oz (60.7 kg)   BMI 18.66 kg/m   General:   Alert and oriented. No distress noted. Pleasant and cooperative.  Head:  Normocephalic and atraumatic. Eyes:  Conjuctiva clear without scleral icterus. Mouth:  Oral mucosa pink and moist. Good dentition. No lesions. Lungs:  Clear to auscultation bilaterally. No wheezes, rales, or rhonchi. No distress.  Heart:  S1, S2 present without murmurs appreciated.  Abdomen:   +BS, soft, non-tender and non-distended. No rebound or guarding. No HSM or masses noted. Rectal: deferred Msk:  Symmetrical without gross deformities. Normal posture. Extremities:  Without edema. Neurologic:  Alert and  oriented x4 Psych:  Alert and cooperative. Normal mood and affect.   Assessment  Nicholas Avila is a 62 y.o. male with a history of alcohol abuse, atrial flutter, tobacco use, pulmonary nodules, HTN, anemia, history of adenomatous colon polyps, and newly diagnosed cirrhosis via liver biopsy presenting today for cirrhosis follow up.  Early cirrhosis: Prior workup with negative ASMA, AMA, ANA, hep B, and hep C, and Hep A. Previously with elevated IgG however this is slowly down trended over time.  Etiology likely secondary to alcohol use.  Last year he had elevated ferritin with concern for iron overload and he underwent liver biopsy in October 2023 revealing advanced fibrosis/cirrhosis of unknown etiology.  Denies any jaundice, pruritus, confusion, abdominal distention, peripheral swelling, melena, or BRBPR.  Previously on spironolactone 25 mg twice daily for lower extremity swelling however he has not taken this since December/January and has not had any accumulation of ascites or peripheral swelling therefore we will continue to hold off on diuretics at this time.  He continues to be abstinent from alcohol.  He is currently overdue for liver imaging and MELD labs therefore we will order these today as well as AFP.  We discussed avoiding NSAIDs given his increased risk for bleeding.  History of colon polyps: Last colonoscopy in January 2020 with 6 mm tubular adenoma and hyperplastic polyp in the cecum.  He was originally advised to repeat this colonoscopy in 6 months and has deferred until now.  After discussion again today in the office regarding the importance of colon cancer screening and surveillance colonoscopy he has agreed to proceed.   PLAN   MELD labs and AFP today. Will  continue to hold spironolactone for now as now evidence of swelling.  RUQ [place] with elastography 2g sodium diet Avoid NSAIDs (should only use diclofenac sparingly if needed for severe arthritic pain) High-protein diet Alcohol cessation Proceed with colonoscopy with propofol by Dr. Jena Gauss in near future: the risks, benefits, and alternatives have been discussed with the patient in detail. The patient states understanding and desires to proceed. ASA 2 Follow up 6 months     Brooke Bonito, MSN, FNP-BC, AGACNP-BC [facility]

## 2022-10-05 ENCOUNTER — Telehealth: Payer: Self-pay | Admitting: *Deleted

## 2022-10-05 ENCOUNTER — Encounter: Payer: Self-pay | Admitting: Gastroenterology

## 2022-10-05 ENCOUNTER — Ambulatory Visit (INDEPENDENT_AMBULATORY_CARE_PROVIDER_SITE_OTHER): Payer: BC Managed Care – PPO | Admitting: Gastroenterology

## 2022-10-05 VITALS — BP 129/70 | HR 45 | Temp 98.1°F | Ht 71.0 in | Wt 133.8 lb

## 2022-10-05 DIAGNOSIS — K7460 Unspecified cirrhosis of liver: Secondary | ICD-10-CM | POA: Diagnosis not present

## 2022-10-05 DIAGNOSIS — Z86010 Personal history of colonic polyps: Secondary | ICD-10-CM

## 2022-10-05 NOTE — Telephone Encounter (Signed)
Spoke with pt and he is aware of his [place] appt details. He voiced understanding. Also aware will call him once I have Dr. Jena Gauss future schedule to schedule his TCS asa 2

## 2022-10-05 NOTE — Patient Instructions (Addendum)
Happy belated birthday!!  Cirrhosis Lifestyle Recommendations:  High-protein diet from a primarily plant-based diet. Avoid red meat.  No raw or undercooked meat, seafood, or shellfish. Low-fat/cholesterol/carbohydrate diet. Limit sodium to no more than 2000 mg/day including everything that you eat and drink. Recommend at least 30 minutes of aerobic and resistance exercise 3 days/week. Limit Tylenol to 2000 mg daily.  Avoid NSAIDs   Please have blood work completed at [facility].  We will call you with results once they have been received. Please allow 3-5 business days for review. 2 locations for [facility] in [place]:              1. [address], [place]              2. [address] [place], [place]   Please find paperwork at home regarding Quests issues with your prior lab work.   We will schedule you for a upper quadrant ultrasound to further evaluate your liver.  Will also schedule you for a colonoscopy in the near future with Dr. Jena Gauss.  Our schedulers will reach out to you with an appointment once we have the July schedule.  It was a pleasure to see you today. I want to create trusting relationships with patients. If you receive a survey regarding your visit,  I greatly appreciate you taking time to fill this out on paper or through your MyChart. I value your feedback.  Brooke Bonito, MSN, FNP-BC, AGACNP-BC [facility]

## 2022-10-11 DIAGNOSIS — D649 Anemia, unspecified: Secondary | ICD-10-CM | POA: Diagnosis not present

## 2022-10-11 DIAGNOSIS — K7460 Unspecified cirrhosis of liver: Secondary | ICD-10-CM | POA: Diagnosis not present

## 2022-10-12 ENCOUNTER — Ambulatory Visit (HOSPITAL_COMMUNITY)
Admission: RE | Admit: 2022-10-12 | Discharge: 2022-10-12 | Disposition: A | Discharge status: Home / Self Care | Payer: BC Managed Care – PPO | Source: Ambulatory Visit | Attending: Gastroenterology | Admitting: Gastroenterology

## 2022-10-12 DIAGNOSIS — K7460 Unspecified cirrhosis of liver: Secondary | ICD-10-CM | POA: Insufficient documentation

## 2022-10-12 LAB — CBC
Hematocrit: 42.3 % (ref 37.5–51.0)
Hemoglobin: 14.2 g/dL (ref 13.0–17.7)
MCH: 29.1 pg (ref 26.6–33.0)
MCHC: 33.6 g/dL (ref 31.5–35.7)
MCV: 87 fL (ref 79–97)
Platelets: 332 10*3/uL (ref 150–450)
RBC: 4.88 x10E6/uL (ref 4.14–5.80)
RDW: 14.1 % (ref 11.6–15.4)
WBC: 11 10*3/uL — ABNORMAL HIGH (ref 3.4–10.8)

## 2022-10-12 LAB — COMPREHENSIVE METABOLIC PANEL
ALT: 14 IU/L (ref 0–44)
AST: 21 IU/L (ref 0–40)
Albumin/Globulin Ratio: 1.7 (ref 1.2–2.2)
Albumin: 4.8 g/dL (ref 3.9–4.9)
Alkaline Phosphatase: 140 IU/L — ABNORMAL HIGH (ref 44–121)
BUN/Creatinine Ratio: 14 (ref 10–24)
BUN: 12 mg/dL (ref 8–27)
Bilirubin Total: 0.4 mg/dL (ref 0.0–1.2)
CO2: 24 mmol/L (ref 20–29)
Calcium: 9.8 mg/dL (ref 8.6–10.2)
Chloride: 100 mmol/L (ref 96–106)
Creatinine, Ser: 0.87 mg/dL (ref 0.76–1.27)
Globulin, Total: 2.8 g/dL (ref 1.5–4.5)
Glucose: 87 mg/dL (ref 70–99)
Potassium: 4.7 mmol/L (ref 3.5–5.2)
Sodium: 139 mmol/L (ref 134–144)
Total Protein: 7.6 g/dL (ref 6.0–8.5)
eGFR: 98 mL/min/{1.73_m2} (ref >59)

## 2022-10-12 LAB — PROTIME-INR
INR: 1 (ref 0.9–1.2)
Prothrombin Time: 10.6 s (ref 9.1–12.0)

## 2022-10-12 LAB — AFP TUMOR MARKER: AFP, Serum, Tumor Marker: 3.5 ng/mL (ref 0.0–8.4)

## 2022-10-13 ENCOUNTER — Ambulatory Visit
Admission: RE | Admit: 2022-10-13 | Discharge: 2022-10-13 | Disposition: A | Discharge status: Home / Self Care | Payer: BC Managed Care – PPO | Source: Ambulatory Visit | Attending: Neurosurgery | Admitting: Neurosurgery

## 2022-10-13 DIAGNOSIS — M47812 Spondylosis without myelopathy or radiculopathy, cervical region: Secondary | ICD-10-CM

## 2022-10-13 MED ORDER — IOPAMIDOL (ISOVUE-M 200) INJECTION 41%: 1.0000 mL | Freq: Once | INTRAMUSCULAR | Status: DC

## 2022-10-13 MED ORDER — IOPAMIDOL (ISOVUE-M 300) INJECTION 61%
1.0000 mL | Freq: Once | INTRAMUSCULAR | Status: AC | PRN
  Administered 2022-10-13: 1 mL via EPIDURAL

## 2022-10-13 MED ORDER — TRIAMCINOLONE ACETONIDE 40 MG/ML IJ SUSP (RADIOLOGY)
60.0000 mg | Freq: Once | INTRAMUSCULAR | Status: AC
  Administered 2022-10-13: 60 mg via EPIDURAL

## 2022-10-13 MED ORDER — METHYLPREDNISOLONE ACETATE 40 MG/ML INJ SUSP (RADIOLOG: 80.0000 mg | Freq: Once | INTRAMUSCULAR | Status: DC

## 2022-10-13 NOTE — Discharge Instructions (Signed)

## 2022-10-17 ENCOUNTER — Encounter: Payer: Self-pay | Admitting: *Deleted

## 2022-10-17 ENCOUNTER — Other Ambulatory Visit: Payer: Self-pay | Admitting: *Deleted

## 2022-10-17 DIAGNOSIS — D649 Anemia, unspecified: Secondary | ICD-10-CM

## 2022-10-17 DIAGNOSIS — K7460 Unspecified cirrhosis of liver: Secondary | ICD-10-CM

## 2022-10-20 MED ORDER — PEG 3350-KCL-NA BICARB-NACL 420 G PO SOLR: 4000.0000 mL | Freq: Once | ORAL | 0 refills | Status: AC

## 2022-10-20 NOTE — Addendum Note (Signed)
Addended by: Armstead Peaks on: 10/20/2022 08:24 AM   Modules accepted: Orders

## 2022-10-20 NOTE — Telephone Encounter (Signed)
Spoke with pt. He has been scheduled for 7/18. Aware will mail instructions to him. Rx for prep to be sent to pharmacy.

## 2022-11-01 ENCOUNTER — Encounter: Payer: Self-pay | Admitting: *Deleted

## 2022-11-30 ENCOUNTER — Encounter (HOSPITAL_COMMUNITY)
Admission: RE | Disposition: A | Discharge status: Home / Self Care | Payer: Self-pay | Source: Home / Self Care | Attending: Internal Medicine

## 2022-11-30 ENCOUNTER — Encounter (HOSPITAL_COMMUNITY): Payer: Self-pay | Admitting: Internal Medicine

## 2022-11-30 ENCOUNTER — Ambulatory Visit (HOSPITAL_COMMUNITY): Payer: BC Managed Care – PPO | Admitting: Anesthesiology

## 2022-11-30 ENCOUNTER — Ambulatory Visit (HOSPITAL_COMMUNITY)
Admission: RE | Admit: 2022-11-30 | Discharge: 2022-11-30 | Disposition: A | Discharge status: Home / Self Care | Payer: BC Managed Care – PPO | Attending: Internal Medicine | Admitting: Internal Medicine

## 2022-11-30 ENCOUNTER — Other Ambulatory Visit: Payer: Self-pay

## 2022-11-30 DIAGNOSIS — D128 Benign neoplasm of rectum: Secondary | ICD-10-CM | POA: Insufficient documentation

## 2022-11-30 DIAGNOSIS — F17200 Nicotine dependence, unspecified, uncomplicated: Secondary | ICD-10-CM | POA: Diagnosis not present

## 2022-11-30 DIAGNOSIS — K635 Polyp of colon: Secondary | ICD-10-CM | POA: Diagnosis not present

## 2022-11-30 DIAGNOSIS — Z1211 Encounter for screening for malignant neoplasm of colon: Secondary | ICD-10-CM | POA: Insufficient documentation

## 2022-11-30 DIAGNOSIS — D122 Benign neoplasm of ascending colon: Secondary | ICD-10-CM | POA: Insufficient documentation

## 2022-11-30 DIAGNOSIS — J449 Chronic obstructive pulmonary disease, unspecified: Secondary | ICD-10-CM | POA: Diagnosis not present

## 2022-11-30 DIAGNOSIS — Z86010 Personal history of colonic polyps: Secondary | ICD-10-CM | POA: Diagnosis not present

## 2022-11-30 DIAGNOSIS — F17210 Nicotine dependence, cigarettes, uncomplicated: Secondary | ICD-10-CM | POA: Diagnosis not present

## 2022-11-30 HISTORY — DX: Unspecified osteoarthritis, unspecified site: M19.90

## 2022-11-30 HISTORY — PX: POLYPECTOMY: SHX5525

## 2022-11-30 HISTORY — PX: COLONOSCOPY WITH PROPOFOL: SHX5780

## 2022-11-30 SURGERY — COLONOSCOPY WITH PROPOFOL
Anesthesia: General

## 2022-11-30 MED ORDER — LACTATED RINGERS IV SOLN
INTRAVENOUS | Status: DC
  Administered 2022-11-30: 1000 mL via INTRAVENOUS

## 2022-11-30 MED ORDER — PROPOFOL 10 MG/ML IV BOLUS
INTRAVENOUS | Status: DC | PRN
Start: 2022-11-30 — End: 2022-11-30
  Administered 2022-11-30 (×4): 50 mg via INTRAVENOUS
  Administered 2022-11-30: 100 mg via INTRAVENOUS

## 2022-11-30 MED ORDER — LIDOCAINE HCL (CARDIAC) PF 100 MG/5ML IV SOSY
PREFILLED_SYRINGE | INTRAVENOUS | Status: DC | PRN
  Administered 2022-11-30: 50 mg via INTRAVENOUS

## 2022-11-30 MED ORDER — PROPOFOL 500 MG/50ML IV EMUL
INTRAVENOUS | Status: DC | PRN
  Administered 2022-11-30: 250 ug/kg/min via INTRAVENOUS

## 2022-11-30 NOTE — Anesthesia Postprocedure Evaluation (Signed)
Anesthesia Post Note  Patient: Nicholas Avila  Procedure(s) Performed: COLONOSCOPY WITH PROPOFOL POLYPECTOMY  Patient location during evaluation: Phase II Anesthesia Type: General Level of consciousness: awake and alert and oriented Pain management: pain level controlled Vital Signs Assessment: post-procedure vital signs reviewed and stable Respiratory status: spontaneous breathing, nonlabored ventilation and respiratory function stable Cardiovascular status: blood pressure returned to baseline and stable Postop Assessment: no apparent nausea or vomiting Anesthetic complications: no  No notable events documented.   Last Vitals:  Vitals:   11/30/22 0710 11/30/22 0801  BP: 139/70 135/63  Pulse: (!) 45 (!) 52  Resp: 12 (!) 24  Temp: 37 C 36.6 C  SpO2: 98% 100%    Last Pain:  Vitals:   11/30/22 0803  TempSrc:   PainSc: 0-No pain                 Daejah Klebba C Shandiin Eisenbeis

## 2022-11-30 NOTE — Transfer of Care (Signed)
Immediate Anesthesia Transfer of Care Note  Patient: Nicholas Avila  Procedure(s) Performed: COLONOSCOPY WITH PROPOFOL POLYPECTOMY  Patient Location: Endoscopy Unit  Anesthesia Type:General  Level of Consciousness: drowsy  Airway & Oxygen Therapy: Patient Spontanous Breathing  Post-op Assessment: Report given to RN and Post -op Vital signs reviewed and stable  Post vital signs: Reviewed and stable  Last Vitals:  Vitals Value Taken Time  BP    Temp    Pulse    Resp    SpO2      Last Pain:  Vitals:   11/30/22 0710  TempSrc: Oral  PainSc: 0-No pain      Patients Stated Pain Goal: 7 (11/30/22 0710)  Complications: No notable events documented.

## 2022-11-30 NOTE — Anesthesia Preprocedure Evaluation (Addendum)
Anesthesia Evaluation  Patient identified by MRN, date of birth, ID band Patient awake    Reviewed: Allergy & Precautions, H&P , NPO status , Patient's Chart, lab work & pertinent test results  Airway Mallampati: II  TM Distance: >3 FB Neck ROM: Full    Dental  (+) Missing, Dental Advisory Given   Pulmonary pneumonia, COPD, Current Smoker   Pulmonary exam normal breath sounds clear to auscultation       Cardiovascular Exercise Tolerance: Good hypertension, Pt. on medications Normal cardiovascular exam+ dysrhythmias Atrial Fibrillation  Rhythm:Regular Rate:Bradycardia     Neuro/Psych  PSYCHIATRIC DISORDERS      negative neurological ROS     GI/Hepatic negative GI ROS,,,(+)     substance abuse  alcohol use  Endo/Other  negative endocrine ROS    Renal/GU negative Renal ROS  negative genitourinary   Musculoskeletal  (+) Arthritis ,    Abdominal   Peds negative pediatric ROS (+)  Hematology  (+) Blood dyscrasia, anemia   Anesthesia Other Findings   Reproductive/Obstetrics negative OB ROS                             Anesthesia Physical Anesthesia Plan  ASA: 2  Anesthesia Plan: General   Post-op Pain Management: Minimal or no pain anticipated   Induction: Intravenous  PONV Risk Score and Plan: Treatment may vary due to age or medical condition  Airway Management Planned: Nasal Cannula and Natural Airway  Additional Equipment:   Intra-op Plan:   Post-operative Plan:   Informed Consent: I have reviewed the patients History and Physical, chart, labs and discussed the procedure including the risks, benefits and alternatives for the proposed anesthesia with the patient or authorized representative who has indicated his/her understanding and acceptance.     Dental advisory given  Plan Discussed with: CRNA and Surgeon  Anesthesia Plan Comments:        Anesthesia Quick  Evaluation

## 2022-11-30 NOTE — Op Note (Signed)
[facility] Patient Name: Nicholas Avila Procedure Date: 11/30/2022 7:02 AM MRN: [id number] Date of Birth: 20-Aug-1960 Attending MD: Gennette Pac , MD, [id number] CSN: [id number] Age: 61 Admit Type: Outpatient Procedure:                Colonoscopy Indications:              High risk colon cancer surveillance: Personal                            history of colonic polyps Providers:                Gennette Pac, MD, Enzo Montgomery RN, RN,                            Lennice Sites Technician, Technician Referring MD:              Medicines:                Propofol per Anesthesia Complications:            No immediate complications. Estimated Blood Loss:     Estimated blood loss was minimal. Procedure:                Pre-Anesthesia Assessment:                           - Prior to the procedure, a History and Physical                            was performed, and patient medications and                            allergies were reviewed. The patient's tolerance of                            previous anesthesia was also reviewed. The risks                            and benefits of the procedure and the sedation                            options and risks were discussed with the patient.                            All questions were answered, and informed consent                            was obtained. Prior Anticoagulants: The patient has                            taken no anticoagulant or antiplatelet agents. ASA                            Grade Assessment: II - A patient with mild systemic  disease. After reviewing the risks and benefits,                            the patient was deemed in satisfactory condition to                            undergo the procedure.                           After obtaining informed consent, the colonoscope                            was passed under direct vision. Throughout the                             procedure, the patient's blood pressure, pulse, and                            oxygen saturations were monitored continuously. The                            [id number]) scope was introduced through the                            anus and advanced to the the cecum, identified by                            appendiceal orifice and ileocecal valve. The                            colonoscopy was performed without difficulty. The                            patient tolerated the procedure well. The quality                            of the bowel preparation was adequate. The                            ileocecal valve, appendiceal orifice, and rectum                            were photographed. Scope In: 7:35:16 AM Scope Out: 7:57:26 AM Scope Withdrawal Time: 0 hours 18 minutes 25 seconds  Total Procedure Duration: 0 hours 22 minutes 10 seconds  Findings:      The perianal and digital rectal examinations were normal. Thorough       examination of a deeply intubated cecum revealed no evidence of residual       polyp from the prior large cecal polypectomy.      A 5 mm polyp was found in the ascending colon. The polyp was sessile.       The polyp was removed with a cold snare. Resection and retrieval were       complete. Estimated blood loss was minimal.      A 9  mm polyp was found in the proximal rectum. The polyp was       pedunculated. The polyp was removed with a hot snare. Resection and       retrieval were complete. Estimated blood loss: none.      The exam was otherwise without abnormality. Impression:               - One 5 mm polyp in the ascending colon, removed                            with a cold snare. Resected and retrieved.                           - One 9 mm polyp in the proximal rectum, removed                            with a hot snare. Resected and retrieved.                           - The examination was otherwise normal. Moderate Sedation:      Moderate  (conscious) sedation was personally administered by an       anesthesia professional. The following parameters were monitored: oxygen       saturation, heart rate, blood pressure, respiratory rate, EKG, adequacy       of pulmonary ventilation, and response to care. Recommendation:           - Patient has a contact number available for                            emergencies. The signs and symptoms of potential                            delayed complications were discussed with the                            patient. Return to normal activities tomorrow.                            Written discharge instructions were provided to the                            patient.                           - Advance diet as tolerated.                           - Continue present medications.                           - Repeat colonoscopy date to be determined after                            pending pathology results are reviewed for  surveillance.                           - Return to GI office (date not yet determined). Procedure Code(s):        --- Professional ---                           [phone], Colonoscopy, flexible; with removal of                            tumor(s), polyp(s), or other lesion(s) by snare                            technique Diagnosis Code(s):        --- Professional ---                           Z86.010, Personal history of colonic polyps                           D12.2, Benign neoplasm of ascending colon                           D12.8, Benign neoplasm of rectum CPT copyright 2022 American Medical Association. All rights reserved. The codes documented in this report are preliminary and upon coder review may  be revised to meet current compliance requirements. Gerrit Friends. Paylin Hailu, MD Gennette Pac, MD 11/30/2022 8:08:04 AM This report has been signed electronically. Number of Addenda: 0

## 2022-11-30 NOTE — H&P (Signed)
@LOGO @   Primary Care Physician:  Benetta Spar, MD Primary Gastroenterologist:  Dr. Jena Gauss  Pre-Procedure History & Physical: HPI:  Nicholas Avila is a 62 y.o. male here for surveillance colonoscopy.  Overdue piecemeal removal of large cecal polyp 2020.-Adenoma.  No high-grade dysplasia  Past Medical History:  Diagnosis Date   Alcoholism in remission (HCC)    quit 12/10/17. longest time alcohol free. reports multiple previous DUIs (04/08/18)   Arthritis    Atrial flutter (HCC)    a. diagnosed in 11/2017 during admission for Sepsis b. s/p successful DCCV in 01/2018.   Dysrhythmia    AFib   Pulmonary nodules    Tobacco use     Past Surgical History:  Procedure Laterality Date   CARDIOVERSION N/A 01/24/2018   Procedure: CARDIOVERSION;  Surgeon: Antoine Poche, MD;  [facility];  Service: Endoscopy;  Laterality: N/A;   COLONOSCOPY WITH PROPOFOL N/A 05/23/2018   Procedure: COLONOSCOPY WITH PROPOFOL;  Surgeon: Corbin Ade, MD;  [facility];  Service: Endoscopy;  Laterality: N/A;  8:30am   POLYPECTOMY  05/23/2018   Procedure: POLYPECTOMY;  Surgeon: Corbin Ade, MD;  [facility];  Service: Endoscopy;;  cecal polyp, descending polyp    Prior to Admission medications   Medication Sig Start Date End Date Taking? Authorizing Provider  amLODipine (NORVASC) 10 MG tablet Take 10 mg by mouth 2 (two) times a week. 01/31/22  Yes [provider]  carboxymethylcellulose (REFRESH PLUS) 0.5 % SOLN Place 1 drop into both eyes every evening.   Yes [provider]  diclofenac (CATAFLAM) 50 MG tablet Take 50 mg by mouth daily as needed (pain.). 09/06/22  Yes [provider]  naproxen sodium (ALEVE) 220 MG tablet Take 220 mg by mouth 2 (two) times daily as needed (toothache/pain.).   Yes [provider]  polyethylene glycol-electrolytes (NULYTELY) 420 g solution Take 4,000 mLs by mouth as directed. 10/20/22  Yes  [provider]    Allergies as of 10/20/2022   (No Known Allergies)    Family History  Problem Relation Age of Onset   Prostate cancer Father        late 90s   Heart disease Maternal Aunt    Colon cancer Neg Hx    Liver disease Neg Hx     Social History   Socioeconomic History   Marital status: Married    Spouse name: Not on file   Number of children: Not on file   Years of education: Not on file   Highest education level: Not on file  Occupational History   Not on file  Tobacco Use   Smoking status: Every Day    Current packs/day: 0.75    Average packs/day: 0.8 packs/day for 35.0 years (26.3 ttl pk-yrs)    Types: Cigarettes   Smokeless tobacco: Former   Tobacco comments:    Currently smoking 1/4ppd as of 09/05/22  Vaping Use   Vaping status: Never Used  Substance and Sexual Activity   Alcohol use: Yes    Comment: 3-4, 12 oz beer per day.   Drug use: Yes    Types: Marijuana    Comment: last used 01/22/2018   Sexual activity: Not Currently  Other Topics Concern   Not on file  Social History Narrative   Not on file   Social Determinants of Health   Financial Resource Strain: Not on file  Food Insecurity: Unknown (12/11/2017)   Hunger Vital Sign  Worried About Programme researcher, broadcasting/film/video in the Last Year: Patient declined    Barista in the Last Year: Patient declined  Transportation Needs: Unknown (12/11/2017)   PRAPARE - Administrator, Civil Service (Medical): Patient declined    Lack of Transportation (Non-Medical): Patient declined  Physical Activity: Unknown (12/11/2017)   Exercise Vital Sign    Days of Exercise per Week: Patient declined    Minutes of Exercise per Session: Patient declined  Stress: No Stress Concern Present (12/11/2017)   [employer] of Occupational Health - Occupational Stress Questionnaire    Feeling of Stress : Not at all  Social Connections: Unknown (12/11/2017)   Social Connection and Isolation Panel  [NHANES]    Frequency of Communication with Friends and Family: Patient declined    Frequency of Social Gatherings with Friends and Family: Patient declined    Attends Religious Services: Patient declined    Database administrator or Organizations: Patient declined    Attends Banker Meetings: Patient declined    Marital Status: Patient declined  Intimate Partner Violence: Unknown (12/11/2017)   Humiliation, Afraid, Rape, and Kick questionnaire    Fear of Current or Ex-Partner: Patient declined    Emotionally Abused: Patient declined    Physically Abused: Patient declined    Sexually Abused: Patient declined    Review of Systems: See HPI, otherwise negative ROS  Physical Exam: BP 139/70   Pulse (!) 45   Temp 98.6 F (37 C) (Oral)   Resp 12   Ht 5\' 11"  (1.803 m)   Wt 67.1 kg   SpO2 98%   BMI 20.64 kg/m  General:   Alert,  Well-developed, well-nourished, pleasant and cooperative in NAD   Lungs:  Clear throughout to auscultation.   No wheezes, crackles, or rhonchi. No acute distress. Heart:  Regular rate and rhythm; no murmurs, clicks, rubs,  or gallops. Abdomen: Non-distended, normal bowel sounds.  Soft and nontender without appreciable mass or hepatosplenomegaly.  Pulses:  Normal pulses noted. Extremities:  Without clubbing or edema.  Impression/Plan: 62 year old gentleman a large cecal polyp piecemeal removed 2020; overdue for surveillance.  No bowel symptoms.  Colonoscopy today per plan.  The risks, benefits, limitations, alternatives and imponderables have been reviewed with the patient. Questions have been answered. All parties are agreeable.        NUTRITION Drink plenty of fluids.  You may resume your normal diet as instructed by your doctor.  Begin with a light meal and progress to your normal diet. Heavy or fried foods are harder to digest and may make you feel sick to your stomach (nauseated).  Avoid alcoholic beverages for 24 hours or as  instructed.  MEDICATIONS You may resume your normal medications unless your doctor tells you otherwise.  WHAT YOU CAN EXPECT TODAY Some feelings of bloating in the abdomen.  Passage of more gas than usual.  Spotting of blood in your stool or on the toilet paper.  IF YOU HAD POLYPS REMOVED DURING THE COLONOSCOPY: No aspirin products for 7 days or as instructed.  No alcohol for 7 days or as instructed.  Eat a soft diet for the next 24 hours.  FINDING OUT THE RESULTS OF YOUR TEST Not all test results are available during your visit. If your test results are not back during the visit, make an appointment with your caregiver to find out the results. Do not assume everything is normal if you have not heard from your caregiver  or the medical facility. It is important for you to follow up on all of your test results.  SEEK IMMEDIATE MEDICAL ATTENTION IF: You have more than a spotting of blood in your stool.  Your belly is swollen (abdominal distention).  You are nauseated or vomiting.  You have a temperature over 101.  You have abdominal pain or discomfort that is severe or gets worse throughout the day.       Notice: This dictation was prepared with Dragon dictation along with smaller phrase technology. Any transcriptional errors that result from this process are unintentional and may not be corrected upon review.

## 2022-11-30 NOTE — Anesthesia Procedure Notes (Signed)
Date/Time: 11/30/2022 7:31 AM  Performed by: Julian Reil, CRNAPre-anesthesia Checklist: Patient identified, Emergency Drugs available, Suction available and Patient being monitored Patient Re-evaluated:Patient Re-evaluated prior to induction Oxygen Delivery Method: Nasal cannula Induction Type: IV induction Placement Confirmation: positive ETCO2

## 2022-11-30 NOTE — Discharge Instructions (Addendum)
  Colonoscopy Discharge Instructions  Read the instructions outlined below and refer to this sheet in the next few weeks. These discharge instructions provide you with general information on caring for yourself after you leave the hospital. Your doctor may also give you specific instructions. While your treatment has been planned according to the most current medical practices available, unavoidable complications occasionally occur. If you have any problems or questions after discharge, call Dr. Jena Gauss at [phone]. ACTIVITY You may resume your regular activity, but move at a slower pace for the next 24 hours.  Take frequent rest periods for the next 24 hours.  Walking will help get rid of the air and reduce the bloated feeling in your belly (abdomen).  No driving for 24 hours (because of the medicine (anesthesia) used during the test).   Do not sign any important legal documents or operate any machinery for 24 hours (because of the anesthesia used during the test).  NUTRITION Drink plenty of fluids.  You may resume your normal diet as instructed by your doctor.  Begin with a light meal and progress to your normal diet. Heavy or fried foods are harder to digest and may make you feel sick to your stomach (nauseated).  Avoid alcoholic beverages for 24 hours or as instructed.  MEDICATIONS You may resume your normal medications unless your doctor tells you otherwise.  WHAT YOU CAN EXPECT TODAY Some feelings of bloating in the abdomen.  Passage of more gas than usual.  Spotting of blood in your stool or on the toilet paper.  IF YOU HAD POLYPS REMOVED DURING THE COLONOSCOPY: No aspirin products for 7 days or as instructed.  No alcohol for 7 days or as instructed.  Eat a soft diet for the next 24 hours.  FINDING OUT THE RESULTS OF YOUR TEST Not all test results are available during your visit. If your test results are not back during the visit, make an appointment with your caregiver to find out the  results. Do not assume everything is normal if you have not heard from your caregiver or the medical facility. It is important for you to follow up on all of your test results.  SEEK IMMEDIATE MEDICAL ATTENTION IF: You have more than a spotting of blood in your stool.  Your belly is swollen (abdominal distention).  You are nauseated or vomiting.  You have a temperature over 101.  You have abdominal pain or discomfort that is severe or gets worse throughout the day.    2 polyps removed from your colon today  Further recommendations to follow pending review of pathology report  At patient request, I called Nicholas Avila at [phone] -unable to reach

## 2022-12-01 LAB — SURGICAL PATHOLOGY

## 2022-12-06 ENCOUNTER — Encounter: Payer: Self-pay | Admitting: Internal Medicine

## 2022-12-08 ENCOUNTER — Encounter (HOSPITAL_COMMUNITY): Payer: Self-pay | Admitting: Internal Medicine

## 2023-02-26 ENCOUNTER — Encounter: Payer: Self-pay | Admitting: Gastroenterology

## 2023-04-17 ENCOUNTER — Other Ambulatory Visit: Payer: Self-pay | Admitting: *Deleted

## 2023-04-17 DIAGNOSIS — D649 Anemia, unspecified: Secondary | ICD-10-CM

## 2023-04-17 DIAGNOSIS — K7460 Unspecified cirrhosis of liver: Secondary | ICD-10-CM

## 2023-06-12 ENCOUNTER — Other Ambulatory Visit: Payer: Self-pay | Admitting: Neurosurgery

## 2023-06-12 DIAGNOSIS — M47812 Spondylosis without myelopathy or radiculopathy, cervical region: Secondary | ICD-10-CM

## 2023-06-18 ENCOUNTER — Telehealth: Payer: Self-pay | Admitting: Orthopaedic Surgery

## 2023-06-18 NOTE — Discharge Instructions (Signed)

## 2023-06-18 NOTE — Telephone Encounter (Signed)
Nicholas Avila said he didn't have to pay the fee for forms.  He states Dr. Hilda Lias filled them out last time.  He is VERY ADAMANT he is not going to pay the $25.00.  He thinks it is in 2023 when he had them done last time.  He has left the forms with [place].  If you can please call him back at [phone].  I told him, [name] told him and [name] told him it is a $25.00 charge to complete the forms out, and he said no to paying for the forms.

## 2023-06-19 ENCOUNTER — Ambulatory Visit
Admission: RE | Admit: 2023-06-19 | Discharge: 2023-06-19 | Disposition: A | Discharge status: Home / Self Care | Payer: BC Managed Care – PPO | Source: Ambulatory Visit | Attending: Neurosurgery | Admitting: Neurosurgery

## 2023-06-19 DIAGNOSIS — M47812 Spondylosis without myelopathy or radiculopathy, cervical region: Secondary | ICD-10-CM

## 2023-06-19 DIAGNOSIS — M4722 Other spondylosis with radiculopathy, cervical region: Secondary | ICD-10-CM | POA: Diagnosis not present

## 2023-06-19 MED ORDER — TRIAMCINOLONE ACETONIDE 40 MG/ML IJ SUSP (RADIOLOGY)
60.0000 mg | Freq: Once | INTRAMUSCULAR | Status: AC
  Administered 2023-06-19: 60 mg via EPIDURAL

## 2023-06-19 MED ORDER — IOPAMIDOL (ISOVUE-M 300) INJECTION 61%
1.0000 mL | Freq: Once | INTRAMUSCULAR | Status: AC | PRN
  Administered 2023-06-19: 1 mL via EPIDURAL

## 2023-06-29 DIAGNOSIS — D649 Anemia, unspecified: Secondary | ICD-10-CM | POA: Diagnosis not present

## 2023-06-29 DIAGNOSIS — K7460 Unspecified cirrhosis of liver: Secondary | ICD-10-CM | POA: Diagnosis not present

## 2023-06-30 LAB — CBC WITH DIFFERENTIAL/PLATELET
Basophils Absolute: 0.1 10*3/uL (ref 0.0–0.2)
Basos: 1 %
EOS (ABSOLUTE): 0.3 10*3/uL (ref 0.0–0.4)
Eos: 2 %
Hematocrit: 42.9 % (ref 37.5–51.0)
Hemoglobin: 13.9 g/dL (ref 13.0–17.7)
Immature Grans (Abs): 0 10*3/uL (ref 0.0–0.1)
Immature Granulocytes: 0 %
Lymphocytes Absolute: 3.9 10*3/uL — ABNORMAL HIGH (ref 0.7–3.1)
Lymphs: 30 %
MCH: 29.6 pg (ref 26.6–33.0)
MCHC: 32.4 g/dL (ref 31.5–35.7)
MCV: 92 fL (ref 79–97)
Monocytes Absolute: 0.9 10*3/uL (ref 0.1–0.9)
Monocytes: 7 %
Neutrophils Absolute: 7.7 10*3/uL — ABNORMAL HIGH (ref 1.4–7.0)
Neutrophils: 60 %
Platelets: 316 10*3/uL (ref 150–450)
RBC: 4.69 x10E6/uL (ref 4.14–5.80)
RDW: 14.2 % (ref 11.6–15.4)
WBC: 12.9 10*3/uL — ABNORMAL HIGH (ref 3.4–10.8)

## 2023-06-30 LAB — COMPREHENSIVE METABOLIC PANEL
ALT: 13 [IU]/L (ref 0–44)
AST: 19 [IU]/L (ref 0–40)
Albumin: 4.7 g/dL (ref 3.9–4.9)
Alkaline Phosphatase: 123 [IU]/L — ABNORMAL HIGH (ref 44–121)
BUN/Creatinine Ratio: 20 (ref 10–24)
BUN: 18 mg/dL (ref 8–27)
Bilirubin Total: 0.5 mg/dL (ref 0.0–1.2)
CO2: 24 mmol/L (ref 20–29)
Calcium: 9.3 mg/dL (ref 8.6–10.2)
Chloride: 101 mmol/L (ref 96–106)
Creatinine, Ser: 0.89 mg/dL (ref 0.76–1.27)
Globulin, Total: 2.6 g/dL (ref 1.5–4.5)
Glucose: 98 mg/dL (ref 70–99)
Potassium: 4.6 mmol/L (ref 3.5–5.2)
Sodium: 140 mmol/L (ref 134–144)
Total Protein: 7.3 g/dL (ref 6.0–8.5)
eGFR: 97 mL/min/{1.73_m2} (ref >59)

## 2023-06-30 LAB — PROTIME-INR
INR: 0.9 (ref 0.9–1.2)
Prothrombin Time: 10.7 s (ref 9.1–12.0)

## 2023-08-02 ENCOUNTER — Ambulatory Visit: Admitting: Orthopedic Surgery

## 2023-10-17 ENCOUNTER — Ambulatory Visit (INDEPENDENT_AMBULATORY_CARE_PROVIDER_SITE_OTHER): Admitting: Gastroenterology

## 2023-10-17 ENCOUNTER — Encounter: Payer: Self-pay | Admitting: Gastroenterology

## 2023-10-17 ENCOUNTER — Encounter: Payer: Self-pay | Admitting: *Deleted

## 2023-10-17 VITALS — BP 126/62 | HR 56 | Temp 97.7°F | Ht 71.0 in | Wt 130.2 lb

## 2023-10-17 DIAGNOSIS — K7460 Unspecified cirrhosis of liver: Secondary | ICD-10-CM

## 2023-10-17 DIAGNOSIS — Z860101 Personal history of adenomatous and serrated colon polyps: Secondary | ICD-10-CM

## 2023-10-17 DIAGNOSIS — F17210 Nicotine dependence, cigarettes, uncomplicated: Secondary | ICD-10-CM | POA: Diagnosis not present

## 2023-10-17 DIAGNOSIS — R10816 Epigastric abdominal tenderness: Secondary | ICD-10-CM

## 2023-10-17 DIAGNOSIS — F1291 Cannabis use, unspecified, in remission: Secondary | ICD-10-CM | POA: Diagnosis not present

## 2023-10-17 DIAGNOSIS — F1090 Alcohol use, unspecified, uncomplicated: Secondary | ICD-10-CM

## 2023-10-17 DIAGNOSIS — K7402 Hepatic fibrosis, advanced fibrosis: Secondary | ICD-10-CM

## 2023-10-17 NOTE — Progress Notes (Signed)
 GI Office Note    Referring Provider: Wyvonna Heidelberg* Primary Care Physician:  Fanta, Tesfaye Demissie, MD Primary Gastroenterologist: Windsor Hatcher.Rourk, MD  Date:  10/17/2023  ID:  Nicholas Avila, DOB [birth year], MRN [id number]   Chief Complaint   Chief Complaint  Patient presents with   Follow-up    Follow up. No problems    History of Present Illness  Nicholas Avila is a 63 y.o. male with a history of cirrhosis secondary to alcohol abuse, atrial flutter, tobacco use, pulmonary nodules, HTN, colon polyps, and anemia presenting today with complaint of   Colonoscopy 05/23/2018: -6 mm tubular adenoma in the descending colon -sessile hyperplastic polyp in cecum -internal hemorrhoids -advised repeat colonoscopy in 6 months   Seen for initial consultation in May [address]40 for elevated LFTs. Admitted to drinking about 3-4 (12oz) beers daily. Full workup ordered. Colonoscopy scheduled and patient cancelled   Labs May 2023: Hgb 10.2, AST, ALT, and alk phos improved. T.bili 0.9. Viral hepatitis panel negative. Iron and ferritin elevated. ANA, AMA negative. ASMA slightly positive. IgA and IgG elevated.   Abdominal U/S 10/17/21: hepatis steatosis, trace ascites, mildly dilated CBD. MRCP 11/01/21: no biliary ductal dilation, hepatomegaly with mild hepatic steatosis and trace ascites.   Hospitalized in July 2023 for diarrhea, chills, malaise, and fever. Found to have pneumonia and had elevated LFTs, increased bilirubin and IgG. Hemochromatosis DNA negative.    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 Autoimmune feature not present however overlapping autoimmune hepatitis cannot be ruled out. Hgb 13.2, platelets 393   Labs 03/28/22: IgG 1753 (slightly elevated but improved from prior), ASMA negative, normal LFTs  Last office visit May 2024. Ordered labs and US . Hold aldactone. Avoid NASIDs. [place]selefed on alcohol avoidance. Colonoscopy  ordered. Cirrhosis diet advised.  Colonoscopy July 2024:  - 5 mm polyp in ascending colon - 9mm polyp in proximal rectum - Path: Tubular adenomas without dysplasia. -Repeat in 5 years.     Latest Ref Rng & Units 06/29/2023    8:11 AM 10/11/2022    8:32 AM 03/28/2022   10:32 AM  CMP  Glucose 70 - 99 mg/dL 98  87  96   BUN 8 - 27 mg/dL 18  12  12    Creatinine 0.76 - 1.27 mg/dL 3.47  4.25  9.56   Sodium 134 - 144 mmol/L 140  139  139   Potassium 3.5 - 5.2 mmol/L 4.6  4.7  3.9   Chloride 96 - 106 mmol/L 101  100  103   CO2 20 - 29 mmol/L 24  24  28    Calcium  8.6 - 10.2 mg/dL 9.3  9.8  9.4   Total Protein 6.0 - 8.5 g/dL 7.3  7.6  7.6   Total Bilirubin 0.0 - 1.2 mg/dL 0.5  0.4  0.3   Alkaline Phos 44 - 121 IU/L 123  140    AST 0 - 40 IU/L 19  21  21    ALT 0 - 44 IU/L 13  14  12      Today:   Cirrhosis history Hematemesis/coffee ground emesis: None History of variceal bleeding: No melena or brbpr.  Abdominal pain: None Abdominal distention/worsening ascites: None Fever/chills: None Episodes of confusion/disorientation: None Number of daily bowel movements: 2-3 /day - depends on intake.  Taking diuretics?:  None Date of last EGD: Never-no evidence of thrombocytopenia or portal hypertension at this time. Prior history of banding?:  None Prior episodes of SBP:  None Last time liver imaging was performed:May 2024 - no hepatoma. kPa16.8. No ascites.   Tobacco use -  1/4-1/2 ppd. Picked it up the first of this year.   Arthritis and pain - at times not eating and much and then decreased his appetite. Does more snsacking than big meals.eats lots of honey buns - cookies. He does eat a lot of chicken. He works at night and does not usually eat until night. He takes sandwiches and eats something with substance during brakes at work. Used to eat 3 times a day but he was lifting weights etc at that time as well. Here and there ehe will dirnk a boost and ensure here and there. He is tacking  diclofenac  for his arthritis daily. Having muscle cramps at night sometimes.   MELD 3.0: 6 at 06/29/2023  8:15 AM MELD-Na: 6 at 06/29/2023  8:15 AM Calculated from: Serum Creatinine: 0.89 mg/dL (Using min of 1 mg/dL) at 5/78/4696  2:95 AM Serum Sodium: 140 mmol/L (Using max of 137 mmol/L) at 06/29/2023  8:11 AM Total Bilirubin: 0.5 mg/dL (Using min of 1 mg/dL) at 2/84/1324  4:01 AM Serum Albumin: 4.7 g/dL (Using max of 3.5 g/dL) at 0/27/2536  6:44 AM INR(ratio): 0.9 (Using min of 1) at 06/29/2023  8:15 AM Age at listing (hypothetical): 52 years Sex: Male at 06/29/2023  8:15 AM    Wt Readings from Last 3 Encounters:  10/17/23 130 lb 3.2 oz (59.1 kg)  11/30/22 148 lb (67.1 kg)  10/05/22 133 lb 12.8 oz (60.7 kg)    Current Outpatient Medications  Medication Sig Dispense Refill   amLODipine (NORVASC) 10 MG tablet Take 10 mg by mouth 2 (two) times a week.     carboxymethylcellulose (REFRESH PLUS) 0.5 % SOLN Place 1 drop into both eyes every evening.     diclofenac  (CATAFLAM) 50 MG tablet Take 50 mg by mouth daily as needed (pain.).     naproxen  sodium (ALEVE ) 220 MG tablet Take 220 mg by mouth 2 (two) times daily as needed (toothache/pain.).     polyethylene glycol-electrolytes (NULYTELY) 420 g solution Take 4,000 mLs by mouth as directed. (Patient not taking: Reported on 10/17/2023)     No current facility-administered medications for this visit.    Past Medical History:  Diagnosis Date   Alcoholism in remission (HCC)    quit 12/10/17. longest time alcohol free. reports multiple previous DUIs (04/08/18)   Arthritis    Atrial flutter (HCC)    a. diagnosed in 11/2017 during admission for Sepsis b. s/p successful DCCV in 01/2018.   Dysrhythmia    AFib   Pulmonary nodules    Tobacco use     Past Surgical History:  Procedure Laterality Date   CARDIOVERSION N/A 01/24/2018   Procedure: CARDIOVERSION;  Surgeon: Laurann Pollock, MD;  [facility];  Service: Endoscopy;   Laterality: N/A;   COLONOSCOPY WITH PROPOFOL  N/A 05/23/2018   Procedure: COLONOSCOPY WITH PROPOFOL ;  Surgeon: Suzette Espy, MD;  [facility];  Service: Endoscopy;  Laterality: N/A;  8:30am   COLONOSCOPY WITH PROPOFOL  N/A 11/30/2022   Procedure: COLONOSCOPY WITH PROPOFOL ;  Surgeon: Suzette Espy, MD;  [facility];  Service: Endoscopy;  Laterality: N/A;  730am, asa 2   POLYPECTOMY  05/23/2018   Procedure: POLYPECTOMY;  Surgeon: Suzette Espy, MD;  [facility];  Service: Endoscopy;;  cecal polyp, descending polyp   POLYPECTOMY  11/30/2022   Procedure: POLYPECTOMY;  Surgeon: Riley Cheadle,  Windsor Hatcher, MD;  [facility];  Service: Endoscopy;;  hot and cold snare    Family History  Problem Relation Age of Onset   Prostate cancer Father        late 31s   Heart disease Maternal Aunt    Colon cancer Neg Hx    Liver disease Neg Hx     Allergies as of 10/17/2023   (No Known Allergies)    Social History   Socioeconomic History   Marital status: Married    Spouse name: Not on file   Number of children: Not on file   Years of education: Not on file   Highest education level: Not on file  Occupational History   Not on file  Tobacco Use   Smoking status: Every Day    Current packs/day: 0.75    Average packs/day: 0.8 packs/day for 35.0 years (26.3 ttl pk-yrs)    Types: Cigarettes   Smokeless tobacco: Former   Tobacco comments:    Currently smoking 1/4ppd as of 09/05/22  Vaping Use   Vaping status: Never Used  Substance and Sexual Activity   Alcohol use: Yes    Comment: 3-4, 12 oz beer per day.   Drug use: Yes    Types: Marijuana    Comment: last used 01/22/2018   Sexual activity: Not Currently  Other Topics Concern   Not on file  Social History Narrative   Not on file   Social Drivers of Health   Financial Resource Strain: Not on file  Food Insecurity: Unknown (12/11/2017)   Hunger Vital Sign    Worried About Running Out of Food in the  Last Year: Patient declined    Ran Out of Food in the Last Year: Patient declined  Transportation Needs: Unknown (12/11/2017)   PRAPARE - Administrator, Civil Service (Medical): Patient declined    Lack of Transportation (Non-Medical): Patient declined  Physical Activity: Unknown (12/11/2017)   Exercise Vital Sign    Days of Exercise per Week: Patient declined    Minutes of Exercise per Session: Patient declined  Stress: No Stress Concern Present (12/11/2017)   [employer] of Occupational Health - Occupational Stress Questionnaire    Feeling of Stress : Not at all  Social Connections: Unknown (12/11/2017)   Social Connection and Isolation Panel [NHANES]    Frequency of Communication with Friends and Family: Patient declined    Frequency of Social Gatherings with Friends and Family: Patient declined    Attends Religious Services: Patient declined    Database administrator or Organizations: Patient declined    Attends Banker Meetings: Patient declined    Marital Status: Patient declined   Review of Systems   Gen: Denies fever, chills, anorexia. Denies fatigue, weakness, weight loss.  CV: Denies chest pain, palpitations, syncope, peripheral edema, and claudication. Resp: Denies dyspnea at rest, cough, wheezing, coughing up blood, and pleurisy. GI: See HPI Derm: Denies rash, itching, dry skin Psych: Denies depression, anxiety, memory loss, confusion. No homicidal or suicidal ideation.  Heme: Denies bruising, bleeding, and enlarged lymph nodes.  Physical Exam   BP 126/62 (BP Location: Right Arm, Patient Position: Sitting, Cuff Size: Large)   Pulse (!) 56   Temp 97.7 F (36.5 C) (Temporal)   Ht 5\' 11"  (1.803 m)   Wt 130 lb 3.2 oz (59.1 kg)   BMI 18.16 kg/m   General:   Alert and oriented. No distress noted. Pleasant and cooperative.  Head:  Normocephalic and atraumatic. Eyes:  Conjuctiva clear without scleral icterus. Mouth:  Oral mucosa pink and  moist. Good dentition. No lesions. Lungs:  Clear to auscultation bilaterally. No wheezes, rales, or rhonchi. No distress.  Heart:  S1, S2 present without murmurs appreciated.  Abdomen:  +BS, soft, non-distended.  Mild TTP to epigastrium no rebound or guarding. No HSM or masses noted. Rectal: Deferred Msk:  Symmetrical without gross deformities. Normal posture. Extremities:  Without edema. Neurologic:  Alert and  oriented x4 Psych:  Alert and cooperative. Normal mood and affect.  Assessment  Nicholas Avila is a 63 y.o. male with a history of cirrhosis secondary to alcohol abuse, atrial flutter, tobacco use, pulmonary nodules, HTN, colon polyps, and anemia presenting  presenting today for cirrhosis follow-up.  Cirrhosis, early: Remains well compensated at this time.  No evidence of hepatic encephalopathy, pruritus, jaundice.  Overdue for hepatoma screening.  Last imaging in May 2024 without evidence of ascites or hepatoma.  History of alcohol use, was previously admitted to 3-412 ounce beers daily.  Has reported occasional marijuana use.  Since his last visit he had remained tobacco free for about 7 months however recently started back the first of the year.  We have previously discussed avoidance of NSAIDs however given his arthritis he has been on diclofenac  once daily for pain relief.  Currently without any significant epigastric complaints or heartburn/indigestion.  Mildly tender to the epigastric region today.  Initially he gained some weight after his last visit however has gone back to his prior weight of 130 pounds.  Not following low-sodium diet, provided education today.  Will order repeat MELD labs as well as add on AFP for with baseline/monitoring and repeat ultrasound for hepatoma screening.  Discussed importance of protein intake today, advised to get back on doing his protein shakes and/or Ensure/boost at least once daily.  Tubular adenomas found on most recent colonoscopy in 2024.   Recommended 5-year repeat, should be on recall for procedure in 2029.  PLAN   CBC,CMP, INR, AFP RUQ US  Limit NSAID use - be mindful of diclfenac If develops epigastric pain or heartburn/indigestion, would recommend starting Pepcid versus omeprazole. 2g sodium diet -separate written education provided Avoid alcohol Discussed importance on smoking cessation Limit NSAID use Protein shake daily Colonoscopy 2029. Follow up in 6 months    Julian Obey, MSN, FNP-BC, AGACNP-BC [facility]

## 2023-10-17 NOTE — Patient Instructions (Addendum)
 We will get your scheduled for US  in the near future. You may complete your labs when you do you ultrasound  Cirrhosis Lifestyle Recommendations:  High-protein diet from a primarily plant-based diet. Avoid red meat.  No raw or undercooked meat, seafood, or shellfish. Low-fat/cholesterol/carbohydrate diet. Limit sodium to no more than 2000 mg/day including everything that you eat and drink. Recommend at least 30 minutes of aerobic and resistance exercise 3 days/week. Limit Tylenol  to 2000 mg daily.   I want you to get back to drinkign a protein shake daily wether that be boost, ensure, and smoothies/protein shakes you make at home.   Please try to work toward smoking cessation again.   If you begin to have more frequent pain in the upper abdomen please let me know.   Follow up in 6 months.   It was a pleasure to see you today. I want to create trusting relationships with patients. If you receive a survey regarding your visit,  I greatly appreciate you taking time to fill this out on paper or through your MyChart. I value your feedback.  Julian Obey, MSN, FNP-BC, AGACNP-BC [facility]

## 2023-10-22 ENCOUNTER — Telehealth: Payer: Self-pay | Admitting: *Deleted

## 2023-10-22 ENCOUNTER — Other Ambulatory Visit: Payer: Self-pay | Admitting: Gastroenterology

## 2023-10-22 MED ORDER — OMEPRAZOLE MAGNESIUM 20 MG PO TBEC: 20.0000 mg | DELAYED_RELEASE_TABLET | Freq: Every day | ORAL | 1 refills | Status: DC

## 2023-10-22 NOTE — Telephone Encounter (Signed)
 Spoke to pt, it was omeprazole 20mg 

## 2023-10-22 NOTE — Telephone Encounter (Signed)
 Pt called and states that his stomach has been hurting for the last couple of days. He states she drank a smoothie and it started after that. He took one of his wife's acid reflux pills and started to feel better.

## 2023-10-23 ENCOUNTER — Other Ambulatory Visit: Payer: Self-pay | Admitting: Gastroenterology

## 2023-10-23 ENCOUNTER — Encounter: Payer: Self-pay | Admitting: *Deleted

## 2023-10-23 MED ORDER — OMEPRAZOLE MAGNESIUM 20 MG PO TBEC: 20.0000 mg | DELAYED_RELEASE_TABLET | Freq: Every day | ORAL | 1 refills | Status: AC

## 2023-10-24 ENCOUNTER — Ambulatory Visit (HOSPITAL_COMMUNITY)
Admission: RE | Admit: 2023-10-24 | Discharge: 2023-10-24 | Disposition: A | Discharge status: Home / Self Care | Source: Ambulatory Visit | Attending: Gastroenterology | Admitting: Gastroenterology

## 2023-10-24 ENCOUNTER — Other Ambulatory Visit (HOSPITAL_COMMUNITY)
Admission: RE | Admit: 2023-10-24 | Discharge: 2023-10-24 | Disposition: A | Discharge status: Home / Self Care | Source: Ambulatory Visit | Attending: Gastroenterology | Admitting: Gastroenterology

## 2023-10-24 ENCOUNTER — Ambulatory Visit: Payer: Self-pay | Admitting: Gastroenterology

## 2023-10-24 DIAGNOSIS — K7460 Unspecified cirrhosis of liver: Secondary | ICD-10-CM | POA: Insufficient documentation

## 2023-10-24 LAB — COMPREHENSIVE METABOLIC PANEL WITH GFR
ALT: 9 U/L (ref 0–44)
AST: 15 U/L (ref 15–41)
Albumin: 3.8 g/dL (ref 3.5–5.0)
Alkaline Phosphatase: 76 U/L (ref 38–126)
Anion gap: 11 (ref 5–15)
BUN: 6 mg/dL — ABNORMAL LOW (ref 8–23)
CO2: 26 mmol/L (ref 22–32)
Calcium: 8.9 mg/dL (ref 8.9–10.3)
Chloride: 103 mmol/L (ref 98–111)
Creatinine, Ser: 0.8 mg/dL (ref 0.61–1.24)
GFR, Estimated: >60 mL/min (ref >60)
Glucose, Bld: 94 mg/dL (ref 70–99)
Potassium: 4.3 mmol/L (ref 3.5–5.1)
Sodium: 140 mmol/L (ref 135–145)
Total Bilirubin: 0.5 mg/dL (ref 0.0–1.2)
Total Protein: 7.6 g/dL (ref 6.5–8.1)

## 2023-10-24 LAB — CBC
HCT: 43.5 % (ref 39.0–52.0)
Hemoglobin: 14.3 g/dL (ref 13.0–17.0)
MCH: 29.4 pg (ref 26.0–34.0)
MCHC: 32.9 g/dL (ref 30.0–36.0)
MCV: 89.3 fL (ref 80.0–100.0)
Platelets: 343 10*3/uL (ref 150–400)
RBC: 4.87 MIL/uL (ref 4.22–5.81)
RDW: 13.4 % (ref 11.5–15.5)
WBC: 10.1 10*3/uL (ref 4.0–10.5)
nRBC: 0 % (ref 0.0–0.2)

## 2023-10-24 LAB — PROTIME-INR
INR: 1 (ref 0.8–1.2)
Prothrombin Time: 13.2 s (ref 11.4–15.2)

## 2023-10-25 ENCOUNTER — Ambulatory Visit: Payer: Self-pay | Admitting: Gastroenterology

## 2023-10-25 LAB — AFP TUMOR MARKER: AFP, Serum, Tumor Marker: 2.9 ng/mL (ref 0.0–8.4)

## 2024-01-04 ENCOUNTER — Encounter: Payer: Self-pay | Admitting: Radiology

## 2024-03-17 ENCOUNTER — Encounter: Payer: Self-pay | Admitting: Radiology

## 2024-04-08 ENCOUNTER — Encounter: Payer: Self-pay | Admitting: Gastroenterology

## 2024-04-21 ENCOUNTER — Telehealth: Payer: Self-pay | Admitting: *Deleted

## 2024-04-21 NOTE — Telephone Encounter (Signed)
 Pt needs to make a 6 month follow up OV with Charmaine HERO, NP

## 2024-04-22 NOTE — Telephone Encounter (Signed)
 Thank you :)

## 2024-04-23 NOTE — Telephone Encounter (Signed)
 Appt scheduled, called and left a message asking pt to call the office to confirm the appt.

## 2024-06-15 ENCOUNTER — Encounter (INDEPENDENT_AMBULATORY_CARE_PROVIDER_SITE_OTHER): Payer: Self-pay

## 2024-06-16 ENCOUNTER — Ambulatory Visit: Admitting: Gastroenterology

## 2024-09-12 ENCOUNTER — Ambulatory Visit: Payer: Self-pay
